# Patient Record
Sex: Female | Born: 1985 | Race: Black or African American | Hispanic: No | Marital: Single | Smoking: Former smoker
Health system: Southern US, Community
[De-identification: ages and names within clinical notes are randomized; demographics above are authoritative.]

## PROBLEM LIST (undated history)

## (undated) ENCOUNTER — Inpatient Hospital Stay (HOSPITAL_COMMUNITY): Payer: Self-pay

## (undated) DIAGNOSIS — R51 Headache: Secondary | ICD-10-CM

## (undated) DIAGNOSIS — I10 Essential (primary) hypertension: Secondary | ICD-10-CM

## (undated) DIAGNOSIS — E119 Type 2 diabetes mellitus without complications: Secondary | ICD-10-CM

## (undated) DIAGNOSIS — G4730 Sleep apnea, unspecified: Secondary | ICD-10-CM

## (undated) DIAGNOSIS — Z789 Other specified health status: Secondary | ICD-10-CM

## (undated) DIAGNOSIS — K8020 Calculus of gallbladder without cholecystitis without obstruction: Secondary | ICD-10-CM

## (undated) DIAGNOSIS — I82409 Acute embolism and thrombosis of unspecified deep veins of unspecified lower extremity: Secondary | ICD-10-CM

## (undated) HISTORY — DX: Type 2 diabetes mellitus without complications: E11.9

## (undated) HISTORY — DX: Essential (primary) hypertension: I10

---
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
## 2006-07-24 ENCOUNTER — Emergency Department (HOSPITAL_COMMUNITY): Admission: EM | Admit: 2006-07-24 | Discharge: 2006-07-24 | Payer: Self-pay | Admitting: Family Medicine

## 2010-10-19 ENCOUNTER — Inpatient Hospital Stay (INDEPENDENT_AMBULATORY_CARE_PROVIDER_SITE_OTHER)
Admission: RE | Admit: 2010-10-19 | Discharge: 2010-10-19 | Disposition: A | Discharge status: Home / Self Care | Payer: Self-pay | Source: Ambulatory Visit | Attending: Family Medicine | Admitting: Family Medicine

## 2010-10-19 DIAGNOSIS — J020 Streptococcal pharyngitis: Secondary | ICD-10-CM

## 2010-10-19 LAB — POCT RAPID STREP A: Streptococcus, Group A Screen (Direct): POSITIVE — AB

## 2011-06-28 ENCOUNTER — Inpatient Hospital Stay (HOSPITAL_COMMUNITY)
Admission: AD | Admit: 2011-06-28 | Discharge: 2011-06-28 | Disposition: A | Discharge status: Home / Self Care | Payer: Medicaid Other | Source: Ambulatory Visit | Attending: Obstetrics & Gynecology | Admitting: Obstetrics & Gynecology

## 2011-06-28 ENCOUNTER — Encounter (HOSPITAL_COMMUNITY): Payer: Self-pay

## 2011-06-28 DIAGNOSIS — Z331 Pregnant state, incidental: Secondary | ICD-10-CM

## 2011-06-28 DIAGNOSIS — Z3201 Encounter for pregnancy test, result positive: Secondary | ICD-10-CM | POA: Insufficient documentation

## 2011-06-28 DIAGNOSIS — Z3490 Encounter for supervision of normal pregnancy, unspecified, unspecified trimester: Secondary | ICD-10-CM

## 2011-06-28 HISTORY — DX: Other specified health status: Z78.9

## 2011-06-28 LAB — POCT PREGNANCY, URINE: Preg Test, Ur: POSITIVE — AB

## 2011-06-28 NOTE — MAU Provider Note (Signed)
  History     CSN: [id number]  Arrival date and time: 06/28/11 7829   None     Chief Complaint  Patient presents with  . Needs pregnancy test    HPI Pt presents for verification of pregnancy.  Pt is not having any spotting, bleeding or cramping or pregnancy complaints  Past Medical History  Diagnosis Date  . No pertinent past medical history     Past Surgical History  Procedure Date  . No past surgeries     Family History  Problem Relation Age of Onset  . Anesthesia problems Neg Hx   . Hypotension Neg Hx   . Malignant hyperthermia Neg Hx   . Pseudochol deficiency Neg Hx     History  Substance Use Topics  . Smoking status: Never Smoker   . Smokeless tobacco: Never Used  . Alcohol Use: No    Allergies: No Known Allergies  No prescriptions prior to admission    ROS Physical Exam   Blood pressure 139/80, pulse 87, temperature 98.7 F (37.1 C), temperature source Oral, resp. rate 16, height 5' 11.5" (1.816 m), weight 325 lb (147.419 kg), last menstrual period 05/12/2011, SpO2 100.00%.  Physical Exam  MAU Course  Procedures     Assessment and Plan  Pregnancy verification [redacted]w[redacted]d F/u OB care  Letasha Kershaw 06/28/2011, 8:15 AM

## 2011-06-28 NOTE — MAU Note (Signed)
Pt states LMP-05/12/2011, had +upt Saturday and Sunday. Denies vaginal d/c changes or bleeding. Needs pregnancy confirmation letter only.

## 2011-06-28 NOTE — Discharge Instructions (Signed)
________________________________________     To schedule your Maternity Eligibility Appointment, please call [phone].  When you arrive for your appointment you must bring the following items or information listed below.  Your appointment will be rescheduled if you do not have these items or are 15 minutes late. If currently receiving Medicaid, you MUST bring: 1. Medicaid Card 2. Social Security Card 3. Picture ID 4. Proof of Pregnancy 5. Verification of current address if the address on Medicaid card is incorrect "postmarked mail" If not receiving Medicaid, you MUST bring: 1. Social Security Card 2. Picture ID 3. Birth Certificate (if available) Passport or *Green Card 4. Proof of Pregnancy 5. Verification of current address "postmarked mail" for each income presented. 6. Verification of insurance coverage, if any 7. Check stubs from each employer for the previous month (if unable to present check stub  for each week, we will accept check stub for the first and last week ill the same month.) If you can't locate check stubs, you must bring a letter from the employer(s) and it must have the following information on letterhead, typed, in English: o name of company o company telephone number o how long been with the company, if less than one month o how much person earns per hour o how many hours per week work o the gross pay the person earned for the previous month If you are 27 years old or less, you do not have to bring proof of income unless you work or live with the father of the baby and at that time we will need proof of income from you and/or the father of the baby. Green Card recipients are eligible for Medicaid for Pregnant Women (MPW)  ABCs of Pregnancy A Antepartum care is very important. Be sure you see your doctor and get prenatal care as soon as you think you are pregnant. At this time, you will be tested for infection, genetic abnormalities and potential problems  with you and the pregnancy. This is the time to discuss diet, exercise, work, medications, labor, pain medication during labor and the possibility of a cesarean delivery. Ask any questions that may concern you. It is important to see your doctor regularly throughout your pregnancy. Avoid exposure to toxic substances and chemicals - such as cleaning solvents, lead and mercury, some insecticides, and paint. Pregnant women should avoid exposure to paint fumes, and fumes that cause you to feel ill, dizzy or faint. When possible, it is a good idea to have a pre-pregnancy consultation with your caregiver to begin some important recommendations your caregiver suggests such as, taking folic acid, exercising, quitting smoking, avoiding alcoholic beverages, etc. B Breastfeeding is the healthiest choice for both you and your baby. It has many nutritional benefits for the baby and health benefits for the mother. It also creates a very tight and loving bond between the baby and mother. Talk to your doctor, your family and friends, and your employer about how you choose to feed your baby and how they can support you in your decision. Not all birth defects can be prevented, but a woman can take actions that may increase her chance of having a healthy baby. Many birth defects happen very early in pregnancy, sometimes before a woman even knows she is pregnant. Birth defects or abnormalities of any child in your or the father's family should be discussed with your caregiver. Get a good support bra as your breast size changes. Wear it especially when you exercise and  when nursing.  C Celebrate the news of your pregnancy with the your spouse/father and family. Childbirth classes are helpful to take for you and the spouse/father because it helps to understand what happens during the pregnancy, labor and delivery. Cesarean delivery should be discussed with your doctor so you are prepared for that possibility. The pros and cons of  circumcision if it is a boy, should be discussed with your pediatrician. Cigarette smoking during pregnancy can result in low birth weight babies. It has been associated with infertility, miscarriages, tubal pregnancies, infant death (mortality) and poor health (morbidity) in childhood. Additionally, cigarette smoking may cause long-term learning disabilities. If you smoke, you should try to quit before getting pregnant and not smoke during the pregnancy. Secondary smoke may also harm a mother and her developing baby. It is a good idea to ask people to stop smoking around you during your pregnancy and after the baby is born. Extra calcium is necessary when you are pregnant and is found in your prenatal vitamin, in dairy products, green leafy vegetables and in calcium supplements. D A healthy diet according to your current weight and height, along with vitamins and mineral supplements should be discussed with your caregiver. Domestic abuse or violence should be made known to your doctor right away to get the situation corrected. Drink more water when you exercise to keep hydrated. Discomfort of your back and legs usually develops and progresses from the middle of the second trimester through to delivery of the baby. This is because of the enlarging baby and uterus, which may also affect your balance. Do not take illegal drugs. Illegal drugs can seriously harm the baby and you. Drink extra fluids (water is best) throughout pregnancy to help your body keep up with the increases in your blood volume. Drink at least 6 to 8 glasses of water, fruit juice, or milk each day. A good way to know you are drinking enough fluid is when your urine looks almost like clear water or is very light yellow.  E Eat healthy to get the nutrients you and your unborn baby need. Your meals should include the five basic food groups. Exercise (30 minutes of light to moderate exercise a day) is important and encouraged during pregnancy, if  there are no medical problems or problems with the pregnancy. Exercise that causes discomfort or dizziness should be stopped and reported to your caregiver. Emotions during pregnancy can change from being ecstatic to depression and should be understood by you, your partner and your family. F Fetal screening with ultrasound, amniocentesis and monitoring during pregnancy and labor is common and sometimes necessary. Take 400 micrograms of folic acid daily both before, when possible, and during the first few months of pregnancy to reduce the risk of birth defects of the brain and spine. All women who could possibly become pregnant should take a vitamin with folic acid, every day. It is also important to eat a healthy diet with fortified foods (enriched grain products, including cereals, rice, breads, and pastas) and foods with natural sources of folate (orange juice, green leafy vegetables, beans, peanuts, broccoli, asparagus, peas, and lentils). The father should be involved with all aspects of the pregnancy including, the prenatal care, childbirth classes, labor, delivery, and postpartum time. Fathers may also have emotional concerns about being a father, financial needs, and raising a family. G Genetic testing should be done appropriately. It is important to know your family and the father's history. If there have been problems with pregnancies  or birth defects in your family, report these to your doctor. Also, genetic counselors can talk with you about the information you might need in making decisions about having a family. You can call a major medical center in your area for help in finding a board-certified genetic counselor. Genetic testing and counseling should be done before pregnancy when possible, especially if there is a history of problems in the mother's or father's family. Certain ethnic backgrounds are more at risk for genetic defects. H Get familiar with the hospital where you will be having your  baby. Get to know how long it takes to get there, the labor and delivery area, and the hospital procedures. Be sure your medical insurance is accepted there. Get your home ready for the baby including, clothes, the baby's room (when possible), furniture and car seat. Hand washing is important throughout the day, especially after handling raw meat and poultry, changing the baby's diaper or using the bathroom. This can help prevent the spread of many bacteria and viruses that cause infection. Your hair may become dry and thinner, but will return to normal a few weeks after the baby is born. Heartburn is a common problem that can be treated by taking antacids recommended by your caregiver, eating smaller meals 5 or 6 times a day, not drinking liquids when eating, drinking between meals and raising the head of your bed 2 to 3 inches. I Insurance to cover you, the baby, doctor and hospital should be reviewed so that you will be prepared to pay any costs not covered by your insurance plan. If you do not have medical insurance, there are usually clinics and services available for you in your community. Take 30 milligrams of iron during your pregnancy as prescribed by your doctor to reduce the risk of low red blood cells (anemia) later in pregnancy. All women of childbearing age should eat a diet rich in iron. J There should be a joint effort for the mother, father and any other children to adapt to the pregnancy financially, emotionally, and psychologically during the pregnancy. Join a support group for moms-to-be. Or, join a class on parenting or childbirth. Have the family participate when possible. K Know your limits. Let your caregiver know if you experience any of the following:   Pain of any kind.   Strong cramps.   You develop a lot of weight in a short period of time (5 pounds in 3 to 5 days).   Vaginal bleeding, leaking of amniotic fluid.   Headache, vision problems.   Dizziness, fainting,  shortness of breath.   Chest pain.   Fever of 102 F (38.9 C) or higher.   Gush of clear fluid from your vagina.   Painful urination.   Domestic violence.   Irregular heartbeat (palpitations).   Rapid beating of the heart (tachycardia).   Constant feeling sick to your stomach (nauseous) and vomiting.   Trouble walking, fluid retention (edema).   Muscle weakness.   If your baby has decreased activity.   Persistent diarrhea.   Abnormal vaginal discharge.   Uterine contractions at 20-minute intervals.   Back pain that travels down your leg.  L Learn and practice that what you eat and drink should be in moderation and healthy for you and your baby. Legal drugs such as alcohol and caffeine are important issues for pregnant women. There is no safe amount of alcohol a woman can drink while pregnant. Fetal alcohol syndrome, a disorder characterized by growth retardation, facial  abnormalities, and central nervous system dysfunction, is caused by a woman's use of alcohol during pregnancy. Caffeine, found in tea, coffee, soft drinks and chocolate, should also be limited. Be sure to read labels when trying to cut down on caffeine during pregnancy. More than 200 foods, beverages, and over-the-counter medications contain caffeine and have a high salt content! There are coffees and teas that do not contain caffeine. M Medical conditions such as diabetes, epilepsy, and high blood pressure should be treated and kept under control before pregnancy when possible, but especially during pregnancy. Ask your caregiver about any medications that may need to be changed or adjusted during pregnancy. If you are currently taking any medications, ask your caregiver if it is safe to take them while you are pregnant or before getting pregnant when possible. Also, be sure to discuss any herbs or vitamins you are taking. They are medicines, too! Discuss with your doctor all medications, prescribed and  over-the-counter, that you are taking. During your prenatal visit, discuss the medications your doctor may give you during labor and delivery. N Never be afraid to ask your doctor or caregiver questions about your health, the progress of the pregnancy, family problems, stressful situations, and recommendation for a pediatrician, if you do not have one. It is better to take all precautions and discuss any questions or concerns you may have during your office visits. It is a good idea to write down your questions before you visit the doctor. O Over-the-counter cough and cold remedies may contain alcohol or other ingredients that should be avoided during pregnancy. Ask your caregiver about prescription, herbs or over-the-counter medications that you are taking or may consider taking while pregnant.  P Physical activity during pregnancy can benefit both you and your baby by lessening discomfort and fatigue, providing a sense of well-being, and increasing the likelihood of early recovery after delivery. Light to moderate exercise during pregnancy strengthens the belly (abdominal) and back muscles. This helps improve posture. Practicing yoga, walking, swimming, and cycling on a stationary bicycle are usually safe exercises for pregnant women. Avoid scuba diving, exercise at high altitudes (over 3000 feet), skiing, horseback riding, contact sports, etc. Always check with your doctor before beginning any kind of exercise, especially during pregnancy and especially if you did not exercise before getting pregnant. Q Queasiness, stomach upset and morning sickness are common during pregnancy. Eating a couple of crackers or dry toast before getting out of bed. Foods that you normally love may make you feel sick to your stomach. You may need to substitute other nutritious foods. Eating 5 or 6 small meals a day instead of 3 large ones may make you feel better. Do not drink with your meals, drink between meals. Questions  that you have should be written down and asked during your prenatal visits. R Read about and make plans to baby-proof your home. There are important tips for making your home a safer environment for your baby. Review the tips and make your home safer for you and your baby. Read food labels regarding calories, salt and fat content in the food. S Saunas, hot tubs, and steam rooms should be avoided while you are pregnant. Excessive high heat may be harmful during your pregnancy. Your caregiver will screen and examine you for sexually transmitted diseases and genetic disorders during your prenatal visits. Learn the signs of labor. Sexual relations while pregnant is safe unless there is a medical or pregnancy problem and your caregiver advises against it. T Traveling  long distances should be avoided especially in the third trimester of your pregnancy. If you do have to travel out of state, be sure to take a copy of your medical records and medical insurance plan with you. You should not travel long distances without seeing your doctor first. Most airlines will not allow you to travel after 36 weeks of pregnancy. Toxoplasmosis is an infection caused by a parasite that can seriously harm an unborn baby. Avoid eating undercooked meat and handling cat litter. Be sure to wear gloves when gardening. Tingling of the hands and fingers is not unusual and is due to fluid retention. This will go away after the baby is born. U Womb (uterus) size increases during the first trimester. Your kidneys will begin to function more efficiently. This may cause you to feel the need to urinate more often. You may also leak urine when sneezing, coughing or laughing. This is due to the growing uterus pressing against your bladder, which lies directly in front of and slightly under the uterus during the first few months of pregnancy. If you experience burning along with frequency of urination or bloody urine, be sure to tell your doctor.  The size of your uterus in the third trimester may cause a problem with your balance. It is advisable to maintain good posture and avoid wearing high heels during this time. An ultrasound of your baby may be necessary during your pregnancy and is safe for you and your baby. V Vaccinations are an important concern for pregnant women. Get needed vaccines before pregnancy. Center for Disease Control ([employer]) has clear guidelines for the use of vaccines during pregnancy. Review the list, be sure to discuss it with your doctor. Prenatal vitamins are helpful and healthy for you and the baby. Do not take extra vitamins except what is recommended. Taking too much of certain vitamins can cause overdose problems. Continuous vomiting should be reported to your caregiver. Varicose veins may appear especially if there is a family history of varicose veins. They should subside after the delivery of the baby. Support hose helps if there is leg discomfort. W Being overweight or underweight during pregnancy may cause problems. Try to get within 15 pounds of your ideal weight before pregnancy. Remember, pregnancy is not a time to be dieting! Do not stop eating or start skipping meals as your weight increases. Both you and your baby need the calories and nutrition you receive from a healthy diet. Be sure to consult with your doctor about your diet. There is a formula and diet plan available depending on whether you are overweight or underweight. Your caregiver or nutritionist can help and advise you if necessary. X Avoid X-rays. If you must have dental work or diagnostic tests, tell your dentist or physician that you are pregnant so that extra care can be taken. X-rays should only be taken when the risks of not taking them outweigh the risk of taking them. If needed, only the minimum amount of radiation should be used. When X-rays are necessary, protective lead shields should be used to cover areas of the body that are not  being X-rayed. Y Your baby loves you. Breastfeeding your baby creates a loving and very close bond between the two of you. Give your baby a healthy environment to live in while you are pregnant. Infants and children require constant care and guidance. Their health and safety should be carefully watched at all times. After the baby is born, rest or take a nap when  the baby is sleeping. Z Get your ZZZs. Be sure to get plenty of rest. Resting on your side as often as possible, especially on your left side is advised. It provides the best circulation to your baby and helps reduce swelling. Try taking a nap for 30 to 45 minutes in the afternoon when possible. After the baby is born rest or take a nap when the baby is sleeping. Try elevating your feet for that amount of time when possible. It helps the circulation in your legs and helps reduce swelling.  Most information courtesy of the CDC. Document Released: 01/09/2005 Document Revised: 12/29/2010 Document Reviewed: 09/23/2008 [facility] Patient Information 2012 [place], [place].ABCs of Pregnancy A Antepartum care is very important. Be sure you see your doctor and get prenatal care as soon as you think you are pregnant. At this time, you will be tested for infection, genetic abnormalities and potential problems with you and the pregnancy. This is the time to discuss diet, exercise, work, medications, labor, pain medication during labor and the possibility of a cesarean delivery. Ask any questions that may concern you. It is important to see your doctor regularly throughout your pregnancy. Avoid exposure to toxic substances and chemicals - such as cleaning solvents, lead and mercury, some insecticides, and paint. Pregnant women should avoid exposure to paint fumes, and fumes that cause you to feel ill, dizzy or faint. When possible, it is a good idea to have a pre-pregnancy consultation with your caregiver to begin some important recommendations your caregiver  suggests such as, taking folic acid, exercising, quitting smoking, avoiding alcoholic beverages, etc. B Breastfeeding is the healthiest choice for both you and your baby. It has many nutritional benefits for the baby and health benefits for the mother. It also creates a very tight and loving bond between the baby and mother. Talk to your doctor, your family and friends, and your employer about how you choose to feed your baby and how they can support you in your decision. Not all birth defects can be prevented, but a woman can take actions that may increase her chance of having a healthy baby. Many birth defects happen very early in pregnancy, sometimes before a woman even knows she is pregnant. Birth defects or abnormalities of any child in your or the father's family should be discussed with your caregiver. Get a good support bra as your breast size changes. Wear it especially when you exercise and when nursing.  C Celebrate the news of your pregnancy with the your spouse/father and family. Childbirth classes are helpful to take for you and the spouse/father because it helps to understand what happens during the pregnancy, labor and delivery. Cesarean delivery should be discussed with your doctor so you are prepared for that possibility. The pros and cons of circumcision if it is a boy, should be discussed with your pediatrician. Cigarette smoking during pregnancy can result in low birth weight babies. It has been associated with infertility, miscarriages, tubal pregnancies, infant death (mortality) and poor health (morbidity) in childhood. Additionally, cigarette smoking may cause long-term learning disabilities. If you smoke, you should try to quit before getting pregnant and not smoke during the pregnancy. Secondary smoke may also harm a mother and her developing baby. It is a good idea to ask people to stop smoking around you during your pregnancy and after the baby is born. Extra calcium is necessary when  you are pregnant and is found in your prenatal vitamin, in dairy products, green leafy vegetables and  in calcium supplements. D A healthy diet according to your current weight and height, along with vitamins and mineral supplements should be discussed with your caregiver. Domestic abuse or violence should be made known to your doctor right away to get the situation corrected. Drink more water when you exercise to keep hydrated. Discomfort of your back and legs usually develops and progresses from the middle of the second trimester through to delivery of the baby. This is because of the enlarging baby and uterus, which may also affect your balance. Do not take illegal drugs. Illegal drugs can seriously harm the baby and you. Drink extra fluids (water is best) throughout pregnancy to help your body keep up with the increases in your blood volume. Drink at least 6 to 8 glasses of water, fruit juice, or milk each day. A good way to know you are drinking enough fluid is when your urine looks almost like clear water or is very light yellow.  E Eat healthy to get the nutrients you and your unborn baby need. Your meals should include the five basic food groups. Exercise (30 minutes of light to moderate exercise a day) is important and encouraged during pregnancy, if there are no medical problems or problems with the pregnancy. Exercise that causes discomfort or dizziness should be stopped and reported to your caregiver. Emotions during pregnancy can change from being ecstatic to depression and should be understood by you, your partner and your family. F Fetal screening with ultrasound, amniocentesis and monitoring during pregnancy and labor is common and sometimes necessary. Take 400 micrograms of folic acid daily both before, when possible, and during the first few months of pregnancy to reduce the risk of birth defects of the brain and spine. All women who could possibly become pregnant should take a vitamin with  folic acid, every day. It is also important to eat a healthy diet with fortified foods (enriched grain products, including cereals, rice, breads, and pastas) and foods with natural sources of folate (orange juice, green leafy vegetables, beans, peanuts, broccoli, asparagus, peas, and lentils). The father should be involved with all aspects of the pregnancy including, the prenatal care, childbirth classes, labor, delivery, and postpartum time. Fathers may also have emotional concerns about being a father, financial needs, and raising a family. G Genetic testing should be done appropriately. It is important to know your family and the father's history. If there have been problems with pregnancies or birth defects in your family, report these to your doctor. Also, genetic counselors can talk with you about the information you might need in making decisions about having a family. You can call a major medical center in your area for help in finding a board-certified genetic counselor. Genetic testing and counseling should be done before pregnancy when possible, especially if there is a history of problems in the mother's or father's family. Certain ethnic backgrounds are more at risk for genetic defects. H Get familiar with the hospital where you will be having your baby. Get to know how long it takes to get there, the labor and delivery area, and the hospital procedures. Be sure your medical insurance is accepted there. Get your home ready for the baby including, clothes, the baby's room (when possible), furniture and car seat. Hand washing is important throughout the day, especially after handling raw meat and poultry, changing the baby's diaper or using the bathroom. This can help prevent the spread of many bacteria and viruses that cause infection. Your hair may become  dry and thinner, but will return to normal a few weeks after the baby is born. Heartburn is a common problem that can be treated by taking  antacids recommended by your caregiver, eating smaller meals 5 or 6 times a day, not drinking liquids when eating, drinking between meals and raising the head of your bed 2 to 3 inches. I Insurance to cover you, the baby, doctor and hospital should be reviewed so that you will be prepared to pay any costs not covered by your insurance plan. If you do not have medical insurance, there are usually clinics and services available for you in your community. Take 30 milligrams of iron during your pregnancy as prescribed by your doctor to reduce the risk of low red blood cells (anemia) later in pregnancy. All women of childbearing age should eat a diet rich in iron. J There should be a joint effort for the mother, father and any other children to adapt to the pregnancy financially, emotionally, and psychologically during the pregnancy. Join a support group for moms-to-be. Or, join a class on parenting or childbirth. Have the family participate when possible. K Know your limits. Let your caregiver know if you experience any of the following:   Pain of any kind.   Strong cramps.   You develop a lot of weight in a short period of time (5 pounds in 3 to 5 days).   Vaginal bleeding, leaking of amniotic fluid.   Headache, vision problems.   Dizziness, fainting, shortness of breath.   Chest pain.   Fever of 102 F (38.9 C) or higher.   Gush of clear fluid from your vagina.   Painful urination.   Domestic violence.   Irregular heartbeat (palpitations).   Rapid beating of the heart (tachycardia).   Constant feeling sick to your stomach (nauseous) and vomiting.   Trouble walking, fluid retention (edema).   Muscle weakness.   If your baby has decreased activity.   Persistent diarrhea.   Abnormal vaginal discharge.   Uterine contractions at 20-minute intervals.   Back pain that travels down your leg.  L Learn and practice that what you eat and drink should be in moderation and healthy  for you and your baby. Legal drugs such as alcohol and caffeine are important issues for pregnant women. There is no safe amount of alcohol a woman can drink while pregnant. Fetal alcohol syndrome, a disorder characterized by growth retardation, facial abnormalities, and central nervous system dysfunction, is caused by a woman's use of alcohol during pregnancy. Caffeine, found in tea, coffee, soft drinks and chocolate, should also be limited. Be sure to read labels when trying to cut down on caffeine during pregnancy. More than 200 foods, beverages, and over-the-counter medications contain caffeine and have a high salt content! There are coffees and teas that do not contain caffeine. M Medical conditions such as diabetes, epilepsy, and high blood pressure should be treated and kept under control before pregnancy when possible, but especially during pregnancy. Ask your caregiver about any medications that may need to be changed or adjusted during pregnancy. If you are currently taking any medications, ask your caregiver if it is safe to take them while you are pregnant or before getting pregnant when possible. Also, be sure to discuss any herbs or vitamins you are taking. They are medicines, too! Discuss with your doctor all medications, prescribed and over-the-counter, that you are taking. During your prenatal visit, discuss the medications your doctor may give you during labor and  delivery. N Never be afraid to ask your doctor or caregiver questions about your health, the progress of the pregnancy, family problems, stressful situations, and recommendation for a pediatrician, if you do not have one. It is better to take all precautions and discuss any questions or concerns you may have during your office visits. It is a good idea to write down your questions before you visit the doctor. O Over-the-counter cough and cold remedies may contain alcohol or other ingredients that should be avoided during pregnancy.  Ask your caregiver about prescription, herbs or over-the-counter medications that you are taking or may consider taking while pregnant.  P Physical activity during pregnancy can benefit both you and your baby by lessening discomfort and fatigue, providing a sense of well-being, and increasing the likelihood of early recovery after delivery. Light to moderate exercise during pregnancy strengthens the belly (abdominal) and back muscles. This helps improve posture. Practicing yoga, walking, swimming, and cycling on a stationary bicycle are usually safe exercises for pregnant women. Avoid scuba diving, exercise at high altitudes (over 3000 feet), skiing, horseback riding, contact sports, etc. Always check with your doctor before beginning any kind of exercise, especially during pregnancy and especially if you did not exercise before getting pregnant. Q Queasiness, stomach upset and morning sickness are common during pregnancy. Eating a couple of crackers or dry toast before getting out of bed. Foods that you normally love may make you feel sick to your stomach. You may need to substitute other nutritious foods. Eating 5 or 6 small meals a day instead of 3 large ones may make you feel better. Do not drink with your meals, drink between meals. Questions that you have should be written down and asked during your prenatal visits. R Read about and make plans to baby-proof your home. There are important tips for making your home a safer environment for your baby. Review the tips and make your home safer for you and your baby. Read food labels regarding calories, salt and fat content in the food. S Saunas, hot tubs, and steam rooms should be avoided while you are pregnant. Excessive high heat may be harmful during your pregnancy. Your caregiver will screen and examine you for sexually transmitted diseases and genetic disorders during your prenatal visits. Learn the signs of labor. Sexual relations while pregnant is  safe unless there is a medical or pregnancy problem and your caregiver advises against it. T Traveling long distances should be avoided especially in the third trimester of your pregnancy. If you do have to travel out of state, be sure to take a copy of your medical records and medical insurance plan with you. You should not travel long distances without seeing your doctor first. Most airlines will not allow you to travel after 36 weeks of pregnancy. Toxoplasmosis is an infection caused by a parasite that can seriously harm an unborn baby. Avoid eating undercooked meat and handling cat litter. Be sure to wear gloves when gardening. Tingling of the hands and fingers is not unusual and is due to fluid retention. This will go away after the baby is born. U Womb (uterus) size increases during the first trimester. Your kidneys will begin to function more efficiently. This may cause you to feel the need to urinate more often. You may also leak urine when sneezing, coughing or laughing. This is due to the growing uterus pressing against your bladder, which lies directly in front of and slightly under the uterus during the first few months  of pregnancy. If you experience burning along with frequency of urination or bloody urine, be sure to tell your doctor. The size of your uterus in the third trimester may cause a problem with your balance. It is advisable to maintain good posture and avoid wearing high heels during this time. An ultrasound of your baby may be necessary during your pregnancy and is safe for you and your baby. V Vaccinations are an important concern for pregnant women. Get needed vaccines before pregnancy. Center for Disease Control ([employer]) has clear guidelines for the use of vaccines during pregnancy. Review the list, be sure to discuss it with your doctor. Prenatal vitamins are helpful and healthy for you and the baby. Do not take extra vitamins except what is recommended. Taking too much of  certain vitamins can cause overdose problems. Continuous vomiting should be reported to your caregiver. Varicose veins may appear especially if there is a family history of varicose veins. They should subside after the delivery of the baby. Support hose helps if there is leg discomfort. W Being overweight or underweight during pregnancy may cause problems. Try to get within 15 pounds of your ideal weight before pregnancy. Remember, pregnancy is not a time to be dieting! Do not stop eating or start skipping meals as your weight increases. Both you and your baby need the calories and nutrition you receive from a healthy diet. Be sure to consult with your doctor about your diet. There is a formula and diet plan available depending on whether you are overweight or underweight. Your caregiver or nutritionist can help and advise you if necessary. X Avoid X-rays. If you must have dental work or diagnostic tests, tell your dentist or physician that you are pregnant so that extra care can be taken. X-rays should only be taken when the risks of not taking them outweigh the risk of taking them. If needed, only the minimum amount of radiation should be used. When X-rays are necessary, protective lead shields should be used to cover areas of the body that are not being X-rayed. Y Your baby loves you. Breastfeeding your baby creates a loving and very close bond between the two of you. Give your baby a healthy environment to live in while you are pregnant. Infants and children require constant care and guidance. Their health and safety should be carefully watched at all times. After the baby is born, rest or take a nap when the baby is sleeping. Z Get your ZZZs. Be sure to get plenty of rest. Resting on your side as often as possible, especially on your left side is advised. It provides the best circulation to your baby and helps reduce swelling. Try taking a nap for 30 to 45 minutes in the afternoon when possible. After  the baby is born rest or take a nap when the baby is sleeping. Try elevating your feet for that amount of time when possible. It helps the circulation in your legs and helps reduce swelling.  Most information courtesy of the CDC. Document Released: 01/09/2005 Document Revised: 12/29/2010 Document Reviewed: 09/23/2008 [facility] Patient Information 2012 [place], [place].ABCs of Pregnancy A Antepartum care is very important. Be sure you see your doctor and get prenatal care as soon as you think you are pregnant. At this time, you will be tested for infection, genetic abnormalities and potential problems with you and the pregnancy. This is the time to discuss diet, exercise, work, medications, labor, pain medication during labor and the possibility of a cesarean delivery.  Ask any questions that may concern you. It is important to see your doctor regularly throughout your pregnancy. Avoid exposure to toxic substances and chemicals - such as cleaning solvents, lead and mercury, some insecticides, and paint. Pregnant women should avoid exposure to paint fumes, and fumes that cause you to feel ill, dizzy or faint. When possible, it is a good idea to have a pre-pregnancy consultation with your caregiver to begin some important recommendations your caregiver suggests such as, taking folic acid, exercising, quitting smoking, avoiding alcoholic beverages, etc. B Breastfeeding is the healthiest choice for both you and your baby. It has many nutritional benefits for the baby and health benefits for the mother. It also creates a very tight and loving bond between the baby and mother. Talk to your doctor, your family and friends, and your employer about how you choose to feed your baby and how they can support you in your decision. Not all birth defects can be prevented, but a woman can take actions that may increase her chance of having a healthy baby. Many birth defects happen very early in pregnancy, sometimes before a  woman even knows she is pregnant. Birth defects or abnormalities of any child in your or the father's family should be discussed with your caregiver. Get a good support bra as your breast size changes. Wear it especially when you exercise and when nursing.  C Celebrate the news of your pregnancy with the your spouse/father and family. Childbirth classes are helpful to take for you and the spouse/father because it helps to understand what happens during the pregnancy, labor and delivery. Cesarean delivery should be discussed with your doctor so you are prepared for that possibility. The pros and cons of circumcision if it is a boy, should be discussed with your pediatrician. Cigarette smoking during pregnancy can result in low birth weight babies. It has been associated with infertility, miscarriages, tubal pregnancies, infant death (mortality) and poor health (morbidity) in childhood. Additionally, cigarette smoking may cause long-term learning disabilities. If you smoke, you should try to quit before getting pregnant and not smoke during the pregnancy. Secondary smoke may also harm a mother and her developing baby. It is a good idea to ask people to stop smoking around you during your pregnancy and after the baby is born. Extra calcium is necessary when you are pregnant and is found in your prenatal vitamin, in dairy products, green leafy vegetables and in calcium supplements. D A healthy diet according to your current weight and height, along with vitamins and mineral supplements should be discussed with your caregiver. Domestic abuse or violence should be made known to your doctor right away to get the situation corrected. Drink more water when you exercise to keep hydrated. Discomfort of your back and legs usually develops and progresses from the middle of the second trimester through to delivery of the baby. This is because of the enlarging baby and uterus, which may also affect your balance. Do not take  illegal drugs. Illegal drugs can seriously harm the baby and you. Drink extra fluids (water is best) throughout pregnancy to help your body keep up with the increases in your blood volume. Drink at least 6 to 8 glasses of water, fruit juice, or milk each day. A good way to know you are drinking enough fluid is when your urine looks almost like clear water or is very light yellow.  E Eat healthy to get the nutrients you and your unborn baby need. Your meals should  include the five basic food groups. Exercise (30 minutes of light to moderate exercise a day) is important and encouraged during pregnancy, if there are no medical problems or problems with the pregnancy. Exercise that causes discomfort or dizziness should be stopped and reported to your caregiver. Emotions during pregnancy can change from being ecstatic to depression and should be understood by you, your partner and your family. F Fetal screening with ultrasound, amniocentesis and monitoring during pregnancy and labor is common and sometimes necessary. Take 400 micrograms of folic acid daily both before, when possible, and during the first few months of pregnancy to reduce the risk of birth defects of the brain and spine. All women who could possibly become pregnant should take a vitamin with folic acid, every day. It is also important to eat a healthy diet with fortified foods (enriched grain products, including cereals, rice, breads, and pastas) and foods with natural sources of folate (orange juice, green leafy vegetables, beans, peanuts, broccoli, asparagus, peas, and lentils). The father should be involved with all aspects of the pregnancy including, the prenatal care, childbirth classes, labor, delivery, and postpartum time. Fathers may also have emotional concerns about being a father, financial needs, and raising a family. G Genetic testing should be done appropriately. It is important to know your family and the father's history. If there  have been problems with pregnancies or birth defects in your family, report these to your doctor. Also, genetic counselors can talk with you about the information you might need in making decisions about having a family. You can call a major medical center in your area for help in finding a board-certified genetic counselor. Genetic testing and counseling should be done before pregnancy when possible, especially if there is a history of problems in the mother's or father's family. Certain ethnic backgrounds are more at risk for genetic defects. H Get familiar with the hospital where you will be having your baby. Get to know how long it takes to get there, the labor and delivery area, and the hospital procedures. Be sure your medical insurance is accepted there. Get your home ready for the baby including, clothes, the baby's room (when possible), furniture and car seat. Hand washing is important throughout the day, especially after handling raw meat and poultry, changing the baby's diaper or using the bathroom. This can help prevent the spread of many bacteria and viruses that cause infection. Your hair may become dry and thinner, but will return to normal a few weeks after the baby is born. Heartburn is a common problem that can be treated by taking antacids recommended by your caregiver, eating smaller meals 5 or 6 times a day, not drinking liquids when eating, drinking between meals and raising the head of your bed 2 to 3 inches. I Insurance to cover you, the baby, doctor and hospital should be reviewed so that you will be prepared to pay any costs not covered by your insurance plan. If you do not have medical insurance, there are usually clinics and services available for you in your community. Take 30 milligrams of iron during your pregnancy as prescribed by your doctor to reduce the risk of low red blood cells (anemia) later in pregnancy. All women of childbearing age should eat a diet rich in  iron. J There should be a joint effort for the mother, father and any other children to adapt to the pregnancy financially, emotionally, and psychologically during the pregnancy. Join a support group for moms-to-be. Or, join  a class on parenting or childbirth. Have the family participate when possible. K Know your limits. Let your caregiver know if you experience any of the following:   Pain of any kind.   Strong cramps.   You develop a lot of weight in a short period of time (5 pounds in 3 to 5 days).   Vaginal bleeding, leaking of amniotic fluid.   Headache, vision problems.   Dizziness, fainting, shortness of breath.   Chest pain.   Fever of 102 F (38.9 C) or higher.   Gush of clear fluid from your vagina.   Painful urination.   Domestic violence.   Irregular heartbeat (palpitations).   Rapid beating of the heart (tachycardia).   Constant feeling sick to your stomach (nauseous) and vomiting.   Trouble walking, fluid retention (edema).   Muscle weakness.   If your baby has decreased activity.   Persistent diarrhea.   Abnormal vaginal discharge.   Uterine contractions at 20-minute intervals.   Back pain that travels down your leg.  L Learn and practice that what you eat and drink should be in moderation and healthy for you and your baby. Legal drugs such as alcohol and caffeine are important issues for pregnant women. There is no safe amount of alcohol a woman can drink while pregnant. Fetal alcohol syndrome, a disorder characterized by growth retardation, facial abnormalities, and central nervous system dysfunction, is caused by a woman's use of alcohol during pregnancy. Caffeine, found in tea, coffee, soft drinks and chocolate, should also be limited. Be sure to read labels when trying to cut down on caffeine during pregnancy. More than 200 foods, beverages, and over-the-counter medications contain caffeine and have a high salt content! There are coffees and teas  that do not contain caffeine. M Medical conditions such as diabetes, epilepsy, and high blood pressure should be treated and kept under control before pregnancy when possible, but especially during pregnancy. Ask your caregiver about any medications that may need to be changed or adjusted during pregnancy. If you are currently taking any medications, ask your caregiver if it is safe to take them while you are pregnant or before getting pregnant when possible. Also, be sure to discuss any herbs or vitamins you are taking. They are medicines, too! Discuss with your doctor all medications, prescribed and over-the-counter, that you are taking. During your prenatal visit, discuss the medications your doctor may give you during labor and delivery. N Never be afraid to ask your doctor or caregiver questions about your health, the progress of the pregnancy, family problems, stressful situations, and recommendation for a pediatrician, if you do not have one. It is better to take all precautions and discuss any questions or concerns you may have during your office visits. It is a good idea to write down your questions before you visit the doctor. O Over-the-counter cough and cold remedies may contain alcohol or other ingredients that should be avoided during pregnancy. Ask your caregiver about prescription, herbs or over-the-counter medications that you are taking or may consider taking while pregnant.  P Physical activity during pregnancy can benefit both you and your baby by lessening discomfort and fatigue, providing a sense of well-being, and increasing the likelihood of early recovery after delivery. Light to moderate exercise during pregnancy strengthens the belly (abdominal) and back muscles. This helps improve posture. Practicing yoga, walking, swimming, and cycling on a stationary bicycle are usually safe exercises for pregnant women. Avoid scuba diving, exercise at high altitudes (over 3000  feet), skiing,  horseback riding, contact sports, etc. Always check with your doctor before beginning any kind of exercise, especially during pregnancy and especially if you did not exercise before getting pregnant. Q Queasiness, stomach upset and morning sickness are common during pregnancy. Eating a couple of crackers or dry toast before getting out of bed. Foods that you normally love may make you feel sick to your stomach. You may need to substitute other nutritious foods. Eating 5 or 6 small meals a day instead of 3 large ones may make you feel better. Do not drink with your meals, drink between meals. Questions that you have should be written down and asked during your prenatal visits. R Read about and make plans to baby-proof your home. There are important tips for making your home a safer environment for your baby. Review the tips and make your home safer for you and your baby. Read food labels regarding calories, salt and fat content in the food. S Saunas, hot tubs, and steam rooms should be avoided while you are pregnant. Excessive high heat may be harmful during your pregnancy. Your caregiver will screen and examine you for sexually transmitted diseases and genetic disorders during your prenatal visits. Learn the signs of labor. Sexual relations while pregnant is safe unless there is a medical or pregnancy problem and your caregiver advises against it. T Traveling long distances should be avoided especially in the third trimester of your pregnancy. If you do have to travel out of state, be sure to take a copy of your medical records and medical insurance plan with you. You should not travel long distances without seeing your doctor first. Most airlines will not allow you to travel after 36 weeks of pregnancy. Toxoplasmosis is an infection caused by a parasite that can seriously harm an unborn baby. Avoid eating undercooked meat and handling cat litter. Be sure to wear gloves when gardening. Tingling of the hands  and fingers is not unusual and is due to fluid retention. This will go away after the baby is born. U Womb (uterus) size increases during the first trimester. Your kidneys will begin to function more efficiently. This may cause you to feel the need to urinate more often. You may also leak urine when sneezing, coughing or laughing. This is due to the growing uterus pressing against your bladder, which lies directly in front of and slightly under the uterus during the first few months of pregnancy. If you experience burning along with frequency of urination or bloody urine, be sure to tell your doctor. The size of your uterus in the third trimester may cause a problem with your balance. It is advisable to maintain good posture and avoid wearing high heels during this time. An ultrasound of your baby may be necessary during your pregnancy and is safe for you and your baby. V Vaccinations are an important concern for pregnant women. Get needed vaccines before pregnancy. Center for Disease Control ([employer]) has clear guidelines for the use of vaccines during pregnancy. Review the list, be sure to discuss it with your doctor. Prenatal vitamins are helpful and healthy for you and the baby. Do not take extra vitamins except what is recommended. Taking too much of certain vitamins can cause overdose problems. Continuous vomiting should be reported to your caregiver. Varicose veins may appear especially if there is a family history of varicose veins. They should subside after the delivery of the baby. Support hose helps if there is leg discomfort. W Being overweight  or underweight during pregnancy may cause problems. Try to get within 15 pounds of your ideal weight before pregnancy. Remember, pregnancy is not a time to be dieting! Do not stop eating or start skipping meals as your weight increases. Both you and your baby need the calories and nutrition you receive from a healthy diet. Be sure to consult with your  doctor about your diet. There is a formula and diet plan available depending on whether you are overweight or underweight. Your caregiver or nutritionist can help and advise you if necessary. X Avoid X-rays. If you must have dental work or diagnostic tests, tell your dentist or physician that you are pregnant so that extra care can be taken. X-rays should only be taken when the risks of not taking them outweigh the risk of taking them. If needed, only the minimum amount of radiation should be used. When X-rays are necessary, protective lead shields should be used to cover areas of the body that are not being X-rayed. Y Your baby loves you. Breastfeeding your baby creates a loving and very close bond between the two of you. Give your baby a healthy environment to live in while you are pregnant. Infants and children require constant care and guidance. Their health and safety should be carefully watched at all times. After the baby is born, rest or take a nap when the baby is sleeping. Z Get your ZZZs. Be sure to get plenty of rest. Resting on your side as often as possible, especially on your left side is advised. It provides the best circulation to your baby and helps reduce swelling. Try taking a nap for 30 to 45 minutes in the afternoon when possible. After the baby is born rest or take a nap when the baby is sleeping. Try elevating your feet for that amount of time when possible. It helps the circulation in your legs and helps reduce swelling.  Most information courtesy of the CDC. Document Released: 01/09/2005 Document Revised: 12/29/2010 Document Reviewed: 09/23/2008 [facility] Patient Information 2012 [place], [place].

## 2011-06-29 NOTE — MAU Provider Note (Signed)
Agree with above note.  Alexandrea Westergard 06/29/2011 1:16 PM   

## 2011-08-04 ENCOUNTER — Encounter: Payer: Self-pay | Admitting: Obstetrics and Gynecology

## 2011-08-07 ENCOUNTER — Encounter: Payer: Self-pay | Admitting: Obstetrics and Gynecology

## 2011-08-07 LAB — OB RESULTS CONSOLE GC/CHLAMYDIA
Chlamydia: NEGATIVE
Gonorrhea: NEGATIVE

## 2011-08-07 LAB — OB RESULTS CONSOLE ABO/RH

## 2011-08-07 LAB — OB RESULTS CONSOLE RUBELLA ANTIBODY, IGM: Rubella: IMMUNE

## 2011-08-07 LAB — OB RESULTS CONSOLE HIV ANTIBODY (ROUTINE TESTING): HIV: NONREACTIVE

## 2011-08-07 LAB — OB RESULTS CONSOLE RPR: RPR: NONREACTIVE

## 2011-11-07 ENCOUNTER — Other Ambulatory Visit: Payer: Self-pay | Admitting: Obstetrics and Gynecology

## 2011-11-07 ENCOUNTER — Ambulatory Visit
Admission: RE | Admit: 2011-11-07 | Discharge: 2011-11-07 | Disposition: A | Discharge status: Home / Self Care | Payer: BC Managed Care – PPO | Source: Ambulatory Visit | Attending: Obstetrics and Gynecology | Admitting: Obstetrics and Gynecology

## 2011-11-07 DIAGNOSIS — R1011 Right upper quadrant pain: Secondary | ICD-10-CM

## 2011-11-07 LAB — OB RESULTS CONSOLE HGB/HCT, BLOOD: HCT: 36 %

## 2011-11-07 LAB — OB RESULTS CONSOLE PLATELET COUNT: Platelets: 233 10*3/uL

## 2011-11-09 LAB — OB RESULTS CONSOLE GBS: GBS: POSITIVE

## 2012-01-24 NOTE — L&D Delivery Note (Signed)
Delivery Note At 3:04 AM a viable and healthy female was delivered via Vaginal, Spontaneous Delivery (Presentation: Left Occiput Anterior).  APGAR: 9, 9; weight .  pending Placenta status: Intact, Spontaneous short cord to path Pathology.  Cord: 3 vessels with the following complications: Short.  Cord pH: none  Anesthesia: Epidural  Episiotomy: None Lacerations:  Left Vaginal sulcus ;Labial minora bilateral; Suture Repair: 3.0 chromic Est. Blood Loss (mL): 300  Mom to postpartum.  Baby to nursery-stable.  Anayla Giannetti A 01/27/2012, 3:43 AM

## 2012-01-26 ENCOUNTER — Inpatient Hospital Stay (HOSPITAL_COMMUNITY): Payer: BC Managed Care – PPO | Admitting: Anesthesiology

## 2012-01-26 ENCOUNTER — Encounter (HOSPITAL_COMMUNITY): Payer: Self-pay | Admitting: *Deleted

## 2012-01-26 ENCOUNTER — Encounter (HOSPITAL_COMMUNITY): Payer: Self-pay | Admitting: Anesthesiology

## 2012-01-26 ENCOUNTER — Other Ambulatory Visit: Payer: Self-pay | Admitting: Obstetrics and Gynecology

## 2012-01-26 ENCOUNTER — Inpatient Hospital Stay (HOSPITAL_COMMUNITY)
Admission: AD | Admit: 2012-01-26 | Discharge: 2012-01-29 | DRG: 372 | Disposition: A | Discharge status: Home / Self Care | Payer: BC Managed Care – PPO | Source: Ambulatory Visit | Attending: Obstetrics and Gynecology | Admitting: Obstetrics and Gynecology

## 2012-01-26 DIAGNOSIS — O9882 Other maternal infectious and parasitic diseases complicating childbirth: Secondary | ICD-10-CM | POA: Diagnosis present

## 2012-01-26 DIAGNOSIS — A5901 Trichomonal vulvovaginitis: Secondary | ICD-10-CM | POA: Diagnosis present

## 2012-01-26 DIAGNOSIS — O99892 Other specified diseases and conditions complicating childbirth: Secondary | ICD-10-CM | POA: Diagnosis present

## 2012-01-26 DIAGNOSIS — IMO0001 Reserved for inherently not codable concepts without codable children: Secondary | ICD-10-CM

## 2012-01-26 DIAGNOSIS — A599 Trichomoniasis, unspecified: Secondary | ICD-10-CM | POA: Diagnosis present

## 2012-01-26 DIAGNOSIS — B951 Streptococcus, group B, as the cause of diseases classified elsewhere: Secondary | ICD-10-CM

## 2012-01-26 DIAGNOSIS — Z22330 Carrier of Group B streptococcus: Secondary | ICD-10-CM

## 2012-01-26 HISTORY — DX: Headache: R51

## 2012-01-26 LAB — COMPREHENSIVE METABOLIC PANEL
AST: 10 U/L (ref 0–37)
Albumin: 2.8 g/dL — ABNORMAL LOW (ref 3.5–5.2)
BUN: 3 mg/dL — ABNORMAL LOW (ref 6–23)
CO2: 19 mEq/L (ref 19–32)
Calcium: 10.7 mg/dL — ABNORMAL HIGH (ref 8.4–10.5)
Chloride: 104 mEq/L (ref 96–112)
Creatinine, Ser: 0.52 mg/dL (ref 0.50–1.10)
GFR calc non Af Amer: >90 mL/min (ref >90)
Total Bilirubin: 0.2 mg/dL — ABNORMAL LOW (ref 0.3–1.2)

## 2012-01-26 LAB — CBC
HCT: 38.6 % (ref 36.0–46.0)
MCHC: 33.4 g/dL (ref 30.0–36.0)
Platelets: 199 10*3/uL (ref 150–400)
RDW: 14.2 % (ref 11.5–15.5)
WBC: 14.8 10*3/uL — ABNORMAL HIGH (ref 4.0–10.5)

## 2012-01-26 LAB — URINALYSIS, DIPSTICK ONLY
Nitrite: NEGATIVE
Specific Gravity, Urine: 1.02 (ref 1.005–1.030)
Urobilinogen, UA: 0.2 mg/dL (ref 0.0–1.0)
pH: 6 (ref 5.0–8.0)

## 2012-01-26 LAB — TYPE AND SCREEN: ABO/RH(D): AB POS

## 2012-01-26 LAB — ABO/RH: ABO/RH(D): AB POS

## 2012-01-26 LAB — URIC ACID: Uric Acid, Serum: 4.9 mg/dL (ref 2.4–7.0)

## 2012-01-26 MED ORDER — CITRIC ACID-SODIUM CITRATE 334-500 MG/5ML PO SOLN: 30.0000 mL | ORAL | Status: DC | PRN

## 2012-01-26 MED ORDER — ACETAMINOPHEN 325 MG PO TABS: 650.0000 mg | ORAL_TABLET | ORAL | Status: DC | PRN

## 2012-01-26 MED ORDER — LIDOCAINE HCL (PF) 1 % IJ SOLN: 30.0000 mL | INTRAMUSCULAR | Status: DC | PRN

## 2012-01-26 MED ORDER — TERBUTALINE SULFATE 1 MG/ML IJ SOLN: 0.2500 mg | Freq: Once | INTRAMUSCULAR | Status: AC | PRN

## 2012-01-26 MED ORDER — LACTATED RINGERS IV SOLN
INTRAVENOUS | Status: DC
  Administered 2012-01-26: 13:00:00 via INTRAVENOUS

## 2012-01-26 MED ORDER — LIDOCAINE HCL (PF) 1 % IJ SOLN
INTRAMUSCULAR | Status: DC | PRN
  Administered 2012-01-26 (×2): 4 mL

## 2012-01-26 MED ORDER — IBUPROFEN 600 MG PO TABS: 600.0000 mg | ORAL_TABLET | Freq: Four times a day (QID) | ORAL | Status: DC | PRN

## 2012-01-26 MED ORDER — PHENYLEPHRINE 40 MCG/ML (10ML) SYRINGE FOR IV PUSH (FOR BLOOD PRESSURE SUPPORT)
80.0000 ug | PREFILLED_SYRINGE | INTRAVENOUS | Status: DC | PRN
  Filled 2012-01-26: qty 5

## 2012-01-26 MED ORDER — OXYTOCIN 40 UNITS IN LACTATED RINGERS INFUSION - SIMPLE MED
62.5000 mL/h | INTRAVENOUS | Status: DC
  Administered 2012-01-27: 62.5 mL/h via INTRAVENOUS

## 2012-01-26 MED ORDER — LACTATED RINGERS IV SOLN
500.0000 mL | Freq: Once | INTRAVENOUS | Status: AC
  Administered 2012-01-26: 500 mL via INTRAVENOUS

## 2012-01-26 MED ORDER — ONDANSETRON HCL 4 MG/2ML IJ SOLN
4.0000 mg | Freq: Four times a day (QID) | INTRAMUSCULAR | Status: DC | PRN
  Administered 2012-01-26: 4 mg via INTRAVENOUS
  Filled 2012-01-26: qty 2

## 2012-01-26 MED ORDER — EPHEDRINE 5 MG/ML INJ
10.0000 mg | INTRAVENOUS | Status: DC | PRN
  Filled 2012-01-26: qty 4

## 2012-01-26 MED ORDER — PENICILLIN G POTASSIUM 5000000 UNITS IJ SOLR
5.0000 10*6.[IU] | Freq: Once | INTRAVENOUS | Status: AC
  Administered 2012-01-26: 5 10*6.[IU] via INTRAVENOUS
  Filled 2012-01-26: qty 5

## 2012-01-26 MED ORDER — PENICILLIN G POTASSIUM 5000000 UNITS IJ SOLR
2.5000 10*6.[IU] | INTRAVENOUS | Status: DC
  Administered 2012-01-26 – 2012-01-27 (×3): 2.5 10*6.[IU] via INTRAVENOUS
  Filled 2012-01-26 (×7): qty 2.5

## 2012-01-26 MED ORDER — OXYTOCIN BOLUS FROM INFUSION: 500.0000 mL | INTRAVENOUS | Status: DC

## 2012-01-26 MED ORDER — OXYTOCIN 40 UNITS IN LACTATED RINGERS INFUSION - SIMPLE MED
1.0000 m[IU]/min | INTRAVENOUS | Status: DC
  Administered 2012-01-26: 2 m[IU]/min via INTRAVENOUS
  Filled 2012-01-26: qty 1000

## 2012-01-26 MED ORDER — METRONIDAZOLE IVPB CUSTOM: 2000.0000 mg | Freq: Once | INTRAVENOUS | Status: DC

## 2012-01-26 MED ORDER — OXYCODONE-ACETAMINOPHEN 5-325 MG PO TABS
1.0000 | ORAL_TABLET | ORAL | Status: DC | PRN
Start: 2012-01-26 — End: 2012-01-27

## 2012-01-26 MED ORDER — METRONIDAZOLE IN NACL 5-0.79 MG/ML-% IV SOLN
500.0000 mg | INTRAVENOUS | Status: AC
  Administered 2012-01-26 (×4): 500 mg via INTRAVENOUS
  Filled 2012-01-26 (×4): qty 100

## 2012-01-26 MED ORDER — LACTATED RINGERS IV SOLN
500.0000 mL | INTRAVENOUS | Status: DC | PRN
  Administered 2012-01-26: 1000 mL via INTRAVENOUS

## 2012-01-26 MED ORDER — FENTANYL 2.5 MCG/ML BUPIVACAINE 1/10 % EPIDURAL INFUSION (WH - ANES)
INTRAMUSCULAR | Status: DC | PRN
  Administered 2012-01-26: 16 mL/h via EPIDURAL

## 2012-01-26 MED ORDER — FENTANYL 2.5 MCG/ML BUPIVACAINE 1/10 % EPIDURAL INFUSION (WH - ANES)
14.0000 mL/h | INTRAMUSCULAR | Status: DC
  Administered 2012-01-27: 14 mL/h via EPIDURAL
  Filled 2012-01-26 (×2): qty 125

## 2012-01-26 MED ORDER — PHENYLEPHRINE 40 MCG/ML (10ML) SYRINGE FOR IV PUSH (FOR BLOOD PRESSURE SUPPORT): 80.0000 ug | PREFILLED_SYRINGE | INTRAVENOUS | Status: DC | PRN

## 2012-01-26 MED ORDER — OXYTOCIN 10 UNIT/ML IJ SOLN: 10.0000 [IU] | Freq: Once | INTRAMUSCULAR | Status: DC

## 2012-01-26 MED ORDER — DIPHENHYDRAMINE HCL 50 MG/ML IJ SOLN
12.5000 mg | INTRAMUSCULAR | Status: DC | PRN
  Administered 2012-01-26 (×2): 12.5 mg via INTRAVENOUS
  Filled 2012-01-26: qty 1

## 2012-01-26 MED ORDER — METRONIDAZOLE IVPB CUSTOM
2.0000 g | Freq: Once | INTRAVENOUS | Status: DC
  Filled 2012-01-26: qty 400

## 2012-01-26 MED ORDER — EPHEDRINE 5 MG/ML INJ: 10.0000 mg | INTRAVENOUS | Status: DC | PRN

## 2012-01-26 NOTE — Anesthesia Preprocedure Evaluation (Signed)
Anesthesia Evaluation  Patient identified by MRN, date of birth, ID band Patient awake    Reviewed: Allergy & Precautions, H&P , Patient's Chart, lab work & pertinent test results  Airway Mallampati: III TM Distance: >3 FB Neck ROM: full    Dental No notable dental hx. (+) Teeth Intact   Pulmonary neg pulmonary ROS,  breath sounds clear to auscultation  Pulmonary exam normal       Cardiovascular negative cardio ROS  Rhythm:regular Rate:Normal     Neuro/Psych  Headaches, negative psych ROS   GI/Hepatic negative GI ROS, Neg liver ROS,   Endo/Other  negative endocrine ROS  Renal/GU negative Renal ROS  negative genitourinary   Musculoskeletal   Abdominal Normal abdominal exam  (+)   Peds  Hematology negative hematology ROS (+)   Anesthesia Other Findings   Reproductive/Obstetrics (+) Pregnancy                           Anesthesia Physical Anesthesia Plan  ASA: III  Anesthesia Plan: Epidural   Post-op Pain Management:    Induction:   Airway Management Planned:   Additional Equipment:   Intra-op Plan:   Post-operative Plan:   Informed Consent: I have reviewed the patients History and Physical, chart, labs and discussed the procedure including the risks, benefits and alternatives for the proposed anesthesia with the patient or authorized representative who has indicated his/her understanding and acceptance.     Plan Discussed with: Anesthesiologist  Anesthesia Plan Comments:         Anesthesia Quick Evaluation

## 2012-01-26 NOTE — Progress Notes (Signed)
Whitney [name] is a 27 y.o. G1P0000 at [redacted]w[redacted]d by ultrasound admitted for active labor  Subjective: No chief complaint on file.   Objective: BP 124/67  Pulse 95  Temp 98.5 F (36.9 C) (Axillary)  Resp 20  Ht 6' (1.829 m)  Wt 155.584 kg (343 lb)  BMI 46.52 kg/m2  SpO2 100%  LMP 05/12/2011      FHT:  FHR: 150  bpm, variability: moderate,  accelerations:  Present,  decelerations:  Present noted after AROM. FHR decreased to 70"s x 2 1/2 mins UC:   irregular, every 2-5 minutes SVE:   6/100/-1. AROM clear fluid. IUPC, ISE placed Labs: Lab Results  Component Value Date   WBC 14.8* 01/26/2012   HGB 12.9 01/26/2012   HCT 38.6 01/26/2012   MCV 79.3 01/26/2012   PLT 199 01/26/2012   Nl PIH labs Assessment / Plan: Active labor GBS cx (+) on PCN Trichomoniasis completing IV Flagyl IUP @37  weeks P) pitocin augmentation prn.   Anticipated MOD:  Whitney [name] A 01/26/2012, 7:15 PM

## 2012-01-26 NOTE — H&P (Signed)
Whitney Whitney [name] is Whitney [name] 27 y.o. female presenting for admission due to early labor. Intact membrane. (+) GBS PNC complicated by trichomoniasis and abnl pap smear  Maternal Medical History:  Reason for admission: Reason for admission: contractions.  Contractions: Onset was 3-5 hours ago.   Frequency: irregular.   Perceived severity is strong.    Fetal activity: Perceived fetal activity is decreased.    Prenatal complications: Infection.     OB History    Grav Para Term Preterm Abortions TAB SAB Ect Mult Living   1              Past Medical History  Diagnosis Date  . No pertinent past medical history    Past Surgical History  Procedure Date  . No past surgeries    Family History: family history is negative for Anesthesia problems, and Hypotension, and Malignant hyperthermia, and Pseudochol deficiency, . Social History:  reports that she has never smoked. She has never used smokeless tobacco. She reports that she does not drink alcohol or use illicit drugs.   Prenatal Transfer Tool  Maternal Diabetes: No Genetic Screening: Normal Maternal Ultrasounds/Referrals: Normal Fetal Ultrasounds or other Referrals:  None Maternal Substance Abuse:  No Significant Maternal Medications:  None Significant Maternal Lab Results:  Lab values include: Group B Strep positive Other Comments:  trichomoniasis  pt unable to swallow treatment despite multiple attempts2) SS trait. FOB did not do testing  Review of Systems  All other systems reviewed and are negative.      Last menstrual period 05/12/2011. Maternal Exam:  Uterine Assessment: Contraction strength is moderate.  Abdomen: Patient reports no abdominal tenderness. Fetal presentation: vertex  Introitus: Normal vulva. Pelvis: adequate for delivery.   Cervix: Cervix evaluated by digital exam.     Physical Exam  Constitutional: She is oriented to person, place, and time. She appears well-developed and well-nourished.  Eyes: EOM are  normal.  Cardiovascular: Regular rhythm.   Respiratory: Breath sounds normal.  Neurological: She is alert and oriented to person, place, and time.  Skin: Skin is warm and dry.  Psychiatric: She has Whitney [name] normal mood and affect.    Prenatal labs: ABO, Rh:  AB positive Antibody:  neg Rubella:  Immune RPR:   NR HBsAg:  neg HIV:   neg GBS:   positive  Assessment/Plan: Early  labor GBS cx (+) IUP @ 37 weeks Trichomoniasis P)  Admit. IV PCN. IV Flagyl. Routine labs. Ambulate. If no cervical change to confirm true labor , advised will be sent home  Whitney Whitney [name] 01/26/2012, 11:49 AM

## 2012-01-26 NOTE — Anesthesia Procedure Notes (Signed)
Epidural Patient location during procedure: OB Start time: 01/26/2012 5:24 PM  Staffing Anesthesiologist: Tenika Keeran A. Performed by: anesthesiologist   Preanesthetic Checklist Completed: patient identified, site marked, surgical consent, pre-op evaluation, timeout performed, IV checked, risks and benefits discussed and monitors and equipment checked  Epidural Patient position: sitting Prep: site prepped and draped and DuraPrep Patient monitoring: continuous pulse ox and blood pressure Approach: midline Injection technique: LOR air  Needle:  Needle type: Tuohy  Needle gauge: 17 G Needle length: 9 cm and 9 Needle insertion depth: 8 cm Catheter type: closed end flexible Catheter size: 19 Gauge Catheter at skin depth: 14 cm Test dose: negative and Other  Assessment Events: blood not aspirated, injection not painful, no injection resistance, negative IV test and no paresthesia  Additional Notes Patient identified. Risks and benefits discussed including failed block, incomplete  Pain control, post dural puncture headache, nerve damage, paralysis, blood pressure Changes, nausea, vomiting, reactions to medications-both toxic and allergic and post Partum back pain. All questions were answered. Patient expressed understanding and wished to proceed. Sterile technique was used throughout procedure. Epidural site was Dressed with sterile barrier dressing. No paresthesias, signs of intravascular injection Or signs of intrathecal spread were encountered.  Patient was more comfortable after the epidural was dosed. Please see RN's note for documentation of vital signs and FHR which are stable.

## 2012-01-27 ENCOUNTER — Encounter (HOSPITAL_COMMUNITY): Payer: Self-pay | Admitting: *Deleted

## 2012-01-27 DIAGNOSIS — B951 Streptococcus, group B, as the cause of diseases classified elsewhere: Secondary | ICD-10-CM | POA: Diagnosis present

## 2012-01-27 DIAGNOSIS — A599 Trichomoniasis, unspecified: Secondary | ICD-10-CM | POA: Diagnosis present

## 2012-01-27 DIAGNOSIS — IMO0001 Reserved for inherently not codable concepts without codable children: Secondary | ICD-10-CM

## 2012-01-27 MED ORDER — BENZOCAINE-MENTHOL 20-0.5 % EX AERO
1.0000 "application " | INHALATION_SPRAY | CUTANEOUS | Status: DC | PRN
  Administered 2012-01-27: 1 via TOPICAL
  Filled 2012-01-27: qty 56

## 2012-01-27 MED ORDER — SENNOSIDES-DOCUSATE SODIUM 8.6-50 MG PO TABS: 2.0000 | ORAL_TABLET | Freq: Every day | ORAL | Status: DC

## 2012-01-27 MED ORDER — ONDANSETRON HCL 4 MG/2ML IJ SOLN
4.0000 mg | INTRAMUSCULAR | Status: DC | PRN
  Administered 2012-01-27: 4 mg via INTRAVENOUS
  Filled 2012-01-27: qty 2

## 2012-01-27 MED ORDER — DIPHENHYDRAMINE HCL 25 MG PO CAPS: 25.0000 mg | ORAL_CAPSULE | Freq: Four times a day (QID) | ORAL | Status: DC | PRN

## 2012-01-27 MED ORDER — OXYCODONE-ACETAMINOPHEN 5-325 MG/5ML PO SOLN
5.0000 mL | ORAL | Status: DC | PRN
  Administered 2012-01-27: 5 mL via ORAL
  Filled 2012-01-27: qty 5

## 2012-01-27 MED ORDER — ONDANSETRON HCL 4 MG PO TABS: 4.0000 mg | ORAL_TABLET | ORAL | Status: DC | PRN

## 2012-01-27 MED ORDER — DIBUCAINE 1 % RE OINT: 1.0000 "application " | TOPICAL_OINTMENT | RECTAL | Status: DC | PRN

## 2012-01-27 MED ORDER — TETANUS-DIPHTH-ACELL PERTUSSIS 5-2.5-18.5 LF-MCG/0.5 IM SUSP
0.5000 mL | Freq: Once | INTRAMUSCULAR | Status: AC
  Administered 2012-01-27: 0.5 mL via INTRAMUSCULAR
  Filled 2012-01-27 (×3): qty 0.5

## 2012-01-27 MED ORDER — PRENATAL MULTIVITAMIN CH
1.0000 | ORAL_TABLET | Freq: Every day | ORAL | Status: DC
  Administered 2012-01-28: 1 via ORAL
  Filled 2012-01-27 (×2): qty 1

## 2012-01-27 MED ORDER — SIMETHICONE 80 MG PO CHEW: 80.0000 mg | CHEWABLE_TABLET | ORAL | Status: DC | PRN

## 2012-01-27 MED ORDER — LANOLIN HYDROUS EX OINT: TOPICAL_OINTMENT | CUTANEOUS | Status: DC | PRN

## 2012-01-27 MED ORDER — ZOLPIDEM TARTRATE 5 MG PO TABS: 5.0000 mg | ORAL_TABLET | Freq: Every evening | ORAL | Status: DC | PRN

## 2012-01-27 MED ORDER — IBUPROFEN 100 MG/5ML PO SUSP
800.0000 mg | Freq: Three times a day (TID) | ORAL | Status: DC | PRN
  Administered 2012-01-27 – 2012-01-29 (×6): 800 mg via ORAL
  Filled 2012-01-27 (×7): qty 40

## 2012-01-27 MED ORDER — WITCH HAZEL-GLYCERIN EX PADS: 1.0000 "application " | MEDICATED_PAD | CUTANEOUS | Status: DC | PRN

## 2012-01-27 NOTE — Anesthesia Postprocedure Evaluation (Signed)
Anesthesia Post Note  Patient: Whitney [name]  Procedure(s) Performed: * No procedures listed *  Anesthesia type: Epidural  Patient location: Mother/Baby  Post pain: Pain level controlled  Post assessment: Post-op Vital signs reviewed  Last Vitals:  Filed Vitals:   01/27/12 0723  BP: 123/79  Pulse: 105  Temp: 37.1 C  Resp: 20    Post vital signs: Reviewed  Level of consciousness:alert  Complications: No apparent anesthesia complications

## 2012-01-27 NOTE — Progress Notes (Signed)
S: comfortable Epidural  O:  VE 6-7/100/-1 Clear fluid( amniotomy dione Tracing; baseline 135-140 Ctx q 2-3 mins. Had decel x 2 1/2 mins w/ good variability and return to baseline  IMP:1) active phase 2) Trichomoniasis treated with Flagyl IV #( GBS cx (+) on IV PCN  P) Pitocin. Cont IV PCN Close fetal monitoring

## 2012-01-27 NOTE — Progress Notes (Signed)
PPD 0 SVD - interval visit  S:  Reports feeling well - just tired             Tolerating po/ No nausea or vomiting             Bleeding is moderate             Pain controlled with motrin and percocet so far             Up ad lib / ambulatory / voided QS upon arrival to postpartum  Newborn breast-feeding  / Circumcision planned   O:               VS: BP 123/79  Pulse 105  Temp 98.8 F (37.1 C) (Oral)  Resp 20  Ht 6' (1.829 m)  Wt 155.584 kg (343 lb)  BMI 46.52 kg/m2  SpO2 100%  LMP 05/12/2011  Breastfeeding? Unknown              Physical Exam:             Alert and oriented X3  Abdomen: soft, non-tender, non-distended              Fundus: firm, non-tender, U+1  Perineum: moderate edema - ice pack in place  Lochia: moderate  Extremities: 1+ dependent edema, no calf pain or tenderness    A: PPD # 0   Doing well - stable status  P:  Routine post partum orders  Consider early D/C tomorrow             Offer TdaP  Marlinda Mike CNM, MSN 01/27/2012, 10:09 AM

## 2012-01-27 NOTE — Progress Notes (Signed)
S; no rectal pressure  O; Epidural Pitocin VE fully dilated/100/+1 station Tracing: reassuring. Had another episode of decel but again returned to baseline 140 Ctx q 2-3 mins  IMP: Complete GBS cx (+) IUP @ 37 + weeks  P) labor vtx down. Cont fetal monitoring. Continue pitocin

## 2012-01-28 ENCOUNTER — Encounter (HOSPITAL_COMMUNITY): Payer: Self-pay | Admitting: *Deleted

## 2012-01-28 LAB — CBC
HCT: 31.1 % — ABNORMAL LOW (ref 36.0–46.0)
Hemoglobin: 10.4 g/dL — ABNORMAL LOW (ref 12.0–15.0)
MCH: 26.9 pg (ref 26.0–34.0)
MCHC: 33.4 g/dL (ref 30.0–36.0)
MCV: 80.6 fL (ref 78.0–100.0)
Platelets: 180 10*3/uL (ref 150–400)
RBC: 3.86 MIL/uL — ABNORMAL LOW (ref 3.87–5.11)
RDW: 14.3 % (ref 11.5–15.5)
WBC: 11.9 10*3/uL — ABNORMAL HIGH (ref 4.0–10.5)

## 2012-01-28 NOTE — Progress Notes (Signed)
PPD 1 SVD  S:  Reports feeling well - tired             Tolerating po/ No nausea or vomiting             Bleeding is light             Pain controlled with motrin and percocet             Up ad lib / ambulatory  Newborn breast feeding  / Circumcision planned   O:               VS: BP 124/82  Pulse 91  Temp 98.4 F (36.9 C) (Oral)  Resp 18  Ht 6' (1.829 m)  Wt 155.584 kg (343 lb)  BMI 46.52 kg/m2  SpO2 100%  LMP 05/12/2011  Breastfeeding? Unknown  LABS:  Basename 01/26/12 1230  WBC 14.8*  HGB 12.9  PLT 199        PPD 1  01/27/2012: wbc 11.9 /  hgb 10.4 /   hct 31.1 /   plt  180                        Physical Exam:             Alert and oriented X3  Lungs: Clear and unlabored  Heart: regular rate and rhythm / no mumurs  Abdomen: soft, non-tender, non-distended              Fundus: firm, non-tender, Ueven  Perineum: mild edema  Lochia: light  Extremities: trace edema, no calf pain or tenderness   A: PPD # 1   Doing well - stable status  P:  Routine post partum orders             Anticipate discharge tomorrow   Marlinda Mike CNM, MSN 01/28/2012, 5:55 AM

## 2012-01-29 MED ORDER — OXYCODONE-ACETAMINOPHEN 5-325 MG/5ML PO SOLN: 5.0000 mL | ORAL | Status: DC | PRN

## 2012-01-29 MED ORDER — IBUPROFEN 100 MG/5ML PO SUSP: 800.0000 mg | Freq: Three times a day (TID) | ORAL | Status: DC | PRN

## 2012-01-29 NOTE — Progress Notes (Signed)
Patient ID: Whitney [name], female   DOB: [birth year], 27 y.o.   MRN: [id number] PPD # 2  Subjective: Pt reports feeling well and eager for d/c home/ Pain controlled with ibuprofen and occ percocet Tolerating po/ Voiding without problems/ No n/v Bleeding is light/ Newborn info:  Information for the patient's newborn:  Marlayna, Bannister [[id number]]  female  Feeding: breast    Objective:  VS: Blood pressure 142/77, pulse 80, temperature 98.8 F (37.1 C), temperature source Oral, resp. rate 18.    Basename 01/28/12 0530 01/26/12 1230  WBC 11.9* 14.8*  HGB 10.4* 12.9  HCT 31.1* 38.6  PLT 180 199    Blood type: --/--/AB POS, AB POS (01/03 1230) Rubella: Immune (07/15 0000)    Physical Exam:  General: A & O x 3  alert, cooperative and no distress CV: Regular rate and rhythm Resp: clear Abdomen: soft, nontender, normal bowel sounds Uterine Fundus: firm, below umbilicus, nontender Perineum: not inspected; pt dressed Lochia: minimal Ext: edema +1 and Homans sign is negative, no sign of DVT    A/P: PPD # 2/ G1P1001/ S/P:spontaneous vaginal delivery  Doing well and stable for discharge home RX: Ibuprofen 800 mg po Q 8 hrs prn pain liquid soln Refill x 1 Percocet 5/325 5ml liquid soln po Q 6 hrs prn pain 60ml No refill WOB/GYN booklet given Routine pp visit in 6wks   Demetrius Revel, MSN, [facility] 01/29/2012, 9:53 AM

## 2012-01-29 NOTE — Discharge Summary (Signed)
Obstetric Discharge Summary Reason for Admission: Onset early labor @ 37 wks.  GBS+.  Pos trichomonias, complicating pregnancy Prenatal Procedures: NST and ultrasound Intrapartum Procedures: spontaneous vaginal delivery Postpartum Procedures: none Complications-Operative and Postpartum: trichomonas Hemoglobin  Date Value Range Status  01/28/2012 10.4* 12.0 - 15.0 g/dL Final     REPEATED TO VERIFY     DELTA CHECK NOTED  11/07/2011 12.3   Final     HCT  Date Value Range Status  01/28/2012 31.1* 36.0 - 46.0 % Final  11/07/2011 36   Final    Physical Exam:  General: alert, cooperative and no distress Lochia: appropriate Uterine Fundus: firm Incision: n./a DVT Evaluation: No evidence of DVT seen on physical exam.  Discharge Diagnoses: Term Pregnancy-delivered and trichomonas treated  Discharge Information: Date: 01/29/2012 Activity: pelvic rest Diet: routine Medications: Ibuprofen, percocet and PNV Condition: stable Instructions: refer to practice specific booklet Discharge to: home   Newborn Data: Live born female 01/27/12 Birth Weight: 7 lb 4.8 oz (3311 g) APGAR: 9, 9  Home with mother.  Patrizia Paule K 01/29/2012, 9:54 AM

## 2012-03-11 ENCOUNTER — Inpatient Hospital Stay (HOSPITAL_COMMUNITY)
Admission: EM | Admit: 2012-03-11 | Discharge: 2012-03-13 | DRG: 493 | Disposition: A | Discharge status: Home / Self Care | Payer: BC Managed Care – PPO | Attending: Internal Medicine | Admitting: Internal Medicine

## 2012-03-11 ENCOUNTER — Encounter (HOSPITAL_COMMUNITY): Payer: Self-pay

## 2012-03-11 ENCOUNTER — Inpatient Hospital Stay (HOSPITAL_COMMUNITY): Payer: BC Managed Care – PPO

## 2012-03-11 DIAGNOSIS — R109 Unspecified abdominal pain: Secondary | ICD-10-CM

## 2012-03-11 DIAGNOSIS — R112 Nausea with vomiting, unspecified: Secondary | ICD-10-CM | POA: Diagnosis present

## 2012-03-11 DIAGNOSIS — K851 Biliary acute pancreatitis without necrosis or infection: Secondary | ICD-10-CM | POA: Diagnosis present

## 2012-03-11 DIAGNOSIS — O98819 Other maternal infectious and parasitic diseases complicating pregnancy, unspecified trimester: Secondary | ICD-10-CM

## 2012-03-11 DIAGNOSIS — B951 Streptococcus, group B, as the cause of diseases classified elsewhere: Secondary | ICD-10-CM

## 2012-03-11 DIAGNOSIS — K8010 Calculus of gallbladder with chronic cholecystitis without obstruction: Secondary | ICD-10-CM

## 2012-03-11 DIAGNOSIS — K859 Acute pancreatitis without necrosis or infection, unspecified: Secondary | ICD-10-CM

## 2012-03-11 DIAGNOSIS — R17 Unspecified jaundice: Secondary | ICD-10-CM | POA: Diagnosis present

## 2012-03-11 DIAGNOSIS — A599 Trichomoniasis, unspecified: Secondary | ICD-10-CM

## 2012-03-11 DIAGNOSIS — K824 Cholesterolosis of gallbladder: Secondary | ICD-10-CM

## 2012-03-11 DIAGNOSIS — Z6841 Body Mass Index (BMI) 40.0 and over, adult: Secondary | ICD-10-CM

## 2012-03-11 DIAGNOSIS — R52 Pain, unspecified: Secondary | ICD-10-CM

## 2012-03-11 HISTORY — DX: Calculus of gallbladder without cholecystitis without obstruction: K80.20

## 2012-03-11 LAB — POCT PREGNANCY, URINE: Preg Test, Ur: NEGATIVE

## 2012-03-11 LAB — COMPREHENSIVE METABOLIC PANEL
ALT: 583 U/L — ABNORMAL HIGH (ref 0–35)
AST: 731 U/L — ABNORMAL HIGH (ref 0–37)
Albumin: 3.9 g/dL (ref 3.5–5.2)
Alkaline Phosphatase: 212 U/L — ABNORMAL HIGH (ref 39–117)
BUN: 8 mg/dL (ref 6–23)
CO2: 26 mEq/L (ref 19–32)
Calcium: 9.7 mg/dL (ref 8.4–10.5)
Chloride: 105 mEq/L (ref 96–112)
Creatinine, Ser: 0.78 mg/dL (ref 0.50–1.10)
Creatinine, Ser: 0.9 mg/dL (ref 0.50–1.10)
GFR calc Af Amer: >90 mL/min (ref >90)
GFR calc non Af Amer: 87 mL/min — ABNORMAL LOW (ref >90)
Glucose, Bld: 112 mg/dL — ABNORMAL HIGH (ref 70–99)
Potassium: 3.8 mEq/L (ref 3.5–5.1)
Sodium: 142 mEq/L (ref 135–145)
Total Bilirubin: 2.7 mg/dL — ABNORMAL HIGH (ref 0.3–1.2)
Total Protein: 7.9 g/dL (ref 6.0–8.3)
Total Protein: 6.7 g/dL (ref 6.0–8.3)

## 2012-03-11 LAB — URINALYSIS, MICROSCOPIC ONLY
Glucose, UA: NEGATIVE mg/dL
Protein, ur: NEGATIVE mg/dL
pH: 6 (ref 5.0–8.0)

## 2012-03-11 LAB — CBC WITH DIFFERENTIAL/PLATELET
Basophils Absolute: 0 10*3/uL (ref 0.0–0.1)
Basophils Relative: 1 % (ref 0–1)
Eosinophils Absolute: 0.1 10*3/uL (ref 0.0–0.7)
HCT: 39.3 % (ref 36.0–46.0)
Hemoglobin: 13.3 g/dL (ref 12.0–15.0)
Lymphocytes Relative: 29 % (ref 12–46)
Lymphs Abs: 1.6 10*3/uL (ref 0.7–4.0)
MCHC: 35.1 g/dL (ref 30.0–36.0)
Monocytes Absolute: 0.5 10*3/uL (ref 0.1–1.0)
Monocytes Relative: 8 % (ref 3–12)
Neutro Abs: 5.7 10*3/uL (ref 1.7–7.7)
Neutro Abs: 3.5 10*3/uL (ref 1.7–7.7)
Neutrophils Relative %: 70 % (ref 43–77)
Neutrophils Relative %: 61 % (ref 43–77)
RBC: 5.09 MIL/uL (ref 3.87–5.11)
RDW: 13.4 % (ref 11.5–15.5)
WBC: 5.6 10*3/uL (ref 4.0–10.5)

## 2012-03-11 LAB — LIPASE, BLOOD: Lipase: 1151 U/L — ABNORMAL HIGH (ref 11–59)

## 2012-03-11 MED ORDER — KETOROLAC TROMETHAMINE 30 MG/ML IJ SOLN
30.0000 mg | Freq: Once | INTRAMUSCULAR | Status: AC
  Administered 2012-03-11: 30 mg via INTRAVENOUS
  Filled 2012-03-11: qty 1

## 2012-03-11 MED ORDER — SODIUM CHLORIDE 0.9 % IV BOLUS (SEPSIS)
1000.0000 mL | Freq: Once | INTRAVENOUS | Status: AC
  Administered 2012-03-11: 1000 mL via INTRAVENOUS

## 2012-03-11 MED ORDER — ONDANSETRON HCL 4 MG/2ML IJ SOLN
4.0000 mg | Freq: Once | INTRAMUSCULAR | Status: AC
  Administered 2012-03-11: 4 mg via INTRAVENOUS
  Filled 2012-03-11: qty 2

## 2012-03-11 MED ORDER — HYDROMORPHONE HCL PF 1 MG/ML IJ SOLN
1.0000 mg | INTRAMUSCULAR | Status: DC | PRN
  Administered 2012-03-11 (×3): 1 mg via INTRAVENOUS
  Filled 2012-03-11 (×3): qty 1

## 2012-03-11 MED ORDER — GADOBENATE DIMEGLUMINE 529 MG/ML IV SOLN
20.0000 mL | Freq: Once | INTRAVENOUS | Status: AC | PRN
  Administered 2012-03-11: 20 mL via INTRAVENOUS

## 2012-03-11 MED ORDER — ONDANSETRON HCL 4 MG/2ML IJ SOLN
4.0000 mg | Freq: Four times a day (QID) | INTRAMUSCULAR | Status: DC | PRN
  Administered 2012-03-11: 4 mg via INTRAVENOUS
  Filled 2012-03-11: qty 2

## 2012-03-11 MED ORDER — SODIUM CHLORIDE 0.9 % IV SOLN: INTRAVENOUS | Status: DC

## 2012-03-11 MED ORDER — SODIUM CHLORIDE 0.9 % IV SOLN
INTRAVENOUS | Status: AC
  Administered 2012-03-11: 13:00:00 via INTRAVENOUS

## 2012-03-11 MED ORDER — ONDANSETRON HCL 4 MG PO TABS: 4.0000 mg | ORAL_TABLET | Freq: Four times a day (QID) | ORAL | Status: DC | PRN

## 2012-03-11 MED ORDER — ENOXAPARIN SODIUM 60 MG/0.6ML ~~LOC~~ SOLN
60.0000 mg | SUBCUTANEOUS | Status: DC
  Administered 2012-03-11 – 2012-03-12 (×2): 60 mg via SUBCUTANEOUS
  Filled 2012-03-11 (×3): qty 0.6

## 2012-03-11 NOTE — Consult Note (Signed)
[facility] Gastroenterology Consultation Note  Referring Provider:  Dr. Tarry Kos ([facility])  Reason for Consultation:  Gallstone pancreatitis  HPI: Whitney [name] is a 27 y.o. female admitted for acute onset epigastric pain with nausea and vomiting.  No fevers, blood in stool, change in bowel habits.  Is 6 weeks post-partum, and has had multiple similar (albeit most were less severe) episodes since October 2013.  Labs showed elevated LFTs and hyperlipasemia.  Had ultrasound in October 2013 showing gallstones and non-dilated CBD.  At present, her pain has essentially resolved.   Past Medical History  Diagnosis Date  . No pertinent past medical history   . Headache   . Gallstones     Past Surgical History  Procedure Laterality Date  . No past surgeries      Prior to Admission medications   Medication Sig Start Date End Date Taking? Authorizing Provider  Prenatal Vit-Fe Fumarate-FA (PRENATAL MULTIVITAMIN) TABS Take 1 tablet by mouth daily.   Yes Historical Provider, MD    Current Facility-Administered Medications  Medication Dose Route Frequency Provider Last Rate Last Dose  . 0.9 %  sodium chloride infusion   Intravenous Continuous Tarry Kos, MD 125 mL/hr at 03/11/12 0518    . enoxaparin (LOVENOX) injection 60 mg  60 mg Subcutaneous Q24H Tarry Kos, MD      . HYDROmorphone (DILAUDID) injection 1 mg  1 mg Intravenous Q2H PRN Tarry Kos, MD      . ondansetron [facility]) tablet 4 mg  4 mg Oral Q6H PRN Tarry Kos, MD       Or  . ondansetron [facility]) injection 4 mg  4 mg Intravenous Q6H PRN Tarry Kos, MD        Allergies as of 03/11/2012  . (No Known Allergies)    Family History  Problem Relation Age of Onset  . Anesthesia problems Neg Hx   . Hypotension Neg Hx   . Malignant hyperthermia Neg Hx   . Pseudochol deficiency Neg Hx   . Diabetes Maternal Grandmother     History   Social History  . Marital Status: Single    Spouse Name: N/A    Number of  Children: N/A  . Years of Education: N/A   Occupational History  . Not on file.   Social History Main Topics  . Smoking status: Former Smoker    Types: Cigarettes  . Smokeless tobacco: Never Used  . Alcohol Use: No  . Drug Use: No  . Sexually Active: Yes    Birth Control/ Protection: None   Other Topics Concern  . Not on file   Social History Narrative  . No narrative on file    Review of Systems: Positive =  headaches Gen: Denies any fever, chills, rigors, night sweats, anorexia, fatigue, weakness, malaise, involuntary weight loss, and sleep disorder CV: Denies chest pain, angina, palpitations, syncope, orthopnea, PND, peripheral edema, and claudication. Resp: Denies dyspnea, cough, sputum, wheezing, coughing up blood. GI: Described in detail in HPI.    GU : Denies urinary burning, blood in urine, urinary frequency, urinary hesitancy, nocturnal urination, and urinary incontinence. MS: Denies joint pain or swelling.  Denies muscle weakness, cramps, atrophy.  Derm: Denies rash, itching, oral ulcerations, hives, unhealing ulcers.  Psych: Denies depression, anxiety, memory loss, suicidal ideation, hallucinations,  and confusion. Heme: Denies bruising, bleeding, and enlarged lymph nodes. Neuro:  Denies any headaches, dizziness, paresthesias. Endo:  Denies any problems with DM, thyroid, adrenal function.  Physical Exam: Vital signs in last  24 hours: Temp:  [97.9 F (36.6 C)-98 F (36.7 C)] 97.9 F (36.6 C) (02/17 0452) Pulse Rate:  [90-91] 91 (02/17 0452) Resp:  [18] 18 (02/17 0452) BP: (134-149)/(77-87) 134/77 mmHg (02/17 0452) SpO2:  [97 %-98 %] 97 % (02/17 0452) Weight:  [155.5 kg (342 lb 13 oz)] 155.5 kg (342 lb 13 oz) (02/17 0452) Last BM Date: 03/10/12 General:   Alert, overweight,  Well-developed, well-nourished, pleasant and cooperative in NAD Head:  Normocephalic and atraumatic. Eyes:  Sclera clear, trace scleral icterus bilaterally;   Conjunctiva pink. Ears:   Normal auditory acuity. Nose:  No deformity, discharge,  or lesions. Mouth:  No deformity or lesions.  Oropharynx pink; mucous membranes a bit dry. Neck:  Thick but supple; no masses or thyromegaly. Lungs:  Clear throughout to auscultation.   No wheezes, crackles, or rhonchi. No acute distress. Heart:  Regular rate and rhythm; no murmurs, clicks, rubs,  or gallops. Abdomen:  Soft, hypoactive bowel sounds; very mild tenderness to deep palpation of epigastrium. No masses, hepatosplenomegaly or hernias noted. Normal bowel sounds, without guarding, and without rebound.     Msk:  Symmetrical without gross deformities. Normal posture. Pulses:  Normal pulses noted. Extremities:  Without clubbing or edema. Neurologic:  Alert and  oriented x4;  grossly normal neurologically. Skin:  Intact without significant lesions or rashes. Psych:  Alert and cooperative. Normal mood and affect.   Lab Results:  Recent Labs  03/11/12 0056  WBC 8.1  HGB 14.7  HCT 41.9  PLT 288   BMET  Recent Labs  03/11/12 0056  NA 141  K 3.8  CL 105  CO2 25  GLUCOSE 123*  BUN 9  CREATININE 0.78  CALCIUM 10.4   LFT  Recent Labs  03/11/12 0056  PROT 7.9  ALBUMIN 3.9  AST 731*  ALT 573*  ALKPHOS 212*  BILITOT 2.7*   PT/INR No results found for this basename: LABPROT, INR,  in the last 72 hours  Studies/Results: No results found.  Impression:  1.  Gallstone pancreatitis.  Symptoms quiescent at present.   2.  Elevated LFTs. 3.  Nausea and vomiting.  Plan:  1.  Recheck LFTs and lipase. 2.  MRCP. 3.  Surgical consult; if MRCP is unrevealing and LFTs downtrend, would consider cholecystectomy with intraoperative cholangiogram as inpatient; if MRCP is positive, would do ERCP; if MRCP is negative but LFTs don't downtrend, would consider EUS with same-day ERCP if choledocholithiasis is seen. 4.  Will follow.   LOS: 0 days   [name] M  03/11/2012, 10:10 AM

## 2012-03-11 NOTE — Progress Notes (Signed)
[facility] PROGRESS NOTE  Whitney [name] ZOX:[id number] DOB: October 08, 1985 DOA: 03/11/2012 PCP: Default, Provider, MD  Assessment/Plan: 1. Acute pancreatitis - likely secondary to gallstones. Gastroenterology has been consulted, we appreciate their input. MRCP pending, will decide further management pending the results. We'll continue IV fluids for now.  Code Status: Full Family Communication: none  Disposition Plan: pending MRCP  Consultants:  Gastroenterology  Procedures:  none  Antibiotics:  None   HPI/Subjective: - Feels well this morning, denies any nausea or abdominal pain.  Objective: Filed Vitals:   03/11/12 0038 03/11/12 0415 03/11/12 0452  BP: 149/87 140/82 134/77  Pulse: 91 90 91  Temp: 98 F (36.7 C)  97.9 F (36.6 C)  TempSrc: Oral  Oral  Resp: 18 18 18   Height:   5\' 11"  (1.803 m)  Weight:   155.5 kg (342 lb 13 oz)  SpO2: 97% 98% 97%   No intake or output data in the 24 hours ending 03/11/12 1234 Filed Weights   03/11/12 0452  Weight: 155.5 kg (342 lb 13 oz)    Exam:   General:  She is in no acute distress  Cardiovascular: Regular rate and rhythm, without murmurs rubs or gallops  Respiratory: Clear to auscultation bilaterally  Abdomen: Soft nontender to palpation  Data Reviewed: Basic Metabolic Panel:  Recent Labs Lab 03/11/12 0056 03/11/12 1047  NA 141 142  K 3.8 3.5  CL 105 108  CO2 25 26  GLUCOSE 123* 112*  BUN 9 8  CREATININE 0.78 0.90  CALCIUM 10.4 9.7   Liver Function Tests:  Recent Labs Lab 03/11/12 0056 03/11/12 1047  AST 731* 551*  ALT 573* 583*  ALKPHOS 212* 213*  BILITOT 2.7* 3.2*  PROT 7.9 6.7  ALBUMIN 3.9 3.3*    Recent Labs Lab 03/11/12 0056 03/11/12 1047  LIPASE 1151* >3000*   CBC:  Recent Labs Lab 03/11/12 0056 03/11/12 1047  WBC 8.1 5.6  NEUTROABS 5.7 3.5  HGB 14.7 13.3  HCT 41.9 39.3  MCV 76.2* 77.2*  PLT 288 248   Studies: No results found.  Scheduled Meds: . enoxaparin  (LOVENOX) injection  60 mg Subcutaneous Q24H   Continuous Infusions: . sodium chloride 125 mL/hr at 03/11/12 0518    Principal Problem:   Gallstone pancreatitis Active Problems:   Abdominal pain, acute   Nausea & vomiting   Postpartum state  [name]  [facility] Pager [phone]. If 7 PM - 7 AM, please contact night-coverage at [email or web address], password [facility] 03/11/2012, 12:34 PM  LOS: 0 days

## 2012-03-11 NOTE — Consult Note (Signed)
I have seen and examined the patient and agree with the assessment and plans. Possible Lap chole tomorrow if MRCP neg  Mathew Postiglione A. Magnus Ivan  MD, FACS

## 2012-03-11 NOTE — Consult Note (Signed)
Whitney [name] [birth year]  [id number].   Requesting MD: Dr. Willis Modena Chief Complaint/Reason for Consult: gallstone pancreatitis HPI: this is a very pleasant 27yo black female who is 6 weeks postpartum who developed epigastric abdominal pain yesterday at home.  She then had nausea and vomiting with this.  She knew it was likely her gallbladder as she has had prior attacks throughout her pregnancy and had an ultrasound that confirmed gallstones.  This episode was more severe than any other episode.  She came to the [facility] for further evaluation.  She was found to have elevated LFTs and a lipase.  She was admitted for gallstone pancreatitis.  We have been asked to see her for cholecystectomy.  Review of Systems: Please see HPI, otherwise her pain has resolved.  All other systems are negative.  Family History  Problem Relation Age of Onset  . Anesthesia problems Neg Hx   . Hypotension Neg Hx   . Malignant hyperthermia Neg Hx   . Pseudochol deficiency Neg Hx   . Diabetes Maternal Grandmother     Past Medical History  Diagnosis Date  . No pertinent past medical history   . Headache   . Gallstones     Past Surgical History  Procedure Laterality Date  . No past surgeries      Social History:  reports that she has quit smoking. Her smoking use included Cigarettes. She smoked 0.00 packs per day. She has never used smokeless tobacco. She reports that she does not drink alcohol or use illicit drugs.  Allergies: No Known Allergies  Medications Prior to Admission  Medication Sig Dispense Refill  . Prenatal Vit-Fe Fumarate-FA (PRENATAL MULTIVITAMIN) TABS Take 1 tablet by mouth daily.        Blood pressure 134/77, pulse 91, temperature 97.9 F (36.6 C), temperature source Oral, resp. rate 18, height 5\' 11"  (1.803 m), weight 342 lb 13 oz (155.5 kg), last menstrual period 03/04/2012, SpO2 97.00%, not currently breastfeeding. Physical Exam: General: pleasant, obese black female who is laying  in bed in NAD HEENT: head is normocephalic, atraumatic.  Sclera are noninjected.  PERRL.  Ears and nose without any masses or lesions.  Mouth is pink and moist Heart: regular, rate, and rhythm.  Normal s1,s2. No obvious murmurs, gallops, or rubs noted.  Palpable radial and pedal pulses bilaterally Lungs: CTAB, no wheezes, rhonchi, or rales noted.  Respiratory effort nonlabored Abd: soft, minimal upper abdominal tenderness, ND, +BS, no masses, hernias, or organomegaly MS: all 4 extremities are symmetrical with no cyanosis, clubbing, or edema. Skin: warm and dry with no masses, lesions, or rashes Psych: A&Ox3 with an appropriate affect.    Results for orders placed during the hospital encounter of 03/11/12 (from the past 48 hour(s))  LIPASE, BLOOD     Status: Abnormal   Collection Time    03/11/12 12:56 AM      Result Value Range   Lipase 1151 (*) 11 - 59 U/L  CBC WITH DIFFERENTIAL     Status: Abnormal   Collection Time    03/11/12 12:56 AM      Result Value Range   WBC 8.1  4.0 - 10.5 K/uL   RBC 5.50 (*) 3.87 - 5.11 MIL/uL   Hemoglobin 14.7  12.0 - 15.0 g/dL   HCT 40.9  81.1 - 91.4 %   MCV 76.2 (*) 78.0 - 100.0 fL   MCH 26.7  26.0 - 34.0 pg   MCHC 35.1  30.0 - 36.0 g/dL  RDW 13.4  11.5 - 15.5 %   Platelets 288  150 - 400 K/uL   Neutrophils Relative 70  43 - 77 %   Neutro Abs 5.7  1.7 - 7.7 K/uL   Lymphocytes Relative 20  12 - 46 %   Lymphs Abs 1.6  0.7 - 4.0 K/uL   Monocytes Relative 9  3 - 12 %   Monocytes Absolute 0.8  0.1 - 1.0 K/uL   Eosinophils Relative 1  0 - 5 %   Eosinophils Absolute 0.1  0.0 - 0.7 K/uL   Basophils Relative 1  0 - 1 %   Basophils Absolute 0.0  0.0 - 0.1 K/uL  COMPREHENSIVE METABOLIC PANEL     Status: Abnormal   Collection Time    03/11/12 12:56 AM      Result Value Range   Sodium 141  135 - 145 mEq/L   Potassium 3.8  3.5 - 5.1 mEq/L   Chloride 105  96 - 112 mEq/L   CO2 25  19 - 32 mEq/L   Glucose, Bld 123 (*) 70 - 99 mg/dL   BUN 9  6 - 23  mg/dL   Creatinine, Ser 1.61  0.50 - 1.10 mg/dL   Calcium 09.6  8.4 - 04.5 mg/dL   Total Protein 7.9  6.0 - 8.3 g/dL   Albumin 3.9  3.5 - 5.2 g/dL   AST 409 (*) 0 - 37 U/L   ALT 573 (*) 0 - 35 U/L   Alkaline Phosphatase 212 (*) 39 - 117 U/L   Total Bilirubin 2.7 (*) 0.3 - 1.2 mg/dL   GFR calc non Af Amer >90  >90 mL/min   GFR calc Af Amer >90  >90 mL/min   Comment:            The eGFR has been calculated     using the CKD EPI equation.     This calculation has not been     validated in all clinical     situations.     eGFR's persistently     <90 mL/min signify     possible Chronic Kidney Disease.  URINALYSIS, MICROSCOPIC ONLY     Status: Abnormal   Collection Time    03/11/12  1:26 AM      Result Value Range   Color, Urine AMBER (*) YELLOW   Comment: BIOCHEMICALS MAY BE AFFECTED BY COLOR   APPearance CLOUDY (*) CLEAR   Specific Gravity, Urine 1.015  1.005 - 1.030   pH 6.0  5.0 - 8.0   Glucose, UA NEGATIVE  NEGATIVE mg/dL   Hgb urine dipstick SMALL (*) NEGATIVE   Bilirubin Urine MODERATE (*) NEGATIVE   Ketones, ur NEGATIVE  NEGATIVE mg/dL   Protein, ur NEGATIVE  NEGATIVE mg/dL   Urobilinogen, UA 1.0  0.0 - 1.0 mg/dL   Nitrite NEGATIVE  NEGATIVE   Leukocytes, UA MODERATE (*) NEGATIVE   WBC, UA 21-50  <3 WBC/hpf   RBC / HPF 0-2  <3 RBC/hpf   Bacteria, UA FEW (*) RARE   Squamous Epithelial / LPF FEW (*) RARE  POCT PREGNANCY, URINE     Status: None   Collection Time    03/11/12  1:42 AM      Result Value Range   Preg Test, Ur NEGATIVE  NEGATIVE   Comment:            THE SENSITIVITY OF THIS     METHODOLOGY IS >24 mIU/mL  LIPASE, BLOOD  Status: Abnormal   Collection Time    03/11/12 10:47 AM      Result Value Range   Lipase >3000 (*) 11 - 59 U/L  COMPREHENSIVE METABOLIC PANEL     Status: Abnormal   Collection Time    03/11/12 10:47 AM      Result Value Range   Sodium 142  135 - 145 mEq/L   Potassium 3.5  3.5 - 5.1 mEq/L   Chloride 108  96 - 112 mEq/L   CO2 26   19 - 32 mEq/L   Glucose, Bld 112 (*) 70 - 99 mg/dL   BUN 8  6 - 23 mg/dL   Creatinine, Ser 0.45  0.50 - 1.10 mg/dL   Calcium 9.7  8.4 - 40.9 mg/dL   Total Protein 6.7  6.0 - 8.3 g/dL   Albumin 3.3 (*) 3.5 - 5.2 g/dL   AST 811 (*) 0 - 37 U/L   ALT 583 (*) 0 - 35 U/L   Alkaline Phosphatase 213 (*) 39 - 117 U/L   Total Bilirubin 3.2 (*) 0.3 - 1.2 mg/dL   GFR calc non Af Amer 87 (*) >90 mL/min   GFR calc Af Amer >90  >90 mL/min   Comment:            The eGFR has been calculated     using the CKD EPI equation.     This calculation has not been     validated in all clinical     situations.     eGFR's persistently     <90 mL/min signify     possible Chronic Kidney Disease.  CBC WITH DIFFERENTIAL     Status: Abnormal   Collection Time    03/11/12 10:47 AM      Result Value Range   WBC 5.6  4.0 - 10.5 K/uL   RBC 5.09  3.87 - 5.11 MIL/uL   Hemoglobin 13.3  12.0 - 15.0 g/dL   HCT 91.4  78.2 - 95.6 %   MCV 77.2 (*) 78.0 - 100.0 fL   MCH 26.1  26.0 - 34.0 pg   MCHC 33.8  30.0 - 36.0 g/dL   RDW 21.3  08.6 - 57.8 %   Platelets 248  150 - 400 K/uL   Neutrophils Relative 61  43 - 77 %   Neutro Abs 3.5  1.7 - 7.7 K/uL   Lymphocytes Relative 29  12 - 46 %   Lymphs Abs 1.6  0.7 - 4.0 K/uL   Monocytes Relative 8  3 - 12 %   Monocytes Absolute 0.5  0.1 - 1.0 K/uL   Eosinophils Relative 1  0 - 5 %   Eosinophils Absolute 0.1  0.0 - 0.7 K/uL   Basophils Relative 0  0 - 1 %   Basophils Absolute 0.0  0.0 - 0.1 K/uL   No results found.     Assessment/Plan 1. Gallstone pancreatitis 2. 6 weeks postpartum  3. transaminitis 4. Hyperbilirubinemia  Plan: 1. The patient currently feels much better and has minimal pain.  We suspect that her gallstone has likely passed that caused her pancreatitis; however, due to her elevated LFTs an MRCP has been ordered.  We will await these results to see if they patient needs a pre-op ERCP or if we can proceed with surgical intervention.  If there is no  evidence of choledocholithiasis and her pancreatitis is resolving, we can look to move forward with a cholecystectomy as soon as possibly tomorrow.  Recheck labs in the morning.  May have clears today after MRCP and then NPO p MN.  We will follow along.  Emigdio Wildeman E 03/11/2012, 1:19 PM Pager: [phone]

## 2012-03-11 NOTE — H&P (Signed)
  Chief Complaint:  Abd pain  HPI: 27 yo healthy aaf who is 6 weeks postpartum svd of healthy female who suffered from biliary colic during her pregnancy that normally lasted only an hour or so then would resolve.  However today, she started having epigastric abd pain/ruq abd pain with n/v that has persisted for more than 12 hours.  No fevers.  No blood in vomit.  No diarrhea.  She feels better with ivf and nausea/pain meds in ED.  She is not breastfeeding.  Review of Systems:  Positive and negative as per HPI otherwise all other systems are negative  Past Medical History: Past Medical History  Diagnosis Date  . No pertinent past medical history   . Headache   . Gallstones    Past Surgical History  Procedure Laterality Date  . No past surgeries      Medications: Prior to Admission medications   Medication Sig Start Date End Date Taking? Authorizing Provider  Prenatal Vit-Fe Fumarate-FA (PRENATAL MULTIVITAMIN) TABS Take 1 tablet by mouth daily.   Yes Historical Provider, MD    Allergies:  No Known Allergies  Social History:  reports that she has quit smoking. Her smoking use included Cigarettes. She smoked 0.00 packs per day. She has never used smokeless tobacco. She reports that she does not drink alcohol or use illicit drugs.  Family History: Family History  Problem Relation Age of Onset  . Anesthesia problems Neg Hx   . Hypotension Neg Hx   . Malignant hyperthermia Neg Hx   . Pseudochol deficiency Neg Hx   . Diabetes Maternal Grandmother     Physical Exam: Filed Vitals:   03/11/12 0038 03/11/12 0415  BP: 149/87 140/82  Pulse: 91 90  Temp: 98 F (36.7 C)   TempSrc: Oral   Resp: 18 18  SpO2: 97% 98%   General appearance: alert, cooperative and no distress Neck: no JVD and supple, symmetrical, trachea midline Lungs: clear to auscultation bilaterally Heart: regular rate and rhythm, S1, S2 normal, no murmur, click, rub or gallop Abdomen: soft, non-tender; bowel  sounds normal; no masses,  no organomegaly Extremities: extremities normal, atraumatic, no cyanosis or edema Pulses: 2+ and symmetric Skin: Skin color, texture, turgor normal. No rashes or lesions Neurologic: Grossly normal    Labs on Admission:   Recent Labs  03/11/12 0056  NA 141  K 3.8  CL 105  CO2 25  GLUCOSE 123*  BUN 9  CREATININE 0.78  CALCIUM 10.4    Recent Labs  03/11/12 0056  AST 731*  ALT 573*  ALKPHOS 212*  BILITOT 2.7*  PROT 7.9  ALBUMIN 3.9    Recent Labs  03/11/12 0056  LIPASE 1151*    Recent Labs  03/11/12 0056  WBC 8.1  NEUTROABS 5.7  HGB 14.7  HCT 41.9  MCV 76.2*  PLT 288    Radiological Exams on Admission: No results found.  Assessment/Plan  27 yo female gallstone pancreatitis Principal Problem:   Gallstone pancreatitis Active Problems:   Abdominal pain, acute   Nausea & vomiting   Postpartum state  Ivf, npo x ice chips.  Iv pain meds and antiemetics.  Gi will see in am for possible ercp.  Eventually will need chole.  Full code.  Med bed.  Sherre Wooton A 03/11/2012, 4:22 AM

## 2012-03-11 NOTE — ED Notes (Signed)
Patient presents with abdominal pain, nausea, vomiting & unable to keep food/drink down x 1 day. Patient is 6 weeks post-partum. Found out during pregnancy that she had gallstones

## 2012-03-11 NOTE — ED Provider Notes (Signed)
History     CSN: [id number]  Arrival date & time 03/11/12  0034   First MD Initiated Contact with Patient 03/11/12 0116      Chief Complaint  Patient presents with  . Abdominal Pain    (Consider location/radiation/quality/duration/timing/severity/associated sxs/prior treatment) HPI Comments: Patient with history of cholelithiasis diagnosed several months ago while pregnant.  Presents with 24 hour history of epigastric cramping that feels similar to the previous episode.  She denies fever or chills.  She reports vomiting but no diarrhea.  Pain worse with eating and reports has been unable to keep anything down since the onset of symptoms.    Patient is a 27 y.o. female presenting with abdominal pain. The history is provided by the patient.  Abdominal Pain Pain location:  Epigastric Pain quality: cramping   Pain radiates to:  Does not radiate Pain severity:  Moderate Onset quality: intermittent. Timing:  Intermittent Progression:  Worsening Chronicity:  Recurrent Relieved by:  Nothing Ineffective treatments:  None tried   Past Medical History  Diagnosis Date  . No pertinent past medical history   . Headache   . Gallstones     Past Surgical History  Procedure Laterality Date  . No past surgeries      Family History  Problem Relation Age of Onset  . Anesthesia problems Neg Hx   . Hypotension Neg Hx   . Malignant hyperthermia Neg Hx   . Pseudochol deficiency Neg Hx   . Diabetes Maternal Grandmother     History  Substance Use Topics  . Smoking status: Former Smoker    Types: Cigarettes  . Smokeless tobacco: Never Used  . Alcohol Use: No    OB History   Grav Para Term Preterm Abortions TAB SAB Ect Mult Living   1 1 1  0 0 0 0 0 0 1      Review of Systems  Gastrointestinal: Positive for abdominal pain.  All other systems reviewed and are negative.    Allergies  Review of patient's allergies indicates no known allergies.  Home Medications   Current  Outpatient Rx  Name  Route  Sig  Dispense  Refill  . ibuprofen (ADVIL,MOTRIN) 100 MG/5ML suspension   Oral   Take 40 mLs (800 mg total) by mouth every 8 (eight) hours as needed for pain or fever.   840 mL   1   . oxyCODONE-acetaminophen (ROXICET) 5-325 MG/5ML solution   Oral   Take 5 mLs by mouth every 4 (four) hours as needed.   60 mL   0   . Prenatal Vit-Fe Fumarate-FA (PRENATAL MULTIVITAMIN) TABS   Oral   Take 1 tablet by mouth daily.           BP 149/87  Pulse 91  Temp(Src) 98 F (36.7 C) (Oral)  Resp 18  SpO2 97%  LMP 03/04/2012  Breastfeeding? No  Physical Exam  Nursing note and vitals reviewed. Constitutional: She is oriented to person, place, and time. She appears well-developed and well-nourished. No distress.  HENT:  Head: Normocephalic and atraumatic.  Neck: Normal range of motion. Neck supple.  Cardiovascular: Normal rate and regular rhythm.  Exam reveals no gallop and no friction rub.   No murmur heard. Pulmonary/Chest: Effort normal and breath sounds normal. No respiratory distress. She has no wheezes.  Abdominal: Soft. Bowel sounds are normal. She exhibits no distension.  There is mild ttp in the right upper quadrant and epigastric region.    Musculoskeletal: Normal range of motion.  Neurological: She is alert and oriented to person, place, and time.  Skin: Skin is warm and dry. She is not diaphoretic.    ED Course  Procedures (including critical care time)  Labs Reviewed  LIPASE, BLOOD  CBC WITH DIFFERENTIAL  COMPREHENSIVE METABOLIC PANEL  URINALYSIS, MICROSCOPIC ONLY   No results found.   No diagnosis found.    MDM  The cmp shows elevations of transaminases, alk phos, and bilirubin.  The lipase is elevated at 1100.  This appears to be a case of gallstone pancreatitis.  I spoke with Dr. Dulce Sellar who would like to have the patient admitted to medicine.  Dr. Onalee Hua has been consulted and will admit the patient.        Geoffery Lyons,  MD 03/11/12 [phone]

## 2012-03-12 ENCOUNTER — Encounter (HOSPITAL_COMMUNITY)
Admission: EM | Disposition: A | Discharge status: Home / Self Care | Payer: Self-pay | Source: Home / Self Care | Attending: Internal Medicine

## 2012-03-12 ENCOUNTER — Inpatient Hospital Stay (HOSPITAL_COMMUNITY): Payer: BC Managed Care – PPO | Admitting: Anesthesiology

## 2012-03-12 ENCOUNTER — Encounter (HOSPITAL_COMMUNITY): Payer: Self-pay | Admitting: Anesthesiology

## 2012-03-12 HISTORY — PX: CHOLECYSTECTOMY: SHX55

## 2012-03-12 LAB — CBC
HCT: 39.9 % (ref 36.0–46.0)
MCHC: 32.6 g/dL (ref 30.0–36.0)
MCV: 77.6 fL — ABNORMAL LOW (ref 78.0–100.0)
Platelets: 238 10*3/uL (ref 150–400)
RDW: 13.7 % (ref 11.5–15.5)
WBC: 6.5 10*3/uL (ref 4.0–10.5)

## 2012-03-12 LAB — URINE CULTURE

## 2012-03-12 LAB — COMPREHENSIVE METABOLIC PANEL
ALT: 421 U/L — ABNORMAL HIGH (ref 0–35)
Alkaline Phosphatase: 208 U/L — ABNORMAL HIGH (ref 39–117)
BUN: 8 mg/dL (ref 6–23)
Chloride: 103 mEq/L (ref 96–112)
GFR calc Af Amer: >90 mL/min (ref >90)
Glucose, Bld: 73 mg/dL (ref 70–99)
Potassium: 3.2 mEq/L — ABNORMAL LOW (ref 3.5–5.1)
Sodium: 138 mEq/L (ref 135–145)
Total Bilirubin: 0.8 mg/dL (ref 0.3–1.2)
Total Protein: 6.7 g/dL (ref 6.0–8.3)

## 2012-03-12 SURGERY — LAPAROSCOPIC CHOLECYSTECTOMY
Anesthesia: General | Site: Abdomen | Wound class: Clean Contaminated

## 2012-03-12 MED ORDER — DEXTROSE 5 % IV SOLN
3.0000 g | Freq: Once | INTRAVENOUS | Status: AC
  Administered 2012-03-12: 3 g via INTRAVENOUS
  Filled 2012-03-12: qty 3000

## 2012-03-12 MED ORDER — BUPIVACAINE-EPINEPHRINE 0.25% -1:200000 IJ SOLN
INTRAMUSCULAR | Status: DC | PRN
  Administered 2012-03-12: 50 mL

## 2012-03-12 MED ORDER — FENTANYL CITRATE 0.05 MG/ML IJ SOLN: 25.0000 ug | INTRAMUSCULAR | Status: DC | PRN

## 2012-03-12 MED ORDER — PROMETHAZINE HCL 25 MG/ML IJ SOLN: 6.2500 mg | INTRAMUSCULAR | Status: DC | PRN

## 2012-03-12 MED ORDER — LACTATED RINGERS IV SOLN: INTRAVENOUS | Status: DC

## 2012-03-12 MED ORDER — LIDOCAINE HCL (CARDIAC) 20 MG/ML IV SOLN
INTRAVENOUS | Status: DC | PRN
  Administered 2012-03-12: 100 mg via INTRAVENOUS

## 2012-03-12 MED ORDER — MIDAZOLAM HCL 5 MG/5ML IJ SOLN
INTRAMUSCULAR | Status: DC | PRN
  Administered 2012-03-12 (×2): 1 mg via INTRAVENOUS

## 2012-03-12 MED ORDER — FENTANYL CITRATE 0.05 MG/ML IJ SOLN
INTRAMUSCULAR | Status: DC | PRN
  Administered 2012-03-12: 100 ug via INTRAVENOUS
  Administered 2012-03-12: 50 ug via INTRAVENOUS
  Administered 2012-03-12: 100 ug via INTRAVENOUS

## 2012-03-12 MED ORDER — ONDANSETRON HCL 4 MG/2ML IJ SOLN
INTRAMUSCULAR | Status: DC | PRN
  Administered 2012-03-12: 4 mg via INTRAVENOUS

## 2012-03-12 MED ORDER — PROPOFOL 10 MG/ML IV BOLUS
INTRAVENOUS | Status: DC | PRN
  Administered 2012-03-12: 200 mg via INTRAVENOUS

## 2012-03-12 MED ORDER — HYDROMORPHONE HCL PF 1 MG/ML IJ SOLN
INTRAMUSCULAR | Status: AC
  Administered 2012-03-12: 13:00:00
  Filled 2012-03-12: qty 1

## 2012-03-12 MED ORDER — ARTIFICIAL TEARS OP OINT
TOPICAL_OINTMENT | OPHTHALMIC | Status: DC | PRN
  Administered 2012-03-12: 1 via OPHTHALMIC

## 2012-03-12 MED ORDER — DEXAMETHASONE SODIUM PHOSPHATE 4 MG/ML IJ SOLN
INTRAMUSCULAR | Status: DC | PRN
  Administered 2012-03-12: 4 mg via INTRAVENOUS

## 2012-03-12 MED ORDER — KETOROLAC TROMETHAMINE 30 MG/ML IJ SOLN
30.0000 mg | Freq: Once | INTRAMUSCULAR | Status: AC
  Administered 2012-03-12: 30 mg via INTRAVENOUS

## 2012-03-12 MED ORDER — MEPERIDINE HCL 25 MG/ML IJ SOLN: 6.2500 mg | INTRAMUSCULAR | Status: DC | PRN

## 2012-03-12 MED ORDER — NEOSTIGMINE METHYLSULFATE 1 MG/ML IJ SOLN
INTRAMUSCULAR | Status: DC | PRN
  Administered 2012-03-12: 1 mg via INTRAVENOUS
  Administered 2012-03-12: 4 mg via INTRAVENOUS

## 2012-03-12 MED ORDER — SODIUM CHLORIDE 0.9 % IR SOLN
Status: DC | PRN
  Administered 2012-03-12: 1000 mL

## 2012-03-12 MED ORDER — SODIUM CHLORIDE 0.9 % IV SOLN
INTRAVENOUS | Status: DC | PRN
  Administered 2012-03-12: 11:00:00

## 2012-03-12 MED ORDER — SODIUM CHLORIDE 0.9 % IV SOLN
INTRAVENOUS | Status: DC
  Administered 2012-03-12 – 2012-03-13 (×3): via INTRAVENOUS

## 2012-03-12 MED ORDER — HYDROMORPHONE HCL PF 1 MG/ML IJ SOLN
1.0000 mg | INTRAMUSCULAR | Status: DC | PRN
  Administered 2012-03-12 – 2012-03-13 (×3): 1 mg via INTRAVENOUS
  Filled 2012-03-12 (×4): qty 1

## 2012-03-12 MED ORDER — GLYCOPYRROLATE 0.2 MG/ML IJ SOLN
INTRAMUSCULAR | Status: DC | PRN
  Administered 2012-03-12: 0.2 mg via INTRAVENOUS
  Administered 2012-03-12: .7 mg via INTRAVENOUS

## 2012-03-12 MED ORDER — ROCURONIUM BROMIDE 100 MG/10ML IV SOLN
INTRAVENOUS | Status: DC | PRN
  Administered 2012-03-12: 10 mg via INTRAVENOUS
  Administered 2012-03-12: 25 mg via INTRAVENOUS

## 2012-03-12 MED ORDER — HYDROCODONE-ACETAMINOPHEN 5-325 MG PO TABS
1.0000 | ORAL_TABLET | ORAL | Status: DC | PRN
  Filled 2012-03-12: qty 2

## 2012-03-12 MED ORDER — LACTATED RINGERS IV SOLN
INTRAVENOUS | Status: DC | PRN
  Administered 2012-03-12: 10:00:00 via INTRAVENOUS

## 2012-03-12 SURGICAL SUPPLY — 41 items
APL SKNCLS STERI-STRIP NONHPOA (GAUZE/BANDAGES/DRESSINGS) ×2
APPLIER CLIP 5 13 M/L LIGAMAX5 (MISCELLANEOUS) ×3
APR CLP MED LRG 5 ANG JAW (MISCELLANEOUS) ×1
BAG SPEC RTRVL LRG 6X4 10 (ENDOMECHANICALS)
BANDAGE ADHESIVE 1X3 (GAUZE/BANDAGES/DRESSINGS) ×12 IMPLANT
BENZOIN TINCTURE PRP APPL 2/3 (GAUZE/BANDAGES/DRESSINGS) ×6 IMPLANT
CANISTER SUCTION 2500CC (MISCELLANEOUS) ×3 IMPLANT
CHLORAPREP W/TINT 26ML (MISCELLANEOUS) ×3 IMPLANT
CLIP APPLIE 5 13 M/L LIGAMAX5 (MISCELLANEOUS) ×2 IMPLANT
CLOTH BEACON ORANGE TIMEOUT ST (SAFETY) ×3 IMPLANT
CLSR STERI-STRIP ANTIMIC 1/2X4 (GAUZE/BANDAGES/DRESSINGS) ×3 IMPLANT
COVER MAYO STAND STRL (DRAPES) ×3 IMPLANT
COVER SURGICAL LIGHT HANDLE (MISCELLANEOUS) ×3 IMPLANT
DECANTER SPIKE VIAL GLASS SM (MISCELLANEOUS) ×3 IMPLANT
DRAPE C-ARM 42X72 X-RAY (DRAPES) ×3 IMPLANT
ELECT REM PT RETURN 9FT ADLT (ELECTROSURGICAL) ×3
ELECTRODE REM PT RTRN 9FT ADLT (ELECTROSURGICAL) ×2 IMPLANT
GLOVE BIOGEL PI IND STRL 7.0 (GLOVE) ×2 IMPLANT
GLOVE BIOGEL PI INDICATOR 7.0 (GLOVE) ×1
GLOVE SS BIOGEL STRL SZ 7 (GLOVE) ×2 IMPLANT
GLOVE SUPERSENSE BIOGEL SZ 7 (GLOVE) ×1
GLOVE SURG SIGNA 7.5 PF LTX (GLOVE) ×3 IMPLANT
GOWN PREVENTION PLUS XLARGE (GOWN DISPOSABLE) ×3 IMPLANT
GOWN STRL NON-REIN LRG LVL3 (GOWN DISPOSABLE) ×9 IMPLANT
KIT BASIN OR (CUSTOM PROCEDURE TRAY) ×3 IMPLANT
KIT ROOM TURNOVER OR (KITS) ×3 IMPLANT
NS IRRIG 1000ML POUR BTL (IV SOLUTION) ×3 IMPLANT
PAD ARMBOARD 7.5X6 YLW CONV (MISCELLANEOUS) ×3 IMPLANT
POUCH SPECIMEN RETRIEVAL 10MM (ENDOMECHANICALS) IMPLANT
SCISSORS LAP 5X35 DISP (ENDOMECHANICALS) IMPLANT
SET CHOLANGIOGRAPH 5 50 .035 (SET/KITS/TRAYS/PACK) IMPLANT
SET IRRIG TUBING LAPAROSCOPIC (IRRIGATION / IRRIGATOR) ×3 IMPLANT
SLEEVE ENDOPATH XCEL 5M (ENDOMECHANICALS) ×6 IMPLANT
SPECIMEN JAR SMALL (MISCELLANEOUS) ×3 IMPLANT
SUT MNCRL AB 4-0 PS2 18 (SUTURE) ×3 IMPLANT
SUT MON AB 4-0 PC3 18 (SUTURE) ×3 IMPLANT
TOWEL OR 17X24 6PK STRL BLUE (TOWEL DISPOSABLE) ×3 IMPLANT
TOWEL OR 17X26 10 PK STRL BLUE (TOWEL DISPOSABLE) ×3 IMPLANT
TRAY LAPAROSCOPIC (CUSTOM PROCEDURE TRAY) ×3 IMPLANT
TROCAR XCEL BLUNT TIP 100MML (ENDOMECHANICALS) ×3 IMPLANT
TROCAR XCEL NON-BLD 5MMX100MML (ENDOMECHANICALS) ×3 IMPLANT

## 2012-03-12 NOTE — Anesthesia Procedure Notes (Signed)
Procedure Name: Intubation Date/Time: 03/12/2012 10:39 AM Performed by: Gayla Medicus Pre-anesthesia Checklist: Patient identified, Timeout performed, Emergency Drugs available, Suction available and Patient being monitored Patient Re-evaluated:Patient Re-evaluated prior to inductionOxygen Delivery Method: Circle system utilized Preoxygenation: Pre-oxygenation with 100% oxygen Intubation Type: IV induction Ventilation: Mask ventilation without difficulty and Oral airway inserted - appropriate to patient size Laryngoscope Size: Mac and 4 Grade View: Grade I Tube type: Oral Tube size: 7.5 mm Number of attempts: 1 Airway Equipment and Method: Stylet Placement Confirmation: ETT inserted through vocal cords under direct vision,  positive ETCO2 and breath sounds checked- equal and bilateral Secured at: 23 cm Tube secured with: Tape Dental Injury: Teeth and Oropharynx as per pre-operative assessment

## 2012-03-12 NOTE — Op Note (Addendum)
Laparoscopic Cholecystectomy Procedure Note  Indications: This patient Presents with gallstone pancreatitis. Her liver function tests are returning to normal. A preoperative MRCP showed no evidence of obstructing stone.  Pre-operative Diagnosis: gallstone pancreatitis  Post-operative Diagnosis: Same  Surgeon: Abigail Miyamoto A   Assistants: 0  Anesthesia: General endotracheal anesthesia  ASA Class: 2  Procedure Details  The patient was seen again in the Holding Room. The risks, benefits, complications, treatment options, and expected outcomes were discussed with the patient. The possibilities of reaction to medication, pulmonary aspiration, perforation of viscus, bleeding, recurrent infection, finding a normal gallbladder, the need for additional procedures, failure to diagnose a condition, the possible need to convert to an open procedure, and creating a complication requiring transfusion or operation were discussed with the patient. The likelihood of improving the patient's symptoms with return to their baseline status is good.  The patient and/or family concurred with the proposed plan, giving informed consent. The site of surgery properly noted. The patient was taken to Operating Room, identified as Rowe Pavy and the procedure verified as Laparoscopic Cholecystectomy with Intraoperative Cholangiogram. A Time Out was held and the above information confirmed.  Prior to the induction of general anesthesia, antibiotic prophylaxis was administered. General endotracheal anesthesia was then administered and tolerated well. After the induction, the abdomen was prepped with Chloraprep and draped in sterile fashion. The patient was positioned in the supine position.  Local anesthetic agent was injected into the skin near the umbilicus and an incision made. We dissected down to the abdominal fascia with blunt dissection.  The fascia was incised vertically and we entered the peritoneal cavity  bluntly.  A pursestring suture of 0-Vicryl was placed around the fascial opening.  The Hasson cannula was inserted and secured with the stay suture.  Pneumoperitoneum was then created with CO2 and tolerated well without any adverse changes in the patient's vital signs. An 11-mm port was placed in the subxiphoid position.  Two 5-mm ports were placed in the right upper quadrant. All skin incisions were infiltrated with a local anesthetic agent before making the incision and placing the trocars.   We positioned the patient in reverse Trendelenburg, tilted slightly to the patient's left.  The gallbladder was identified, the fundus grasped and retracted cephalad. Adhesions were lysed bluntly and with the electrocautery where indicated, taking care not to injure any adjacent organs or viscus. The infundibulum was grasped and retracted laterally, exposing the peritoneum overlying the triangle of Calot. This was then divided and exposed in a blunt fashion. The cystic duct was clearly identified and bluntly dissected circumferentially. A critical view of the cystic duct and cystic artery was obtained.  I made a small opening in the cystic duct with the laparoscopic scissors. I inserted a cholangiocatheter through a small incision in the right upper quadrant under direct vision. I made multiple attempts to thread the catheter into the tiny cystic duct without success. Given this, the decision was made to forego cholangiogram.The cystic duct was then ligated with clips and divided. The cystic artery was, dissected free, ligated with clips and divided as well.   The gallbladder was dissected from the liver bed in retrograde fashion with the electrocautery. The gallbladder was removed and placed in an Endocatch sac. The liver bed was irrigated and inspected. Hemostasis was achieved with the electrocautery. Copious irrigation was utilized and was repeatedly aspirated until clear.  The gallbladder and Endocatch sac were then  removed through the umbilical port site.  The pursestring suture was  used to close the umbilical fascia.    We again inspected the right upper quadrant for hemostasis.  Pneumoperitoneum was released as we removed the trocars.  4-0 Monocryl was used to close the skin.   Benzoin, steri-strips, and clean dressings were applied. The patient was then extubated and brought to the recovery room in stable condition. Instrument, sponge, and needle counts were correct at closure and at the conclusion of the case.   Findings: Cholecystitis with Cholelithiasis.  The cystic duct was very tiny. Despite multiple attempts, I was unable to thread a cholangiocatheter down and perform a cholangiogram.  Estimated Blood Loss: Minimal         Drains: 0         Specimens: Gallbladder           Complications: None; patient tolerated the procedure well.         Disposition: PACU - hemodynamically stable.         Condition: stable   Addendum:  Assist:  Aris Georgia, PA

## 2012-03-12 NOTE — Anesthesia Preprocedure Evaluation (Addendum)
Anesthesia Evaluation  Patient identified by MRN, date of birth, ID band Patient awake    Reviewed: Allergy & Precautions, H&P , NPO status , Patient's Chart, lab work & pertinent test results  Airway Mallampati: II TM Distance: >3 FB     Dental  (+) Teeth Intact and Dental Advisory Given   Pulmonary neg pulmonary ROS,          Cardiovascular negative cardio ROS      Neuro/Psych  Headaches,    GI/Hepatic negative GI ROS,   Endo/Other  Morbid obesity  Renal/GU      Musculoskeletal   Abdominal   Peds  Hematology negative hematology ROS (+)   Anesthesia Other Findings   Reproductive/Obstetrics                           Anesthesia Physical Anesthesia Plan  ASA: III  Anesthesia Plan: General   Post-op Pain Management:    Induction: Intravenous  Airway Management Planned: Oral ETT  Additional Equipment:   Intra-op Plan:   Post-operative Plan: Extubation in OR  Informed Consent: I have reviewed the patients History and Physical, chart, labs and discussed the procedure including the risks, benefits and alternatives for the proposed anesthesia with the patient or authorized representative who has indicated his/her understanding and acceptance.   Dental advisory given  Plan Discussed with: Anesthesiologist and Surgeon  Anesthesia Plan Comments:        Anesthesia Quick Evaluation

## 2012-03-12 NOTE — Transfer of Care (Signed)
Immediate Anesthesia Transfer of Care Note  Patient: Whitney [name]  Procedure(s) Performed: Procedure(s): LAPAROSCOPIC CHOLECYSTECTOMY (N/A)  Patient Location: PACU  Anesthesia Type:General  Level of Consciousness: awake, alert  and oriented  Airway & Oxygen Therapy: Patient Spontanous Breathing and Patient connected to nasal cannula oxygen  Post-op Assessment: Report given to PACU RN, Post -op Vital signs reviewed and stable and Patient moving all extremities X 4  Post vital signs: Reviewed and stable  Complications: No apparent anesthesia complications

## 2012-03-12 NOTE — Anesthesia Postprocedure Evaluation (Signed)
  Anesthesia Post-op Note  Patient: Whitney [name]  Procedure(s) Performed: Procedure(s) (LRB): LAPAROSCOPIC CHOLECYSTECTOMY (N/A)  Patient Location: PACU  Anesthesia Type: General  Level of Consciousness: awake and alert   Airway and Oxygen Therapy: Patient Spontanous Breathing  Post-op Pain: mild  Post-op Assessment: Post-op Vital signs reviewed, Patient's Cardiovascular Status Stable, Respiratory Function Stable, Patent Airway and No signs of Nausea or vomiting  Last Vitals:  Filed Vitals:   03/12/12 1145  BP: 141/88  Pulse: 62  Temp:   Resp: 15    Post-op Vital Signs: stable   Complications: No apparent anesthesia complications

## 2012-03-12 NOTE — Progress Notes (Signed)
[facility] PROGRESS NOTE  Whitney [name] JYN:[id number] DOB: Nov 26, 1985 DOA: 03/11/2012 PCP: Default, Provider, MD  Assessment/Plan: 1. Acute pancreatitis - likely secondary to gallstones.  - MRCP is negative for CBD dilation, positive for gallstones. - LFTs improving today.  - Plan for laparoscopic cholecystectomy today with intraoperative cholangiogram. We'll followup the results.   Code Status: Full Family Communication: none  Disposition Plan: pending MRCP  Consultants:  Gastroenterology  Surgery  Procedures:  none  Antibiotics:  None   HPI/Subjective: - Feels well this morning, denies any nausea or abdominal pain, awaiting surgery  Objective: Filed Vitals:   03/11/12 0415 03/11/12 0452 03/11/12 2222 04/06/2012 0610  BP: 140/82 134/77 141/93 138/87  Pulse: 90 91 89 73  Temp:  97.9 F (36.6 C) 97.1 F (36.2 C) 98.3 F (36.8 C)  TempSrc:  Oral Oral Oral  Resp: 18 18 18 18   Height:  5\' 11"  (1.803 m)    Weight:  155.5 kg (342 lb 13 oz)    SpO2: 98% 97% 99% 98%    Intake/Output Summary (Last 24 hours) at 06-Apr-2012 1015 Last data filed at April 06, 2012 0446  Gross per 24 hour  Intake 2454.58 ml  Output      0 ml  Net 2454.58 ml   Filed Weights   03/11/12 0452  Weight: 155.5 kg (342 lb 13 oz)    Exam:   General:  She is in no acute distress  Cardiovascular: Regular rate and rhythm, without murmurs rubs or gallops  Respiratory: Clear to auscultation bilaterally  Abdomen: Soft nontender to palpation  Data Reviewed: Basic Metabolic Panel:  Recent Labs Lab 03/11/12 0056 03/11/12 1047 04-06-2012 0515  NA 141 142 138  K 3.8 3.5 3.2*  CL 105 108 103  CO2 25 26 25   GLUCOSE 123* 112* 73  BUN 9 8 8   CREATININE 0.78 0.90 0.83  CALCIUM 10.4 9.7 9.5   Liver Function Tests:  Recent Labs Lab 03/11/12 0056 03/11/12 1047 06-Apr-2012 0515  AST 731* 551* 178*  ALT 573* 583* 421*  ALKPHOS 212* 213* 208*  BILITOT 2.7* 3.2* 0.8  PROT 7.9 6.7 6.7   ALBUMIN 3.9 3.3* 3.2*    Recent Labs Lab 03/11/12 0056 03/11/12 1047 04-06-2012 0515  LIPASE 1151* >3000* 928*   CBC:  Recent Labs Lab 03/11/12 0056 03/11/12 1047 04-06-2012 0515  WBC 8.1 5.6 6.5  NEUTROABS 5.7 3.5  --   HGB 14.7 13.3 13.0  HCT 41.9 39.3 39.9  MCV 76.2* 77.2* 77.6*  PLT 288 248 238   Studies: Mr 3d Recon At Scanner  04-06-12  , *RADIOLOGY REPORT*  Clinical Data:  Evaluate gallstone pancreatitis.  MRI ABDOMEN WITHOUT AND WITH CONTRAST (INCLUDING MRCP)  Technique:  Multiplanar multisequence MR imaging of the abdomen was performed both before and after the administration of intravenous contrast. Heavily T2-weighted images of the biliary and pancreatic ducts were obtained, and three-dimensional MRCP images were rendered by post processing.  Contrast: 20mL MULTIHANCE GADOBENATE DIMEGLUMINE 529 MG/ML IV SOLN  Comparison:  None.  Findings:  There is no pericardial or pleural effusion.  No suspicious liver abnormality.  There is mild diffuse fatty infiltration of the liver.  No suspicious liver abnormalities identified.  Multiple small stones are within the lumen of the gall bladder. The common bile duct has a normal caliber measuring 6.3 mm.  There is no significant pancreatic ductal dilatation.  There is diffuse peripancreatic edema.  However, there is uniform enhancement of the pancreas following contrast administration.  No evidence for pancreatic necrosis.  No pancreatic fluid collections or peripancreatic fluid collections to suggest pseudocyst.  The portal vein is patent.  The splenic vein is also patent.  Normal appearance of both kidneys.  No significant free fluid or fluid collections within the abdomen or pelvis.  No adenopathy identified.  IMPRESSION:  1.  Pancreatitis. 2.  Gallstones. 3.  Normal caliber and course of the common bile duct and pancreatic duct without evidence for choledocholithiasis.   Original Report Authenticated By: Signa Kell, M.D.    Mr Abd  W/wo Cm/mrcp  03/12/2012  , *RADIOLOGY REPORT*  Clinical Data:  Evaluate gallstone pancreatitis.  MRI ABDOMEN WITHOUT AND WITH CONTRAST (INCLUDING MRCP)  Technique:  Multiplanar multisequence MR imaging of the abdomen was performed both before and after the administration of intravenous contrast. Heavily T2-weighted images of the biliary and pancreatic ducts were obtained, and three-dimensional MRCP images were rendered by post processing.  Contrast: 20mL MULTIHANCE GADOBENATE DIMEGLUMINE 529 MG/ML IV SOLN  Comparison:  None.  Findings:  There is no pericardial or pleural effusion.  No suspicious liver abnormality.  There is mild diffuse fatty infiltration of the liver.  No suspicious liver abnormalities identified.  Multiple small stones are within the lumen of the gall bladder. The common bile duct has a normal caliber measuring 6.3 mm.  There is no significant pancreatic ductal dilatation.  There is diffuse peripancreatic edema.  However, there is uniform enhancement of the pancreas following contrast administration.  No evidence for pancreatic necrosis.  No pancreatic fluid collections or peripancreatic fluid collections to suggest pseudocyst.  The portal vein is patent.  The splenic vein is also patent.  Normal appearance of both kidneys.  No significant free fluid or fluid collections within the abdomen or pelvis.  No adenopathy identified.  IMPRESSION:  1.  Pancreatitis. 2.  Gallstones. 3.  Normal caliber and course of the common bile duct and pancreatic duct without evidence for choledocholithiasis.   Original Report Authenticated By: Signa Kell, M.D.     Scheduled Meds: . [MAR HOLD]  ceFAZolin (ANCEF) IV  3 g Intravenous Once  . [facility] HOLD] enoxaparin (LOVENOX) injection  60 mg Subcutaneous Q24H   Continuous Infusions: . sodium chloride 100 mL/hr at 03/12/12 1610    Principal Problem:   Gallstone pancreatitis Active Problems:   Abdominal pain, acute   Nausea & vomiting   Postpartum  state  Whitney [name]  [facility] Pager [phone]. If 7 PM - 7 AM, please contact night-coverage at [email or web address], password [email or web address] 03/12/2012, 10:15 AM  LOS: 1 day

## 2012-03-12 NOTE — Preoperative (Signed)
Beta Blockers   Reason not to administer Beta Blockers: not prescribed 

## 2012-03-12 NOTE — Progress Notes (Signed)
  Subjective: Still with some mild epigastric abdominal pain but improved overall  Objective: Vital signs in last 24 hours: Temp:  [97.1 F (36.2 C)-98.3 F (36.8 C)] 98.3 F (36.8 C) (02/18 0610) Pulse Rate:  [73-89] 73 (02/18 0610) Resp:  [18] 18 (02/18 0610) BP: (138-141)/(87-93) 138/87 mmHg (02/18 0610) SpO2:  [98 %-99 %] 98 % (02/18 0610) Last BM Date: 03/10/12  Intake/Output from previous day: 02/17 0701 - 02/18 0700 In: 2454.6 [P.O.:30; I.V.:2424.6] Out: -  Intake/Output this shift:    Abdomen soft, non distended, mild tenderness with guarding in the epigastrium  Lab Results:   Recent Labs  03/11/12 1047 03/12/12 0515  WBC 5.6 6.5  HGB 13.3 13.0  HCT 39.3 39.9  PLT 248 238   BMET  Recent Labs  03/11/12 1047 03/12/12 0515  NA 142 138  K 3.5 3.2*  CL 108 103  CO2 26 25  GLUCOSE 112* 73  BUN 8 8  CREATININE 0.90 0.83  CALCIUM 9.7 9.5   PT/INR No results found for this basename: LABPROT, INR,  in the last 72 hours ABG No results found for this basename: PHART, PCO2, PO2, HCO3,  in the last 72 hours  Studies/Results: No results found.  Anti-infectives: Anti-infectives   None      Assessment/Plan: s/p Procedure(s): ENDOSCOPIC RETROGRADE CHOLANGIOPANCREATOGRAPHY (ERCP) (N/A)  unoffical read of MRCP by radiology negative for CBD stone.  Bilirubin is now normal.  Pancreatitis improving  Plan lap chole with IOC today.  I discussed this with her in detail.  I discussed the risks which include, but are not limited to bleeding, infection, CBD injury, bile leak, injury to other structures, need to convert to an open procedure, etc.  She understands and agrees to proceed.  LOS: 1 day    Saturnino Liew A 03/12/2012

## 2012-03-12 NOTE — Progress Notes (Signed)
Subjective: Abdominal pain much improved.  Objective: Vital signs in last 24 hours: Temp:  [97.1 F (36.2 C)-98.3 F (36.8 C)] 98.3 F (36.8 C) (02/18 0610) Pulse Rate:  [73-89] 73 (02/18 0610) Resp:  [18] 18 (02/18 0610) BP: (138-141)/(87-93) 138/87 mmHg (02/18 0610) SpO2:  [98 %-99 %] 98 % (02/18 0610) Weight change:  Last BM Date: 03/10/12  PE: GEN:  NAD ABD:  Mild epigastric tenderness  Lab Results: CMP     Component Value Date/Time   NA 138 03/12/2012 0515   K 3.2* 03/12/2012 0515   CL 103 03/12/2012 0515   CO2 25 03/12/2012 0515   GLUCOSE 73 03/12/2012 0515   BUN 8 03/12/2012 0515   CREATININE 0.83 03/12/2012 0515   CALCIUM 9.5 03/12/2012 0515   PROT 6.7 03/12/2012 0515   ALBUMIN 3.2* 03/12/2012 0515   AST 178* 03/12/2012 0515   ALT 421* 03/12/2012 0515   ALKPHOS 208* 03/12/2012 0515   BILITOT 0.8 03/12/2012 0515   GFRNONAA >90 03/12/2012 0515   GFRAA >90 03/12/2012 0515    Studies/Results: MRCP:  No choledocholithiasis  Assessment:  1.  Gallstone pancreatitis. 2.  Elevated LFTs, downtrending, suspect passage of CBD stone.  No evidence CBD stone on MRCP.  Plan:  1.  Cholecystectomy with intraoperative cholangiogram today. 2.  ERCP only if stone seen on IOC. 3.  If IOC is negative, will sign-off.   Freddy Jaksch 03/12/2012, 9:46 AM

## 2012-03-13 ENCOUNTER — Encounter (HOSPITAL_COMMUNITY)
Admission: EM | Disposition: A | Discharge status: Home / Self Care | Payer: Self-pay | Source: Home / Self Care | Attending: Internal Medicine

## 2012-03-13 LAB — CBC
HCT: 37.7 % (ref 36.0–46.0)
Hemoglobin: 12.6 g/dL (ref 12.0–15.0)
MCH: 25.9 pg — ABNORMAL LOW (ref 26.0–34.0)
MCV: 77.6 fL — ABNORMAL LOW (ref 78.0–100.0)
RBC: 4.86 MIL/uL (ref 3.87–5.11)

## 2012-03-13 LAB — HEPATIC FUNCTION PANEL
Albumin: 3 g/dL — ABNORMAL LOW (ref 3.5–5.2)
Alkaline Phosphatase: 171 U/L — ABNORMAL HIGH (ref 39–117)
Total Bilirubin: 0.4 mg/dL (ref 0.3–1.2)

## 2012-03-13 LAB — BASIC METABOLIC PANEL
BUN: 6 mg/dL (ref 6–23)
CO2: 26 mEq/L (ref 19–32)
Calcium: 9.6 mg/dL (ref 8.4–10.5)
Creatinine, Ser: 0.8 mg/dL (ref 0.50–1.10)
Glucose, Bld: 85 mg/dL (ref 70–99)
Sodium: 140 mEq/L (ref 135–145)

## 2012-03-13 SURGERY — ERCP, WITH INTERVENTION IF INDICATED
Anesthesia: General

## 2012-03-13 MED ORDER — ONDANSETRON HCL 4 MG PO TABS: 4.0000 mg | ORAL_TABLET | Freq: Four times a day (QID) | ORAL | Status: DC | PRN

## 2012-03-13 MED ORDER — OXYCODONE HCL 5 MG/5ML PO SOLN: 5.0000 mg | ORAL | Status: DC | PRN

## 2012-03-13 MED ORDER — UNABLE TO FIND: Status: DC

## 2012-03-13 NOTE — Progress Notes (Signed)
1 Day Post-Op  Subjective: Pt feeling great, pain much improved since after surgery.  Pt denies N/V fever/chills, no flatus or BM yet, but urinating normally, tolerated clears and will be getting regular diet this am.  Pt ambulating without problems.    Objective: Vital signs in last 24 hours: Temp:  [96.8 F (36 C)-98.9 F (37.2 C)] 98.2 F (36.8 C) (02/19 0544) Pulse Rate:  [57-99] 57 (02/19 0544) Resp:  [10-20] 18 (02/19 0544) BP: (127-147)/(72-89) 139/89 mmHg (02/19 0544) SpO2:  [97 %-100 %] 98 % (02/19 0544) Last BM Date: 03/10/12  Intake/Output from previous day: 02/18 0701 - 02/19 0700 In: 2541.7 [P.O.:480; I.V.:2061.7] Out: -  Intake/Output this shift:    PE: Gen:  Alert, NAD, pleasant Abd: Soft, minimal tenderness, ND, +BS, no HSM, incisions C/D/I   Lab Results:   Recent Labs  03/12/12 0515 03/13/12 0520  WBC 6.5 6.5  HGB 13.0 12.6  HCT 39.9 37.7  PLT 238 223   BMET  Recent Labs  03/12/12 0515 03/13/12 0520  NA 138 140  K 3.2* 3.6  CL 103 108  CO2 25 26  GLUCOSE 73 85  BUN 8 6  CREATININE 0.83 0.80  CALCIUM 9.5 9.6   PT/INR No results found for this basename: LABPROT, INR,  in the last 72 hours CMP     Component Value Date/Time   NA 140 03/13/2012 0520   K 3.6 03/13/2012 0520   CL 108 03/13/2012 0520   CO2 26 03/13/2012 0520   GLUCOSE 85 03/13/2012 0520   BUN 6 03/13/2012 0520   CREATININE 0.80 03/13/2012 0520   CALCIUM 9.6 03/13/2012 0520   PROT 6.3 03/13/2012 0520   ALBUMIN 3.0* 03/13/2012 0520   AST 54* 03/13/2012 0520   ALT 241* 03/13/2012 0520   ALKPHOS 171* 03/13/2012 0520   BILITOT 0.4 03/13/2012 0520   GFRNONAA >90 03/13/2012 0520   GFRAA >90 03/13/2012 0520   Lipase     Component Value Date/Time   LIPASE 928* 03/12/2012 0515       Studies/Results: Mr 3d Recon At Scanner  03/12/2012  , *RADIOLOGY REPORT*  Clinical Data:  Evaluate gallstone pancreatitis.  MRI ABDOMEN WITHOUT AND WITH CONTRAST (INCLUDING MRCP)  Technique:   Multiplanar multisequence MR imaging of the abdomen was performed both before and after the administration of intravenous contrast. Heavily T2-weighted images of the biliary and pancreatic ducts were obtained, and three-dimensional MRCP images were rendered by post processing.  Contrast: 20mL MULTIHANCE GADOBENATE DIMEGLUMINE 529 MG/ML IV SOLN  Comparison:  None.  Findings:  There is no pericardial or pleural effusion.  No suspicious liver abnormality.  There is mild diffuse fatty infiltration of the liver.  No suspicious liver abnormalities identified.  Multiple small stones are within the lumen of the gall bladder. The common bile duct has a normal caliber measuring 6.3 mm.  There is no significant pancreatic ductal dilatation.  There is diffuse peripancreatic edema.  However, there is uniform enhancement of the pancreas following contrast administration.  No evidence for pancreatic necrosis.  No pancreatic fluid collections or peripancreatic fluid collections to suggest pseudocyst.  The portal vein is patent.  The splenic vein is also patent.  Normal appearance of both kidneys.  No significant free fluid or fluid collections within the abdomen or pelvis.  No adenopathy identified.  IMPRESSION:  1.  Pancreatitis. 2.  Gallstones. 3.  Normal caliber and course of the common bile duct and pancreatic duct without evidence for choledocholithiasis.  Original Report Authenticated By: Signa Kell, M.D.    Mr Abd W/wo Cm/mrcp  03/12/2012  , *RADIOLOGY REPORT*  Clinical Data:  Evaluate gallstone pancreatitis.  MRI ABDOMEN WITHOUT AND WITH CONTRAST (INCLUDING MRCP)  Technique:  Multiplanar multisequence MR imaging of the abdomen was performed both before and after the administration of intravenous contrast. Heavily T2-weighted images of the biliary and pancreatic ducts were obtained, and three-dimensional MRCP images were rendered by post processing.  Contrast: 20mL MULTIHANCE GADOBENATE DIMEGLUMINE 529 MG/ML IV SOLN   Comparison:  None.  Findings:  There is no pericardial or pleural effusion.  No suspicious liver abnormality.  There is mild diffuse fatty infiltration of the liver.  No suspicious liver abnormalities identified.  Multiple small stones are within the lumen of the gall bladder. The common bile duct has a normal caliber measuring 6.3 mm.  There is no significant pancreatic ductal dilatation.  There is diffuse peripancreatic edema.  However, there is uniform enhancement of the pancreas following contrast administration.  No evidence for pancreatic necrosis.  No pancreatic fluid collections or peripancreatic fluid collections to suggest pseudocyst.  The portal vein is patent.  The splenic vein is also patent.  Normal appearance of both kidneys.  No significant free fluid or fluid collections within the abdomen or pelvis.  No adenopathy identified.  IMPRESSION:  1.  Pancreatitis. 2.  Gallstones. 3.  Normal caliber and course of the common bile duct and pancreatic duct without evidence for choledocholithiasis.   Original Report Authenticated By: Signa Kell, M.D.     Anti-infectives: Anti-infectives   Start     Dose/Rate Route Frequency Ordered Stop   03/12/12 0830  [MAR Hold]  ceFAZolin (ANCEF) 3 g in dextrose 5 % 50 mL IVPB     (On MAR Hold since 03/12/12 1000)  Comments:  PREOP IN OR HOLDING   3 g 160 mL/hr over 30 Minutes Intravenous  Once 03/12/12 0745 03/12/12 1044       Assessment/Plan GS Pancreatitis, s/p Lap chole POD1 1.  Advance diet, pain mgt, IVF 2.  Ambulation, IS 3.  Can go home today as long as she tolerates her regular diet 4.  Won't need antibiotics from our standpoint, f/u with [facility] in 2-3 weeks -March 4th at 12:30pm  6 weeks post partum Transaminitis - resolving Hyperbilirubinemia - resolving     LOS: 2 days    DORT, Keaun Schnabel 03/13/2012, 7:58 AM Pager: [phone]

## 2012-03-13 NOTE — Progress Notes (Signed)
I have seen and examined the patient and agree with the assessment and plans. As long as she is tolerating liquids, she can go home.  Allisa Einspahr A. Magnus Ivan  MD, FACS

## 2012-03-13 NOTE — Progress Notes (Addendum)
[facility] PROGRESS NOTE  Whitney [name] ZOX:[id number] DOB: 01-12-1986 DOA: 03/11/2012 PCP: Default, Provider, MD  Assessment/Plan: Acute pancreatitis due to gallstones MRCP is negative for CBD dilation, positive for gallstones. LFTs continues to improve. Laparoscopic cholecystectomy on 03-23-12, could not do a cholangiogram during surgery however MRCP was negative. Patient has a outpatient general surgery followup already arranged.  6 weeks postpartum Patient reports that she is not breast feeding. Currently the child is on formula.  Morbid obesity Diet and exercise as outpatient.  Code Status: Full Family Communication: none  Disposition Plan: DC today  Consultants:  Gastroenterology  Surgery  Procedures:  none  Antibiotics:  None   HPI/Subjective: Intermittent abdominal pain. Feeling better. Tolerating a diet.  Objective: Filed Vitals:   23-Mar-2012 1840 2012-03-23 2128 03/13/12 0219 03/13/12 0544  BP: 147/76 137/82 137/85 139/89  Pulse: 99 58 80 57  Temp: 98.6 F (37 C) 97.9 F (36.6 C) 98.1 F (36.7 C) 98.2 F (36.8 C)  TempSrc: Oral Oral    Resp: 20 19 18 18   Height:      Weight:      SpO2: 97% 99% 99% 98%    Intake/Output Summary (Last 24 hours) at 03/13/12 1053 Last data filed at 03/13/12 0537  Gross per 24 hour  Intake 2541.67 ml  Output      0 ml  Net 2541.67 ml   Filed Weights   03/11/12 0452  Weight: 155.5 kg (342 lb 13 oz)    Exam: Physical Exam: General: Awake, Oriented, No acute distress. HEENT: EOMI. Neck: Supple CV: S1 and S2 Lungs: Clear to ascultation bilaterally Abdomen: Soft, Nontender, Nondistended, +bowel sounds. Ext: Good pulses. Trace edema.  Data Reviewed: Basic Metabolic Panel:  Recent Labs Lab 03/11/12 0056 03/11/12 1047 23-Mar-2012 0515 03/13/12 0520  NA 141 142 138 140  K 3.8 3.5 3.2* 3.6  CL 105 108 103 108  CO2 25 26 25 26   GLUCOSE 123* 112* 73 85  BUN 9 8 8 6   CREATININE 0.78 0.90 0.83 0.80   CALCIUM 10.4 9.7 9.5 9.6   Liver Function Tests:  Recent Labs Lab 03/11/12 0056 03/11/12 1047 03/23/2012 0515 03/13/12 0520  AST 731* 551* 178* 54*  ALT 573* 583* 421* 241*  ALKPHOS 212* 213* 208* 171*  BILITOT 2.7* 3.2* 0.8 0.4  PROT 7.9 6.7 6.7 6.3  ALBUMIN 3.9 3.3* 3.2* 3.0*    Recent Labs Lab 03/11/12 0056 03/11/12 1047 03-23-12 0515  LIPASE 1151* >3000* 928*   CBC:  Recent Labs Lab 03/11/12 0056 03/11/12 1047 March 23, 2012 0515 03/13/12 0520  WBC 8.1 5.6 6.5 6.5  NEUTROABS 5.7 3.5  --   --   HGB 14.7 13.3 13.0 12.6  HCT 41.9 39.3 39.9 37.7  MCV 76.2* 77.2* 77.6* 77.6*  PLT 288 248 238 223   Studies: Mr 3d Recon At Scanner  03-23-12  , *RADIOLOGY REPORT*  Clinical Data:  Evaluate gallstone pancreatitis.  MRI ABDOMEN WITHOUT AND WITH CONTRAST (INCLUDING MRCP)  Technique:  Multiplanar multisequence MR imaging of the abdomen was performed both before and after the administration of intravenous contrast. Heavily T2-weighted images of the biliary and pancreatic ducts were obtained, and three-dimensional MRCP images were rendered by post processing.  Contrast: 20mL MULTIHANCE GADOBENATE DIMEGLUMINE 529 MG/ML IV SOLN  Comparison:  None.  Findings:  There is no pericardial or pleural effusion.  No suspicious liver abnormality.  There is mild diffuse fatty infiltration of the liver.  No suspicious liver abnormalities identified.  Multiple small  stones are within the lumen of the gall bladder. The common bile duct has a normal caliber measuring 6.3 mm.  There is no significant pancreatic ductal dilatation.  There is diffuse peripancreatic edema.  However, there is uniform enhancement of the pancreas following contrast administration.  No evidence for pancreatic necrosis.  No pancreatic fluid collections or peripancreatic fluid collections to suggest pseudocyst.  The portal vein is patent.  The splenic vein is also patent.  Normal appearance of both kidneys.  No significant free fluid  or fluid collections within the abdomen or pelvis.  No adenopathy identified.  IMPRESSION:  1.  Pancreatitis. 2.  Gallstones. 3.  Normal caliber and course of the common bile duct and pancreatic duct without evidence for choledocholithiasis.   Original Report Authenticated By: Signa Kell, M.D.    Mr Abd W/wo Cm/mrcp  03/12/2012  , *RADIOLOGY REPORT*  Clinical Data:  Evaluate gallstone pancreatitis.  MRI ABDOMEN WITHOUT AND WITH CONTRAST (INCLUDING MRCP)  Technique:  Multiplanar multisequence MR imaging of the abdomen was performed both before and after the administration of intravenous contrast. Heavily T2-weighted images of the biliary and pancreatic ducts were obtained, and three-dimensional MRCP images were rendered by post processing.  Contrast: 20mL MULTIHANCE GADOBENATE DIMEGLUMINE 529 MG/ML IV SOLN  Comparison:  None.  Findings:  There is no pericardial or pleural effusion.  No suspicious liver abnormality.  There is mild diffuse fatty infiltration of the liver.  No suspicious liver abnormalities identified.  Multiple small stones are within the lumen of the gall bladder. The common bile duct has a normal caliber measuring 6.3 mm.  There is no significant pancreatic ductal dilatation.  There is diffuse peripancreatic edema.  However, there is uniform enhancement of the pancreas following contrast administration.  No evidence for pancreatic necrosis.  No pancreatic fluid collections or peripancreatic fluid collections to suggest pseudocyst.  The portal vein is patent.  The splenic vein is also patent.  Normal appearance of both kidneys.  No significant free fluid or fluid collections within the abdomen or pelvis.  No adenopathy identified.  IMPRESSION:  1.  Pancreatitis. 2.  Gallstones. 3.  Normal caliber and course of the common bile duct and pancreatic duct without evidence for choledocholithiasis.   Original Report Authenticated By: Signa Kell, M.D.     Scheduled Meds: . enoxaparin (LOVENOX)  injection  60 mg Subcutaneous Q24H   Continuous Infusions: . sodium chloride 100 mL/hr at 03/13/12 0121    Principal Problem:   Gallstone pancreatitis Active Problems:   Abdominal pain, acute   Nausea & vomiting   Postpartum state  [name] A  [facility] Pager [phone]. If 7 PM - 7 AM, please contact night-coverage at [email or web address], password [facility] 03/13/2012, 10:53 AM  LOS: 2 days

## 2012-03-13 NOTE — Progress Notes (Addendum)
Discharge instructions gone over with patient. Home medications gone over. Follow up appointment to be made. Diet, activity, and incisional care gone over. Signs and symptoms of worsening condition gone over and when to call the doctor. Prescription given to patient. My chart discussed. Patient verbalized understanding of orders. Note to return to work given to patient.

## 2012-03-13 NOTE — Discharge Instructions (Addendum)
[facility] ______[facility] °LAPAROSCOPIC SURGERY: POST OP INSTRUCTIONS °Always review your discharge instruction sheet given to you by the facility where your surgery was performed. °IF YOU HAVE DISABILITY OR FAMILY LEAVE FORMS, YOU MUST BRING THEM TO THE OFFICE FOR PROCESSING.   °DO NOT GIVE THEM TO YOUR DOCTOR. ° °1. A prescription for pain medication may be given to you upon discharge.  Take your pain medication as prescribed, if needed.  If narcotic pain medicine is not needed, then you may take acetaminophen (Tylenol) or ibuprofen (Advil) as needed. °2. Take your usually prescribed medications unless otherwise directed. °3. If you need a refill on your pain medication, please contact your pharmacy.  They will contact our office to request authorization. Prescriptions will not be filled after 5pm or on week-ends. °4. You should follow a light diet the first few days after arrival home, such as soup and crackers, etc.  Be sure to include lots of fluids daily. °5. Most patients will experience some swelling and bruising in the area of the incisions.  Ice packs will help.  Swelling and bruising can take several days to resolve.  °6. It is common to experience some constipation if taking pain medication after surgery.  Increasing fluid intake and taking a stool softener (such as Colace) will usually help or prevent this problem from occurring.  A mild laxative (Milk of Magnesia or Miralax) should be taken according to package instructions if there are no bowel movements after 48 hours. °7. Unless discharge instructions indicate otherwise, you may remove your bandages 24-48 hours after surgery, and you may shower at that time.  You may have steri-strips (small skin tapes) in place directly over the incision.  These strips should be left on the skin for 7-10 days.  If your surgeon used skin glue on the incision, you may shower in 24 hours.  The glue will flake off over the next 2-3 weeks.  Any sutures or  staples will be removed at the office during your follow-up visit. °8. ACTIVITIES:  You may resume regular (light) daily activities beginning the next day--such as daily self-care, walking, climbing stairs--gradually increasing activities as tolerated.  You may have sexual intercourse when it is comfortable.  Refrain from any heavy lifting or straining until approved by your doctor. °a. You may drive when you are no longer taking prescription pain medication, you can comfortably wear a seatbelt, and you can safely maneuver your car and apply brakes. °b. RETURN TO WORK:  __________________________________________________________ °9. You should see your doctor in the office for a follow-up appointment approximately 2-3 weeks after your surgery.  Make sure that you call for this appointment within a day or two after you arrive home to insure a convenient appointment time. °10. OTHER INSTRUCTIONS: __________________________________________________________________________________________________________________________ __________________________________________________________________________________________________________________________ °WHEN TO CALL YOUR DOCTOR: °1. Fever over 101.0 °2. Inability to urinate °3. Continued bleeding from incision. °4. Increased pain, redness, or drainage from the incision. °5. Increasing abdominal pain ° °The clinic staff is available to answer your questions during regular business hours.  Please don’t hesitate to call and ask to speak to one of the nurses for clinical concerns.  If you have a medical emergency, go to the nearest emergency room or call 911.  A surgeon from [facility] is always on call at the hospital. °[address], [address], [place], [place]  [address] ? [address] [address], [place],    [address] °[phone] ? [phone] ? FAX [phone] °Web site:   [email or web address]  Acute Pancreatitis Acute pancreatitis is a disease in  which the pancreas becomes suddenly inflamed. The pancreas is a large gland located behind your stomach. The pancreas produces enzymes that help digest food. The pancreas also releases the hormones glucagon and insulin that help regulate blood sugar. Damage to the pancreas occurs when the digestive enzymes from the pancreas are activated and begin attacking the pancreas before being released into the intestine. Most acute attacks last a couple of days and can cause serious complications. Some people become dehydrated and develop low blood pressure. In severe cases, bleeding into the pancreas can lead to shock and can be life-threatening. The lungs, heart, and kidneys may fail. CAUSES  Pancreatitis can happen to anyone. In some cases, the cause is unknown. Most cases are caused by:  Alcohol abuse.  Gallstones. Other less common causes are:  Certain medicines.  Exposure to certain chemicals.  Infection.  Damage caused by an accident (trauma).  Abdominal surgery. SYMPTOMS   Pain in the upper abdomen that may radiate to the back.  Tenderness and swelling of the abdomen.  Nausea and vomiting. DIAGNOSIS  Your caregiver will perform a physical exam. Blood and stool tests may be done to confirm the diagnosis. Imaging tests may also be done, such as X-rays, CT scans, or an ultrasound of the abdomen. TREATMENT  Treatment usually requires a stay in the hospital. Treatment may include:  Pain medicine.  Fluid replacement through an intravenous line (IV).  Placing a tube in the stomach to remove stomach contents and control vomiting.  Not eating for 3 or 4 days. This gives your pancreas a rest, because enzymes are not being produced that can cause further damage.  Antibiotic medicines if your condition is caused by an infection.  Surgery of the pancreas or gallbladder. HOME CARE INSTRUCTIONS   Follow the diet advised by your caregiver. This may involve avoiding alcohol and decreasing the  amount of fat in your diet.  Eat smaller, more frequent meals. This reduces the amount of digestive juices the pancreas produces.  Drink enough fluids to keep your urine clear or pale yellow.  Only take over-the-counter or prescription medicines as directed by your caregiver.  Avoid drinking alcohol if it caused your condition.  Do not smoke.  Get plenty of rest.  Check your blood sugar at home as directed by your caregiver.  Keep all follow-up appointments as directed by your caregiver. SEEK MEDICAL CARE IF:   You do not recover as quickly as expected.  You develop new or worsening symptoms.  You have persistent pain, weakness, or nausea.  You recover and then have another episode of pain. SEEK IMMEDIATE MEDICAL CARE IF:   You are unable to eat or keep fluids down.  Your pain becomes severe.  You have a fever or persistent symptoms for more than 2 to 3 days.  You have a fever and your symptoms suddenly get worse.  Your skin or the white part of your eyes turn yellow (jaundice).  You develop vomiting.  You feel dizzy, or you faint.  Your blood sugar is high (over 300 mg/dL). MAKE SURE YOU:   Understand these instructions.  Will watch your condition.  Will get help right away if you are not doing well or get worse. Document Released: 01/09/2005 Document Revised: 07/11/2011 Document Reviewed: 04/20/2011 [facility] Patient Information 2013 [place], [place].

## 2012-03-13 NOTE — Discharge Summary (Signed)
Physician Discharge Summary  Whitney [name] ZOX:[id number] DOB: 03/02/1985 DOA: 03/11/2012  PCP: Default, Provider, MD  Admit date: 03/11/2012 Discharge date: 03/13/2012  Time spent: 25 minutes  Recommendations for Outpatient Follow-up:  1. Followup with General surgery as already arranged.   Discharge Diagnoses:  Principal Problem:   Gallstone pancreatitis Active Problems:   Abdominal pain, acute   Nausea & vomiting   Postpartum state   Discharge Condition: Stable  Diet recommendation: Heart healthy diet.  Filed Weights   03/11/12 0452  Weight: 155.5 kg (342 lb 13 oz)    History of present illness:  On admission: "27 yo healthy aaf who is 6 weeks postpartum svd of healthy female who suffered from biliary colic during her pregnancy that normally lasted only an hour or so then would resolve. However today, she started having epigastric abd pain/ruq abd pain with n/v that has persisted for more than 12 hours. No fevers. No blood in vomit. No diarrhea. She feels better with ivf and nausea/pain meds in ED. She is not breastfeeding."  Hospital Course:  Acute pancreatitis due to gallstones MRCP is negative for CBD dilation, positive for gallstones. LFTs continues to improve. Laparoscopic cholecystectomy on 03/12/2012, could not do a cholangiogram during surgery however MRCP was negative. Patient has a outpatient general surgery followup already arranged.  6 weeks postpartum Patient reports that she is not breast feeding. Currently the child is on formula.  Morbid obesity Diet and exercise as outpatient.  Consultants:  Gastroenterology  Surgery   Discharge Exam: Filed Vitals:   03/12/12 1840 03/12/12 2128 03/13/12 0219 03/13/12 0544  BP: 147/76 137/82 137/85 139/89  Pulse: 99 58 80 57  Temp: 98.6 F (37 C) 97.9 F (36.6 C) 98.1 F (36.7 C) 98.2 F (36.8 C)  TempSrc: Oral Oral    Resp: 20 19 18 18   Height:      Weight:      SpO2: 97% 99% 99% 98%    Discharge  Instructions  Discharge Orders   Future Appointments Provider Department Dept Phone   03/26/2012 12:30 PM [facility] [place] [phone]   Future Orders Complete By Expires     Diet - low sodium heart healthy  As directed     Discharge instructions  As directed     Comments:      Please followup with General surgery as already arranged.    Increase activity slowly  As directed         Medication List    TAKE these medications       ondansetron 4 MG tablet  Commonly known as:  ZOFRAN  Take 1 tablet (4 mg total) by mouth every 6 (six) hours as needed for nausea.     oxyCODONE 5 MG/5ML solution  Commonly known as:  ROXICODONE  Take 5 mLs (5 mg total) by mouth every 4 (four) hours as needed for pain.     prenatal multivitamin Tabs  Take 1 tablet by mouth daily.           Follow-up Information   Follow up with [facility] [place] On 03/26/2012. (March 4th at 12:30pm, arrive at 12:00pm for check in)    Contact information:   [address]   [place] [address] [phone]        The results of significant diagnostics from this hospitalization (including imaging, microbiology, ancillary and laboratory) are listed below for reference.  Significant Diagnostic Studies: Mr 3d Recon At Scanner  Apr 01, 2012  , *RADIOLOGY REPORT*  Clinical Data:  Evaluate gallstone pancreatitis.  MRI ABDOMEN WITHOUT AND WITH CONTRAST (INCLUDING MRCP)  Technique:  Multiplanar multisequence MR imaging of the abdomen was performed both before and after the administration of intravenous contrast. Heavily T2-weighted images of the biliary and pancreatic ducts were obtained, and three-dimensional MRCP images were rendered by post processing.  Contrast: 20mL MULTIHANCE GADOBENATE DIMEGLUMINE 529 MG/ML IV SOLN  Comparison:  None.  Findings:  There is no pericardial or pleural effusion.  No suspicious liver abnormality.  There is mild diffuse fatty  infiltration of the liver.  No suspicious liver abnormalities identified.  Multiple small stones are within the lumen of the gall bladder. The common bile duct has a normal caliber measuring 6.3 mm.  There is no significant pancreatic ductal dilatation.  There is diffuse peripancreatic edema.  However, there is uniform enhancement of the pancreas following contrast administration.  No evidence for pancreatic necrosis.  No pancreatic fluid collections or peripancreatic fluid collections to suggest pseudocyst.  The portal vein is patent.  The splenic vein is also patent.  Normal appearance of both kidneys.  No significant free fluid or fluid collections within the abdomen or pelvis.  No adenopathy identified.  IMPRESSION:  1.  Pancreatitis. 2.  Gallstones. 3.  Normal caliber and course of the common bile duct and pancreatic duct without evidence for choledocholithiasis.   Original Report Authenticated By: Signa Kell, M.D.    Mr Abd W/wo Cm/mrcp  04/01/2012  , *RADIOLOGY REPORT*  Clinical Data:  Evaluate gallstone pancreatitis.  MRI ABDOMEN WITHOUT AND WITH CONTRAST (INCLUDING MRCP)  Technique:  Multiplanar multisequence MR imaging of the abdomen was performed both before and after the administration of intravenous contrast. Heavily T2-weighted images of the biliary and pancreatic ducts were obtained, and three-dimensional MRCP images were rendered by post processing.  Contrast: 20mL MULTIHANCE GADOBENATE DIMEGLUMINE 529 MG/ML IV SOLN  Comparison:  None.  Findings:  There is no pericardial or pleural effusion.  No suspicious liver abnormality.  There is mild diffuse fatty infiltration of the liver.  No suspicious liver abnormalities identified.  Multiple small stones are within the lumen of the gall bladder. The common bile duct has a normal caliber measuring 6.3 mm.  There is no significant pancreatic ductal dilatation.  There is diffuse peripancreatic edema.  However, there is uniform enhancement of the  pancreas following contrast administration.  No evidence for pancreatic necrosis.  No pancreatic fluid collections or peripancreatic fluid collections to suggest pseudocyst.  The portal vein is patent.  The splenic vein is also patent.  Normal appearance of both kidneys.  No significant free fluid or fluid collections within the abdomen or pelvis.  No adenopathy identified.  IMPRESSION:  1.  Pancreatitis. 2.  Gallstones. 3.  Normal caliber and course of the common bile duct and pancreatic duct without evidence for choledocholithiasis.   Original Report Authenticated By: Signa Kell, M.D.     Microbiology: Recent Results (from the past 240 hour(s))  URINE CULTURE     Status: None   Collection Time    03/11/12  1:26 AM      Result Value Range Status   Specimen Description URINE, CLEAN CATCH   Final   Special Requests ADDED 0202   Final   Culture  Setup Time 03/11/2012 02:07   Final   Colony Count 6,000 COLONIES/ML   Final   Culture INSIGNIFICANT GROWTH   Final  Report Status 03/12/2012 FINAL   Final  SURGICAL PCR SCREEN     Status: None   Collection Time    03/12/12  8:40 AM      Result Value Range Status   MRSA, PCR NEGATIVE  NEGATIVE Final   Staphylococcus aureus NEGATIVE  NEGATIVE Final   Comment:            The Xpert SA Assay (FDA     approved for NASAL specimens     in patients over 24 years of age),     is one component of     a comprehensive surveillance     program.  Test performance has     been validated by [employer] for patients greater     than or equal to 39 year old.     It is not intended     to diagnose infection nor to     guide or monitor treatment.     Labs: Basic Metabolic Panel:  Recent Labs Lab 03/11/12 0056 03/11/12 1047 03/12/12 0515 03/13/12 0520  NA 141 142 138 140  K 3.8 3.5 3.2* 3.6  CL 105 108 103 108  CO2 25 26 25 26   GLUCOSE 123* 112* 73 85  BUN 9 8 8 6   CREATININE 0.78 0.90 0.83 0.80  CALCIUM 10.4 9.7 9.5 9.6   Liver  Function Tests:  Recent Labs Lab 03/11/12 0056 03/11/12 1047 03/12/12 0515 03/13/12 0520  AST 731* 551* 178* 54*  ALT 573* 583* 421* 241*  ALKPHOS 212* 213* 208* 171*  BILITOT 2.7* 3.2* 0.8 0.4  PROT 7.9 6.7 6.7 6.3  ALBUMIN 3.9 3.3* 3.2* 3.0*    Recent Labs Lab 03/11/12 0056 03/11/12 1047 03/12/12 0515  LIPASE 1151* >3000* 928*   No results found for this basename: AMMONIA,  in the last 168 hours CBC:  Recent Labs Lab 03/11/12 0056 03/11/12 1047 03/12/12 0515 03/13/12 0520  WBC 8.1 5.6 6.5 6.5  NEUTROABS 5.7 3.5  --   --   HGB 14.7 13.3 13.0 12.6  HCT 41.9 39.3 39.9 37.7  MCV 76.2* 77.2* 77.6* 77.6*  PLT 288 248 238 223   Cardiac Enzymes: No results found for this basename: CKTOTAL, CKMB, CKMBINDEX, TROPONINI,  in the last 168 hours BNP: BNP (last 3 results) No results found for this basename: PROBNP,  in the last 8760 hours CBG: No results found for this basename: GLUCAP,  in the last 168 hours     Signed:  Azlaan Isidore A  [facility] 03/13/2012, 11:01 AM

## 2012-03-15 ENCOUNTER — Encounter (HOSPITAL_COMMUNITY): Payer: Self-pay | Admitting: Surgery

## 2012-03-26 ENCOUNTER — Encounter (INDEPENDENT_AMBULATORY_CARE_PROVIDER_SITE_OTHER): Payer: BC Managed Care – PPO

## 2012-04-09 ENCOUNTER — Ambulatory Visit (INDEPENDENT_AMBULATORY_CARE_PROVIDER_SITE_OTHER): Payer: BC Managed Care – PPO | Admitting: General Surgery

## 2012-04-09 ENCOUNTER — Encounter (INDEPENDENT_AMBULATORY_CARE_PROVIDER_SITE_OTHER): Payer: Self-pay

## 2012-04-09 VITALS — BP 150/84 | HR 78 | Temp 97.3°F | Resp 18 | Ht 71.0 in | Wt 322.4 lb

## 2012-04-09 DIAGNOSIS — Z9049 Acquired absence of other specified parts of digestive tract: Secondary | ICD-10-CM

## 2012-04-09 DIAGNOSIS — K859 Acute pancreatitis without necrosis or infection, unspecified: Secondary | ICD-10-CM

## 2012-04-09 DIAGNOSIS — K8010 Calculus of gallbladder with chronic cholecystitis without obstruction: Secondary | ICD-10-CM

## 2012-04-09 DIAGNOSIS — K851 Biliary acute pancreatitis without necrosis or infection: Secondary | ICD-10-CM

## 2012-04-09 DIAGNOSIS — Z9889 Other specified postprocedural states: Secondary | ICD-10-CM

## 2012-04-09 NOTE — Patient Instructions (Addendum)
She may resume a regular diet and full activity.  She may follow-up on a PRN basis. 

## 2012-04-09 NOTE — Progress Notes (Addendum)
  Subjective: Whitney [name] is a 27 y.o. female who had a laparoscopic cholecystectomy  on 03/12/12 by Dr. Magnus Ivan  returns to the clinic today.  Pathology reveals Chronic cholecystitis, cholelithiasis, and cholesterolisis.  The patient is tolerating their diet well and is having no severe pain.  Bowel function is good.  The pre-operative symptoms of abdominal pain, nausea, and vomiting have resolved.  No problems with the wounds.  Pt is returning to normal activity / work.   Objective: Vital signs in last 24 hours: Reviewed   PE: General:  Alert, NAD, pleasant Abdomen:  soft, NT/ND, +bs, incisions appear well-healed with no sign of infection or bleeding   Assessment/Plan  1.  S/P Laparoscopic Cholecystectomy: doing well, may resume regular activity without restrictions, Pt will follow up with [place] PRN and knows to call with questions or concerns.    2.  Hypertension (2 elevated systolic BPs) in office -second reading was 148/82  Advised to make an appt with her PCP regarding her high BP's and may need screening for diabetes and  hyperlipidemia.  Aris [place], PA-C 04/09/2012

## 2013-11-24 ENCOUNTER — Encounter (INDEPENDENT_AMBULATORY_CARE_PROVIDER_SITE_OTHER): Payer: Self-pay

## 2014-05-28 ENCOUNTER — Encounter (HOSPITAL_COMMUNITY): Payer: Self-pay | Admitting: Emergency Medicine

## 2014-05-28 ENCOUNTER — Emergency Department (HOSPITAL_COMMUNITY)
Admission: EM | Admit: 2014-05-28 | Discharge: 2014-05-28 | Disposition: A | Discharge status: Home / Self Care | Payer: BLUE CROSS/BLUE SHIELD | Source: Home / Self Care | Attending: Family Medicine | Admitting: Family Medicine

## 2014-05-28 DIAGNOSIS — J040 Acute laryngitis: Secondary | ICD-10-CM

## 2014-05-28 DIAGNOSIS — J039 Acute tonsillitis, unspecified: Secondary | ICD-10-CM

## 2014-05-28 LAB — POCT RAPID STREP A: Streptococcus, Group A Screen (Direct): NEGATIVE

## 2014-05-28 MED ORDER — AMOXICILLIN 400 MG/5ML PO SUSR: 500.0000 mg | Freq: Two times a day (BID) | ORAL | Status: DC

## 2014-05-28 MED ORDER — PREDNISOLONE SODIUM PHOSPHATE 15 MG/5ML PO SOLN
60.0000 mg | Freq: Once | ORAL | Status: AC
  Administered 2014-05-28: 60 mg via ORAL

## 2014-05-28 MED ORDER — PREDNISOLONE 15 MG/5ML PO SOLN
ORAL | Status: AC
  Filled 2014-05-28: qty 4

## 2014-05-28 NOTE — ED Notes (Signed)
C/o  Sore throat.  Fever.  Body aches.  Headache.  Chills.  And pain with swallowing since Monday.   Denies n/v/d.   Mild relief with mothers pain meds.  States mother recently dx with strep.

## 2014-05-28 NOTE — Discharge Instructions (Signed)
Your infection may be caused by strep but may also be due to a virus. Please start the antibiotics taken for the full 10 days. Please use ibuprofen for additional pain and inflammation relief. You were given a dose of steroids to help with the inflammation, and hoarse voice.

## 2014-05-28 NOTE — ED Provider Notes (Signed)
CSN: [id number]     Arrival date & time 05/28/14  1023 History   First MD Initiated Contact with Patient 05/28/14 1207     Chief Complaint  Patient presents with  . Sore Throat   (Consider location/radiation/quality/duration/timing/severity/associated sxs/prior Treatment) HPI  4 days of body aches. 2 days of chills and sore throat. Getting worse. Symptoms are constant. Very little PO during this time. Associate with fevers. Denies chest pain, shortness breath, palpitations, cough, runny nose, sinus congestion, ear pain, headache, abdominal pain, dysuria, frequency, rash, back pain, constipation, diarrhea, vomiting.   Past Medical History  Diagnosis Date  . No pertinent past medical history   . Headache(784.0)   . Gallstones    Past Surgical History  Procedure Laterality Date  . No past surgeries    . Cholecystectomy N/A 03/12/2012    Procedure: LAPAROSCOPIC CHOLECYSTECTOMY;  Surgeon: Harl Bowie, MD;  Location: [facility] OR;  Service: General;  Laterality: N/A;   Family History  Problem Relation Age of Onset  . Anesthesia problems Neg Hx   . Hypotension Neg Hx   . Malignant hyperthermia Neg Hx   . Pseudochol deficiency Neg Hx   . Diabetes Maternal Grandmother   . Cancer Maternal Grandmother     Breast   History  Substance Use Topics  . Smoking status: Former Smoker    Types: Cigarettes    Quit date: 05/24/2011  . Smokeless tobacco: Never Used  . Alcohol Use: Yes     Comment: Rarely.   OB History    Gravida Para Term Preterm AB TAB SAB Ectopic Multiple Living   1 1 1  0 0 0 0 0 0 1     Review of Systems Per HPI with all other pertinent systems negative.   Allergies  Review of patient's allergies indicates no known allergies.  Home Medications   Prior to Admission medications   Medication Sig Start Date End Date Taking? Authorizing Provider  amoxicillin (AMOXIL) 400 MG/5ML suspension Take 6.3 mLs (500 mg total) by mouth 2 (two) times daily. 05/28/14   Waldemar Dickens, MD  Prenatal Vit-Fe Fumarate-FA (PRENATAL MULTIVITAMIN) TABS Take 1 tablet by mouth daily.    Historical Provider, MD  UNABLE TO FIND Please excuse Ms. Grullon from any work obligations from 03/11/2012 till 03/17/2012. Return to work on 03/18/2012 without any restrictions. 03/13/12   Bynum Bellows, MD   BP 138/92 mmHg  Pulse 78  Temp(Src) 97.9 F (36.6 C) (Oral)  Resp 20  SpO2 98%  LMP 04/28/2014 Physical Exam Physical Exam  Constitutional: oriented to person, place, and time. appears well-developed and well-nourished. No distress.  HENT:  Anterior cervical lymphadenopathy, tonsils 2+ with exudate, hoarse voice Head: Normocephalic and atraumatic.  Eyes: EOMI. PERRL.  Neck: Normal range of motion.  Cardiovascular: RRR, no m/r/g, 2+ distal pulses,  Pulmonary/Chest: Effort normal and breath sounds normal. No respiratory distress.  Abdominal: Soft. Bowel sounds are normal. NonTTP, no distension.  Musculoskeletal: Normal range of motion. Non ttp, no effusion.  Neurological: alert and oriented to person, place, and time.  Skin: Skin is warm. No rash noted. non diaphoretic.  Psychiatric: normal mood and affect. behavior is normal. Judgment and thought content normal.   ED Course  Procedures (including critical care time) Labs Review Labs Reviewed  CULTURE, GROUP A STREP  POCT RAPID STREP A ([place])    Imaging Review No results found.   MDM   1. Laryngitis   2. Tonsillitis  Orapred 60 mg by mouth given in clinic  Start amoxicillin due to significant tonsillitis and laryngitis. Rapid strep negative but will send strep culture. Ibuprofen, Tylenol, fluids, rest, work note provided.  Waldemar Dickens, MD 05/28/14 [phone]

## 2014-05-30 LAB — CULTURE, GROUP A STREP: STREP A CULTURE: NEGATIVE

## 2014-05-31 NOTE — ED Notes (Signed)
Final report strep negative , no further action required

## 2015-12-27 ENCOUNTER — Ambulatory Visit (INDEPENDENT_AMBULATORY_CARE_PROVIDER_SITE_OTHER): Payer: BLUE CROSS/BLUE SHIELD | Admitting: Family

## 2015-12-27 ENCOUNTER — Encounter: Payer: Self-pay | Admitting: Family

## 2015-12-27 ENCOUNTER — Other Ambulatory Visit (INDEPENDENT_AMBULATORY_CARE_PROVIDER_SITE_OTHER): Payer: BLUE CROSS/BLUE SHIELD

## 2015-12-27 DIAGNOSIS — Z0000 Encounter for general adult medical examination without abnormal findings: Secondary | ICD-10-CM

## 2015-12-27 LAB — COMPREHENSIVE METABOLIC PANEL
ALT: 34 U/L (ref 0–35)
AST: 18 U/L (ref 0–37)
Albumin: 3.7 g/dL (ref 3.5–5.2)
Alkaline Phosphatase: 56 U/L (ref 39–117)
BUN: 8 mg/dL (ref 6–23)
CALCIUM: 10.1 mg/dL (ref 8.4–10.5)
CHLORIDE: 106 meq/L (ref 96–112)
CO2: 28 meq/L (ref 19–32)
Creatinine, Ser: 0.83 mg/dL (ref 0.40–1.20)
GFR: 102.95 mL/min (ref >60.00)
GLUCOSE: 199 mg/dL — AB (ref 70–99)
POTASSIUM: 4 meq/L (ref 3.5–5.1)
Sodium: 139 mEq/L (ref 135–145)
Total Bilirubin: 0.4 mg/dL (ref 0.2–1.2)
Total Protein: 7.1 g/dL (ref 6.0–8.3)

## 2015-12-27 LAB — CBC
HCT: 41.1 % (ref 36.0–46.0)
HEMOGLOBIN: 13.9 g/dL (ref 12.0–15.0)
MCHC: 33.8 g/dL (ref 30.0–36.0)
MCV: 81.9 fl (ref 78.0–100.0)
Platelets: 293 10*3/uL (ref 150.0–400.0)
RBC: 5.02 Mil/uL (ref 3.87–5.11)
RDW: 13.4 % (ref 11.5–15.5)
WBC: 7.8 10*3/uL (ref 4.0–10.5)

## 2015-12-27 LAB — LIPID PANEL
CHOL/HDL RATIO: 3
Cholesterol: 147 mg/dL (ref 0–200)
HDL: 44 mg/dL (ref >39.00)
LDL CALC: 90 mg/dL (ref 0–99)
NonHDL: 102.73
TRIGLYCERIDES: 66 mg/dL (ref 0.0–149.0)
VLDL: 13.2 mg/dL (ref 0.0–40.0)

## 2015-12-27 LAB — TSH: TSH: 1.35 u[IU]/mL (ref 0.35–4.50)

## 2015-12-27 LAB — VITAMIN D 25 HYDROXY (VIT D DEFICIENCY, FRACTURES): VITD: 17.42 ng/mL — ABNORMAL LOW (ref 30.00–100.00)

## 2015-12-27 NOTE — Assessment & Plan Note (Addendum)
1) Anticipatory Guidance: Discussed importance of wearing a seatbelt while driving and not texting while driving; changing batteries in smoke detector at least once annually; wearing suntan lotion when outside; eating a balanced and moderate diet; getting physical activity at least 30 minutes per day.  2) Immunizations / Screenings / Labs:  Declines influenza. All other immunizations are up-to-date per recommendations. Due for a dental screen encouraged to be completed independently. Obtain vitamin D for vitamin D deficiency screening. All other screenings are up-to-date per recommendations. Obtain CBC, CMET, and lipid profile.    Overall well exam with risk factors for cardiovascular disease including morbid obesity and sedentary lifestyle. She does consume a significant amount of energy drinks. Recommend weight loss through calorie counting and 5-10% of current body weight by increasing physical activity and adjusting nutritional intake. Counseled regarding nutrition and weight loss. She is a single parent and works full-time. Continue other healthy lifestyle behaviors and choices. Follow-up prevention exam in 1 year. Follow-up office visit pending blood work.

## 2015-12-27 NOTE — Assessment & Plan Note (Signed)
BMI of 51.47. Recommend weight loss of 5-10% of current body weight. Recommend increasing physical activity to 30 minutes of moderate level activity daily. Encourage nutritional intake that focuses on nutrient dense foods and is moderate, varied, and balanced and is low in saturated fats and processed/sugary foods. Continue to monitor.

## 2015-12-27 NOTE — Patient Instructions (Addendum)
Thank you for choosing [facility].  SUMMARY AND INSTRUCTIONS:  Recommend MyFitnessPal to track calories.  Recommend increasing physical activity to 30 minutes of moderate level activity daily.  Labs:  Please stop by the lab on the lower level of the building for your blood work. Your results will be released to MyChart (or called to you) after review, usually within 72 hours after test completion. If any changes need to be made, you will be notified at that same time.  1.) The lab is open from 7:30am to 5:30 pm Monday-Friday 2.) No appointment is necessary 3.) Fasting (if needed) is 6-8 hours after food and drink; black coffee and water are okay    Follow up:  If your symptoms worsen or fail to improve, please contact our office for further instruction, or in case of emergency go directly to the emergency room at the closest medical facility.    Health Maintenance, Female Introduction Adopting a healthy lifestyle and getting preventive care can go a long way to promote health and wellness. Talk with your health care provider about what schedule of regular examinations is right for you. This is a good chance for you to check in with your provider about disease prevention and staying healthy. In between checkups, there are plenty of things you can do on your own. Experts have done a lot of research about which lifestyle changes and preventive measures are most likely to keep you healthy. Ask your health care provider for more information. Weight and diet Eat a healthy diet  Be sure to include plenty of vegetables, fruits, low-fat dairy products, and lean protein.  Do not eat a lot of foods high in solid fats, added sugars, or salt.  Get regular exercise. This is one of the most important things you can do for your health.  Most adults should exercise for at least 150 minutes each week. The exercise should increase your heart rate and make you sweat (moderate-intensity  exercise).  Most adults should also do strengthening exercises at least twice a week. This is in addition to the moderate-intensity exercise. Maintain a healthy weight  Body mass index (BMI) is a measurement that can be used to identify possible weight problems. It estimates body fat based on height and weight. Your health care provider can help determine your BMI and help you achieve or maintain a healthy weight.  For females 78 years of age and older:  A BMI below 18.5 is considered underweight.  A BMI of 18.5 to 24.9 is normal.  A BMI of 25 to 29.9 is considered overweight.  A BMI of 30 and above is considered obese. Watch levels of cholesterol and blood lipids  You should start having your blood tested for lipids and cholesterol at 31 years of age, then have this test every 5 years.  You may need to have your cholesterol levels checked more often if:  Your lipid or cholesterol levels are high.  You are older than 31 years of age.  You are at high risk for heart disease. Cancer screening Lung Cancer  Lung cancer screening is recommended for adults 53-11 years old who are at high risk for lung cancer because of a history of smoking.  A yearly low-dose CT scan of the lungs is recommended for people who:  Currently smoke.  Have quit within the past 15 years.  Have at least a 30-pack-year history of smoking. A pack year is smoking an average of one pack of cigarettes a  day for 1 year.  Yearly screening should continue until it has been 15 years since you quit.  Yearly screening should stop if you develop a health problem that would prevent you from having lung cancer treatment. Breast Cancer  Practice breast self-awareness. This means understanding how your breasts normally appear and feel.  It also means doing regular breast self-exams. Let your health care provider know about any changes, no matter how small.  If you are in your 20s or 30s, you should have a clinical  breast exam (CBE) by a health care provider every 1-3 years as part of a regular health exam.  If you are 76 or older, have a CBE every year. Also consider having a breast X-ray (mammogram) every year.  If you have a family history of breast cancer, talk to your health care provider about genetic screening.  If you are at high risk for breast cancer, talk to your health care provider about having an MRI and a mammogram every year.  Breast cancer gene (BRCA) assessment is recommended for women who have family members with BRCA-related cancers. BRCA-related cancers include:  Breast.  Ovarian.  Tubal.  Peritoneal cancers.  Results of the assessment will determine the need for genetic counseling and BRCA1 and BRCA2 testing. Cervical Cancer  Your health care provider may recommend that you be screened regularly for cancer of the pelvic organs (ovaries, uterus, and vagina). This screening involves a pelvic examination, including checking for microscopic changes to the surface of your cervix (Pap test). You may be encouraged to have this screening done every 3 years, beginning at age 35.  For women ages 63-65, health care providers may recommend pelvic exams and Pap testing every 3 years, or they may recommend the Pap and pelvic exam, combined with testing for human papilloma virus (HPV), every 5 years. Some types of HPV increase your risk of cervical cancer. Testing for HPV may also be done on women of any age with unclear Pap test results.  Other health care providers may not recommend any screening for nonpregnant women who are considered low risk for pelvic cancer and who do not have symptoms. Ask your health care provider if a screening pelvic exam is right for you.  If you have had past treatment for cervical cancer or a condition that could lead to cancer, you need Pap tests and screening for cancer for at least 20 years after your treatment. If Pap tests have been discontinued, your risk  factors (such as having a new sexual partner) need to be reassessed to determine if screening should resume. Some women have medical problems that increase the chance of getting cervical cancer. In these cases, your health care provider may recommend more frequent screening and Pap tests. Colorectal Cancer  This type of cancer can be detected and often prevented.  Routine colorectal cancer screening usually begins at 31 years of age and continues through 31 years of age.  Your health care provider may recommend screening at an earlier age if you have risk factors for colon cancer.  Your health care provider may also recommend using home test kits to check for hidden blood in the stool.  A small camera at the end of a tube can be used to examine your colon directly (sigmoidoscopy or colonoscopy). This is done to check for the earliest forms of colorectal cancer.  Routine screening usually begins at age 11.  Direct examination of the colon should be repeated every 5-10 years through 31  years of age. However, you may need to be screened more often if early forms of precancerous polyps or small growths are found. Skin Cancer  Check your skin from head to toe regularly.  Tell your health care provider about any new moles or changes in moles, especially if there is a change in a mole's shape or color.  Also tell your health care provider if you have a mole that is larger than the size of a pencil eraser.  Always use sunscreen. Apply sunscreen liberally and repeatedly throughout the day.  Protect yourself by wearing long sleeves, pants, a wide-brimmed hat, and sunglasses whenever you are outside. Heart disease, diabetes, and high blood pressure  High blood pressure causes heart disease and increases the risk of stroke. High blood pressure is more likely to develop in:  People who have blood pressure in the high end of the normal range (130-139/85-89 mm Hg).  People who are overweight or  obese.  People who are African American.  If you are 60-58 years of age, have your blood pressure checked every 3-5 years. If you are 35 years of age or older, have your blood pressure checked every year. You should have your blood pressure measured twice-once when you are at a hospital or clinic, and once when you are not at a hospital or clinic. Record the average of the two measurements. To check your blood pressure when you are not at a hospital or clinic, you can use:  An automated blood pressure machine at a pharmacy.  A home blood pressure monitor.  If you are between 83 years and 54 years old, ask your health care provider if you should take aspirin to prevent strokes.  Have regular diabetes screenings. This involves taking a blood sample to check your fasting blood sugar level.  If you are at a normal weight and have a low risk for diabetes, have this test once every three years after 31 years of age.  If you are overweight and have a high risk for diabetes, consider being tested at a younger age or more often. Preventing infection Hepatitis B  If you have a higher risk for hepatitis B, you should be screened for this virus. You are considered at high risk for hepatitis B if:  You were born in a country where hepatitis B is common. Ask your health care provider which countries are considered high risk.  Your parents were born in a high-risk country, and you have not been immunized against hepatitis B (hepatitis B vaccine).  You have HIV or AIDS.  You use needles to inject street drugs.  You live with someone who has hepatitis B.  You have had sex with someone who has hepatitis B.  You get hemodialysis treatment.  You take certain medicines for conditions, including cancer, organ transplantation, and autoimmune conditions. Hepatitis C  Blood testing is recommended for:  Everyone born from [birth year] through 1966.  Anyone with known risk factors for hepatitis C. Sexually  transmitted infections (STIs)  You should be screened for sexually transmitted infections (STIs) including gonorrhea and chlamydia if:  You are sexually active and are younger than 31 years of age.  You are older than 31 years of age and your health care provider tells you that you are at risk for this type of infection.  Your sexual activity has changed since you were last screened and you are at an increased risk for chlamydia or gonorrhea. Ask your health care provider if you  are at risk.  If you do not have HIV, but are at risk, it may be recommended that you take a prescription medicine daily to prevent HIV infection. This is called pre-exposure prophylaxis (PrEP). You are considered at risk if:  You are sexually active and do not regularly use condoms or know the HIV status of your partner(s).  You take drugs by injection.  You are sexually active with a partner who has HIV. Talk with your health care provider about whether you are at high risk of being infected with HIV. If you choose to begin PrEP, you should first be tested for HIV. You should then be tested every 3 months for as long as you are taking PrEP. Pregnancy  If you are premenopausal and you may become pregnant, ask your health care provider about preconception counseling.  If you may become pregnant, take 400 to 800 micrograms (mcg) of folic acid every day.  If you want to prevent pregnancy, talk to your health care provider about birth control (contraception). Osteoporosis and menopause  Osteoporosis is a disease in which the bones lose minerals and strength with aging. This can result in serious bone fractures. Your risk for osteoporosis can be identified using a bone density scan.  If you are 22 years of age or older, or if you are at risk for osteoporosis and fractures, ask your health care provider if you should be screened.  Ask your health care provider whether you should take a calcium or vitamin D  supplement to lower your risk for osteoporosis.  Menopause may have certain physical symptoms and risks.  Hormone replacement therapy may reduce some of these symptoms and risks. Talk to your health care provider about whether hormone replacement therapy is right for you. Follow these instructions at home:  Schedule regular health, dental, and eye exams.  Stay current with your immunizations.  Do not use any tobacco products including cigarettes, chewing tobacco, or electronic cigarettes.  If you are pregnant, do not drink alcohol.  If you are breastfeeding, limit how much and how often you drink alcohol.  Limit alcohol intake to no more than 1 drink per day for nonpregnant women. One drink equals 12 ounces of beer, 5 ounces of wine, or 1 ounces of hard liquor.  Do not use street drugs.  Do not share needles.  Ask your health care provider for help if you need support or information about quitting drugs.  Tell your health care provider if you often feel depressed.  Tell your health care provider if you have ever been abused or do not feel safe at home. This information is not intended to replace advice given to you by your health care provider. Make sure you discuss any questions you have with your health care provider. Document Released: 07/25/2010 Document Revised: 06/17/2015 Document Reviewed: 10/13/2014  2017 [employer]vier

## 2015-12-27 NOTE — Progress Notes (Signed)
Subjective:    Patient ID: Whitney [name], female    DOB: 1985-01-31, 30 y.o.   MRN: [id number]  Chief Complaint  Patient presents with  . Establish Care    CPE, fasting    HPI:  Whitney [name] is a 30 y.o. female who presents today for an annual wellness visit.   1) Health Maintenance -   Diet - Averages about 2-3 meals per day consisting of a regular diet; Caffeine intake of [employer] and Energy drinks  Exercise - No structured exercise   2) Preventative Exams / Immunizations:  Dental -- Due for exam  Vision -- Up to date   Health Maintenance  Topic Date Due  . INFLUENZA VACCINE  04/22/2016 (Originally 08/24/2015)  . PAP SMEAR  10/25/2017  . TETANUS/TDAP  01/26/2022  . HIV Screening  Completed    Immunization History  Administered Date(s) Administered  . Tdap 01/27/2012     No Known Allergies   Outpatient Medications Prior to Visit  Medication Sig Dispense Refill  . Prenatal Vit-Fe Fumarate-FA (PRENATAL MULTIVITAMIN) TABS Take 1 tablet by mouth daily.    Marland Kitchen UNABLE TO FIND Please excuse Ms. Kolk from any work obligations from 03/11/2012 till 03/17/2012. Return to work on 03/18/2012 without any restrictions. 1 Units 0  . amoxicillin (AMOXIL) 400 MG/5ML suspension Take 6.3 mLs (500 mg total) by mouth 2 (two) times daily. 130 mL 0   No facility-administered medications prior to visit.      Past Medical History:  Diagnosis Date  . Gallstones   . Headache(784.0)   . No pertinent past medical history      Past Surgical History:  Procedure Laterality Date  . CHOLECYSTECTOMY N/A 03/12/2012   Procedure: LAPAROSCOPIC CHOLECYSTECTOMY;  Surgeon: Harl Bowie, MD;  Location: [facility] OR;  Service: General;  Laterality: N/A;     Family History  Problem Relation Age of Onset  . Healthy Mother   . Ulcerative colitis Father   . Diabetes Maternal Grandmother   . Breast cancer Maternal Grandmother   . Syncope episode Maternal Grandfather   . Benign prostatic  hyperplasia Maternal Grandfather   . Brain cancer Paternal Grandmother   . Lung cancer Paternal Grandfather   . Anesthesia problems Neg Hx   . Hypotension Neg Hx   . Malignant hyperthermia Neg Hx   . Pseudochol deficiency Neg Hx      Social History   Social History  . Marital status: Single    Spouse name: N/A  . Number of children: 1  . Years of education: 37   Occupational History  . Quality Manager    Social History Main Topics  . Smoking status: Former Smoker    Types: Cigarettes    Quit date: 05/24/2011  . Smokeless tobacco: Never Used  . Alcohol use Yes     Comment: Once a month on average  . Drug use: No  . Sexual activity: Yes    Birth control/ protection: None   Other Topics Concern  . Not on file   Social History Narrative   Fun: Read, hang out with her son.    Denies abuse and feels safe at home.       Review of Systems  Constitutional: Denies fever, chills, fatigue, or significant weight gain/loss. HENT: Head: Denies headache or neck pain Ears: Denies changes in hearing, ringing in ears, earache, drainage Nose: Denies discharge, stuffiness, itching, nosebleed, sinus pain Throat: Denies sore throat, hoarseness, dry mouth, sores, thrush Eyes:  Denies loss/changes in vision, pain, redness, blurry/double vision, flashing lights Cardiovascular: Denies chest pain/discomfort, tightness, palpitations, shortness of breath with activity, difficulty lying down, swelling, sudden awakening with shortness of breath Respiratory: Denies shortness of breath, cough, sputum production, wheezing Gastrointestinal: Denies dysphasia, heartburn, change in appetite, nausea, change in bowel habits, rectal bleeding, constipation, diarrhea, yellow skin or eyes Genitourinary: Denies frequency, urgency, burning/pain, blood in urine, incontinence, change in urinary strength. Musculoskeletal: Denies muscle/joint pain, stiffness, back pain, redness or swelling of joints, trauma Skin:  Denies rashes, lumps, itching, dryness, color changes, or hair/nail changes Neurological: Denies dizziness, fainting, seizures, weakness, numbness, tingling, tremor Psychiatric - Denies nervousness, stress, depression or memory loss Endocrine: Denies heat or cold intolerance, sweating, frequent urination, excessive thirst, changes in appetite Hematologic: Denies ease of bruising or bleeding     Objective:     BP (!) 138/92 (BP Location: Left Arm, Patient Position: Sitting, Cuff Size: Large)   Pulse 73   Temp 99 F (37.2 C) (Oral)   Resp 16   Ht 5\' 11"  (1.803 m)   Wt (!) 369 lb (167.4 kg)   SpO2 97%   BMI 51.47 kg/m  Nursing note and vital signs reviewed.  Physical Exam  Constitutional: She is oriented to person, place, and time. She appears well-developed and well-nourished.  HENT:  Head: Normocephalic.  Right Ear: Hearing, tympanic membrane, external ear and ear canal normal.  Left Ear: Hearing, tympanic membrane, external ear and ear canal normal.  Nose: Nose normal.  Mouth/Throat: Uvula is midline, oropharynx is clear and moist and mucous membranes are normal.  Eyes: Conjunctivae and EOM are normal. Pupils are equal, round, and reactive to light.  Neck: Neck supple. No JVD present. No tracheal deviation present. No thyromegaly present.  Cardiovascular: Normal rate, regular rhythm, normal heart sounds and intact distal pulses.   Pulmonary/Chest: Effort normal and breath sounds normal.  Abdominal: Soft. Bowel sounds are normal. She exhibits no distension and no mass. There is no tenderness. There is no rebound and no guarding.  Musculoskeletal: Normal range of motion. She exhibits no edema or tenderness.  Lymphadenopathy:    She has no cervical adenopathy.  Neurological: She is alert and oriented to person, place, and time. She has normal reflexes. No cranial nerve deficit. She exhibits normal muscle tone. Coordination normal.  Skin: Skin is warm and dry.  Psychiatric: She  has a normal mood and affect. Her behavior is normal. Judgment and thought content normal.       Assessment & Plan:   Problem List Items Addressed This Visit      Other   Routine general medical examination at a health care facility    1) Anticipatory Guidance: Discussed importance of wearing a seatbelt while driving and not texting while driving; changing batteries in smoke detector at least once annually; wearing suntan lotion when outside; eating a balanced and moderate diet; getting physical activity at least 30 minutes per day.  2) Immunizations / Screenings / Labs:  Declines influenza. All other immunizations are up-to-date per recommendations. Due for a dental screen encouraged to be completed independently. Obtain vitamin D for vitamin D deficiency screening. All other screenings are up-to-date per recommendations. Obtain CBC, CMET, and lipid profile.    Overall well exam with risk factors for cardiovascular disease including morbid obesity and sedentary lifestyle. She does consume a significant amount of energy drinks. Recommend weight loss through calorie counting and 5-10% of current body weight by increasing physical activity and adjusting nutritional intake.  Counseled regarding nutrition and weight loss. She is a single parent and works full-time. Continue other healthy lifestyle behaviors and choices. Follow-up prevention exam in 1 year. Follow-up office visit pending blood work.       Relevant Orders   CBC (Completed)   Comprehensive metabolic panel (Completed)   Lipid panel (Completed)   TSH (Completed)   VITAMIN D 25 Hydroxy (Vit-D Deficiency, Fractures) (Completed)   Morbid obesity (HCC)    BMI of 51.47. Recommend weight loss of 5-10% of current body weight. Recommend increasing physical activity to 30 minutes of moderate level activity daily. Encourage nutritional intake that focuses on nutrient dense foods and is moderate, varied, and balanced and is low in saturated  fats and processed/sugary foods. Continue to monitor.            I have discontinued Ms. Luu's UNABLE TO FIND and amoxicillin. I am also having her maintain her prenatal multivitamin.   Follow-up: Return in about 1 month (around 01/27/2016), or if symptoms worsen or fail to improve.   Mauricio Po, FNP

## 2015-12-28 ENCOUNTER — Other Ambulatory Visit: Payer: Self-pay | Admitting: Family

## 2015-12-28 DIAGNOSIS — R7301 Impaired fasting glucose: Secondary | ICD-10-CM

## 2015-12-28 DIAGNOSIS — E559 Vitamin D deficiency, unspecified: Secondary | ICD-10-CM

## 2015-12-28 MED ORDER — VITAMIN D3 1.25 MG (50000 UT) PO TABS: 50000.0000 [IU] | ORAL_TABLET | ORAL | 0 refills | Status: DC

## 2016-01-14 ENCOUNTER — Other Ambulatory Visit (INDEPENDENT_AMBULATORY_CARE_PROVIDER_SITE_OTHER): Payer: BLUE CROSS/BLUE SHIELD

## 2016-01-14 DIAGNOSIS — R7301 Impaired fasting glucose: Secondary | ICD-10-CM

## 2016-01-14 LAB — HEMOGLOBIN A1C: HEMOGLOBIN A1C: 7.6 % — AB (ref 4.6–6.5)

## 2016-01-17 ENCOUNTER — Other Ambulatory Visit: Payer: Self-pay | Admitting: Family

## 2016-01-17 DIAGNOSIS — E119 Type 2 diabetes mellitus without complications: Secondary | ICD-10-CM | POA: Insufficient documentation

## 2016-01-17 MED ORDER — METFORMIN HCL ER 500 MG PO TB24: 500.0000 mg | ORAL_TABLET | Freq: Every day | ORAL | 0 refills | Status: DC

## 2016-01-17 NOTE — Assessment & Plan Note (Signed)
Recently completed physical exam and was noted to have elevated fasting blood sugar with confirmation A1c of 7.6. Started on metformin and will follow up for diabetic prevention.

## 2016-01-18 ENCOUNTER — Other Ambulatory Visit: Payer: Self-pay | Admitting: Family

## 2016-01-18 MED ORDER — DULAGLUTIDE 0.75 MG/0.5ML ~~LOC~~ SOAJ: 0.7500 mg | SUBCUTANEOUS | 2 refills | Status: DC

## 2016-01-28 ENCOUNTER — Ambulatory Visit (INDEPENDENT_AMBULATORY_CARE_PROVIDER_SITE_OTHER): Payer: BLUE CROSS/BLUE SHIELD | Admitting: Family

## 2016-01-28 ENCOUNTER — Encounter: Payer: Self-pay | Admitting: Family

## 2016-01-28 VITALS — BP 142/92 | HR 83 | Temp 98.7°F | Resp 18 | Ht 71.0 in | Wt 363.0 lb

## 2016-01-28 DIAGNOSIS — Z23 Encounter for immunization: Secondary | ICD-10-CM | POA: Diagnosis not present

## 2016-01-28 DIAGNOSIS — E119 Type 2 diabetes mellitus without complications: Secondary | ICD-10-CM | POA: Diagnosis not present

## 2016-01-28 MED ORDER — ONETOUCH ULTRA MINI W/DEVICE KIT: PACK | 0 refills | Status: DC

## 2016-01-28 MED ORDER — GLUCOSE BLOOD VI STRP
ORAL_STRIP | 12 refills | Status: DC
Start: 2016-01-28 — End: 2018-04-15

## 2016-01-28 MED ORDER — ONETOUCH SURESOFT LANCING DEV MISC: 1 refills | Status: DC

## 2016-01-28 NOTE — Assessment & Plan Note (Addendum)
Newly diagnosed with Type 2 diabetes. Pneumovax updated today. Refer to diabetes education. Educated regarding proper administration of Trulicity with patient return demonstration. Diabetic foot exam completed today. Blood monitoring supplies sent to pharmacy with patient instructed to check blood sugars daily. Continue current dosage of Trulicity. Obtain urine microalbumin with next A1c. Follow up in 2 months or sooner if needed.

## 2016-01-28 NOTE — Progress Notes (Signed)
Subjective:    Patient ID: Whitney [name], female    DOB: Mar 11, 1985, 31 y.o.   MRN: [id number]  Chief Complaint  Patient presents with  . Diabetes follow up    diabetes prevention, wants to talk about the trulicity injections    HPI:  Whitney [name] is a 31 y.o. female who  has a past medical history of Gallstones; Headache(784.0); and No pertinent past medical history. and presents today for an a follow up office visit.   1.) Type 2 diabetes -Newly diagnosed with type 2 diabetes and started on Trulicity. Reports attempting to take the medication, however has had some difficulties. Due for diabetic prevention. Working on decreasing the carbohydrate and sugary/processed foods. Would like referral to diabetes education.    Lab Results  Component Value Date   HGBA1C 7.6 (H) 01/14/2016     No Known Allergies    Outpatient Medications Prior to Visit  Medication Sig Dispense Refill  . Cholecalciferol (VITAMIN D3) 50000 units TABS Take 50,000 Units by mouth once a week. 8 tablet 0  . Dulaglutide (TRULICITY) 0.04 HT/9.7FS SOPN Inject 0.75 mg into the skin once a week. 4 pen 2  . Prenatal Vit-Fe Fumarate-FA (PRENATAL MULTIVITAMIN) TABS Take 1 tablet by mouth daily.     No facility-administered medications prior to visit.     Review of Systems  Eyes:       Denies changes in vision  Respiratory: Negative for chest tightness and shortness of breath.   Cardiovascular: Negative for chest pain, palpitations and leg swelling.  Endocrine: Negative for polydipsia, polyphagia and polyuria.  Neurological: Negative for dizziness, weakness and numbness.      Objective:    BP (!) 142/92 (BP Location: Left Arm, Patient Position: Sitting, Cuff Size: Large)   Pulse 83   Temp 98.7 F (37.1 C) (Oral)   Resp 18   Ht '5\' 11"'  (1.803 m)   Wt (!) 363 lb (164.7 kg)   SpO2 98%   BMI 50.63 kg/m  Nursing note and vital signs reviewed.  Physical Exam  Constitutional: She is oriented to person,  place, and time. She appears well-developed and well-nourished. No distress.  Cardiovascular: Normal rate, regular rhythm, normal heart sounds and intact distal pulses.   Pulmonary/Chest: Effort normal and breath sounds normal.  Neurological: She is alert and oriented to person, place, and time.  Diabetic foot exam - bilateral feet are free from skin breakdown, cuts, and abrasions. Toenails are neatly trimmed. Pulses are intact and appropriate. Sensation is intact to monofilament bilaterally.  Skin: Skin is warm and dry.  Psychiatric: She has a normal mood and affect. Her behavior is normal. Judgment and thought content normal.       Assessment & Plan:   Problem List Items Addressed This Visit      Endocrine   Type 2 diabetes mellitus ([place]) - Primary    Newly diagnosed with Type 2 diabetes. Pneumovax updated today. Refer to diabetes education. Educated regarding proper administration of Trulicity with patient return demonstration. Diabetic foot exam completed today. Blood monitoring supplies sent to pharmacy with patient instructed to check blood sugars daily. Continue current dosage of Trulicity. Obtain urine microalbumin with next A1c. Follow up in 2 months or sooner if needed.       Relevant Orders   Ambulatory referral to diabetic education    Other Visit Diagnoses    Need for 23-polyvalent pneumococcal polysaccharide vaccine       Relevant Orders   Pneumococcal  polysaccharide vaccine 23-valent greater than or equal to 2yo subcutaneous/IM (Completed)       I am having Ms. Vides start on ONE TOUCH SURESOFT, glucose blood, and ONE TOUCH ULTRA MINI. I am also having her maintain her prenatal multivitamin, Vitamin D3, and Dulaglutide.   Meds ordered this encounter  Medications  . Lancets Misc. (ONE TOUCH SURESOFT) MISC    Sig: Use 1 lancet per test. Test blood sugars 1-4 times per day as instructed.    Dispense:  1 each    Refill:  1    Substitution permissible per insurance  coverage. Dx E11.9.    Order Specific Question:   Supervising Provider    Answer:   Pricilla Holm A [[id number]]  . glucose blood (ONE TOUCH ULTRA TEST) test strip    Sig: Use one strip per test. Test blood sugars 1-4 times daily as instructed.    Dispense:  100 each    Refill:  12    Substitution permissible per insurance coverage. Dx E11.9.    Order Specific Question:   Supervising Provider    Answer:   Pricilla Holm A [[id number]]  . Blood Glucose Monitoring Suppl (ONE TOUCH ULTRA MINI) w/Device KIT    Sig: Use meter to check blood sugars 1-4 times daily as instructed.    Dispense:  1 each    Refill:  0    Substitution permissible per insurance coverage. Dx E11.9.    Order Specific Question:   Supervising Provider    Answer:   Pricilla Holm A [[id number]]     Follow-up: Return in about 2 months (around 03/27/2016), or if symptoms worsen or fail to improve.  Mauricio Po, FNP

## 2016-01-28 NOTE — Patient Instructions (Addendum)
Thank you for choosing [facility].  SUMMARY AND INSTRUCTIONS:  Medication:  Please continue to take your medication as prescribed.   Your prescription(s) have been submitted to your pharmacy or been printed and provided for you. Please take as directed and contact our office if you believe you are having problem(s) with the medication(s) or have any questions.  Follow up:  If your symptoms worsen or fail to improve, please contact our office for further instruction, or in case of emergency go directly to the emergency room at the closest medical facility.    DIABETES CARE INSTRUCTIONS:  Current A1c:  Lab Results  Component Value Date   HGBA1C 7.6 (H) 01/14/2016    A1C Goal: <7.0%  Fasting Blood Sugar Goal: 80-130 Blood sugar check that you take after fasting for at least 8 hours which is usually in the morning before eating.  Post-prandial Blood Sugar Goal: <180 Blood sugar check approximately 1-2 hours after the start of a meal.   Diabetes Prevention Screens:  1.) Ensure you have an annual eye exam by an ophthalmologist or optometrist.   2,) Ensure you have an annual foot exam or when any changes are noted including new onset numbness/tingling or wounds. Check your feet daily!  3.) The American Diabetes Association recommends the Pneumovax vaccination against pneumonia every 5 years.  4.) We will check your kidney function with a urine test on an annual basis.

## 2016-01-31 ENCOUNTER — Other Ambulatory Visit: Payer: Self-pay

## 2016-01-31 MED ORDER — ONETOUCH SURESOFT LANCING DEV MISC: 1 refills | Status: DC

## 2016-02-22 ENCOUNTER — Other Ambulatory Visit: Payer: Self-pay

## 2016-02-22 MED ORDER — DULAGLUTIDE 0.75 MG/0.5ML ~~LOC~~ SOAJ: 0.7500 mg | SUBCUTANEOUS | 2 refills | Status: DC

## 2016-02-25 ENCOUNTER — Encounter: Payer: BLUE CROSS/BLUE SHIELD | Attending: Family | Admitting: Dietician

## 2016-02-25 ENCOUNTER — Encounter: Payer: Self-pay | Admitting: Dietician

## 2016-02-25 DIAGNOSIS — E119 Type 2 diabetes mellitus without complications: Secondary | ICD-10-CM | POA: Diagnosis not present

## 2016-02-25 DIAGNOSIS — Z713 Dietary counseling and surveillance: Secondary | ICD-10-CM | POA: Diagnosis not present

## 2016-02-25 NOTE — Patient Instructions (Signed)
Start a vitamin D supplement.  Find one that is sublingual or liquid. Stay as active as possible.  Look for ways to incorporate exercise throughout the day. Continue to be mindful about your food choices.  Eat slowly.  Stop when you are satisfied. Consider eating away from TV, computer and other electronics.  Aim for 2-3 Carb Choices per meal (30-45 grams) +/- 1 either way  Aim for 0-1 Carbs per snack if hungry  Include protein in moderation with your meals and snacks Consider reading food labels for Total Carbohydrate and Fat Grams of foods Consider  increasing your activity level by walking for 30 minutes daily as tolerated Consider checking BG at alternate times per day as directed by MD  Consider taking medication as directed by MD

## 2016-02-25 NOTE — Progress Notes (Signed)
Diabetes Self-Management Education  Visit Type: First/Initial  Appt. Start Time: 1415  Appt. End Time: [id number]  02/25/2016  [name], identified by name and date of birth, is a 31 y.o. female with a diagnosis of Diabetes: Type 2. Other hx includes gallstones and vitamin D deficiency.  Vitamin D 17.4 12/27/15.  Patient was not taking the vitamin D supplement because she cannot swallow pills.  A1C 7.6%.  She is currently taking Trulicity.  She has not started using the blood glucose monitor as she does not know how to use this.  Patient lives with her 79 yo son.  She works as a Cabin crew for call centers.  ASSESSMENT  Height 6' (1.829 m), weight (!) 365 lb (165.6 kg). Body mass index is 49.5 kg/m.  Her weight was 350 lbs in 2016 and lost 80 lbs.  She changed jobs in 2017.  Time spent at work and stress increased and she was no longer able to exercise.  She gained weight back to current weight.  She lost weight initially with exercise and doing "green smoothie cleans" for 10 days followed by a modified cleans.  The full cleans entailed 8 ounces of smoothie that included kale, spinach, peanut butter, banana, berries, stevia, and water 3 times per day and snacks such as tuna, boiled egg, or [place] cookie tid.  The modified fast was 2 shakes and 1 healthy lunch, and 3 snacks.      Diabetes Self-Management Education - 02/25/16 1433      Visit Information   Visit Type First/Initial     Initial Visit   Diabetes Type Type 2   Are you currently following a meal plan? Yes   What type of meal plan do you follow? decreased sugar and carbohydrates   Are you taking your medications as prescribed? Yes   Date Diagnosed 12/18     Health Coping   How would you rate your overall health? Fair     Psychosocial Assessment   Patient Belief/Attitude about Diabetes Motivated to manage diabetes   Self-care barriers None   Self-management support Doctor's office   Other persons present Patient    Patient Concerns Nutrition/Meal planning;Glycemic Control;Weight Control;Monitoring   Special Needs None   Preferred Learning Style No preference indicated   Learning Readiness Ready   How often do you need to have someone help you when you read instructions, pamphlets, or other written materials from your doctor or pharmacy? 1 - Never   What is the last grade level you completed in school? 2 1/2 years college     Pre-Education Assessment   Patient understands the diabetes disease and treatment process. Needs Instruction   Patient understands incorporating nutritional management into lifestyle. Needs Instruction   Patient undertands incorporating physical activity into lifestyle. Needs Instruction   Patient understands using medications safely. Needs Instruction   Patient understands monitoring blood glucose, interpreting and using results Needs Instruction   Patient understands prevention, detection, and treatment of acute complications. Needs Instruction   Patient understands prevention, detection, and treatment of chronic complications. Needs Instruction   Patient understands how to develop strategies to address psychosocial issues. Needs Instruction   Patient understands how to develop strategies to promote health/change behavior. Needs Instruction     Complications   Last HgB A1C per patient/outside source 7.6 %  01/13/17   How often do you check your blood sugar? 0 times/day (not testing)   Have you had a dilated eye exam in the past  12 months? Yes   Have you had a dental exam in the past 12 months? Yes   Are you checking your feet? Yes   How many days per week are you checking your feet? 7     Dietary Intake   Breakfast skip OR sausage biscuit OR poptart   Snack (morning) none   Lunch avoids fast foods but out to eat ([place] or [place])   Snack (afternoon) none since diabetes, prior would go to the vending machine   [employer]) OR cooks salmon or chicken,  mashed potatoes, green vege or salad OR cabbage stew OR salad with grilled chicken   Snack (evening) occasional graham crackers OR crunch N munch OR yogurt with granola   Beverage(s) water, 2% milk,      Exercise   Exercise Type ADL's  gym membership but unable to have time   How many days per week to you exercise? 0   How many minutes per day do you exercise? 0   Total minutes per week of exercise 0     Patient Education   Previous Diabetes Education No   Disease state  Definition of diabetes, type 1 and 2, and the diagnosis of diabetes   Nutrition management  Role of diet in the treatment of diabetes and the relationship between the three main macronutrients and blood glucose level;Food label reading, portion sizes and measuring food.;Carbohydrate counting;Information on hints to eating out and maintain blood glucose control.;Meal options for control of blood glucose level and chronic complications.   Physical activity and exercise  Role of exercise on diabetes management, blood pressure control and cardiac health.;Other (comment)  Hope to incorporate it with limited time   Medications Reviewed patients medication for diabetes, action, purpose, timing of dose and side effects.   Monitoring Taught/evaluated SMBG meter.;Purpose and frequency of SMBG.;Identified appropriate SMBG and/or A1C goals.;Daily foot exams;Yearly dilated eye exam   Acute complications Taught treatment of hypoglycemia - the 15 rule.   Chronic complications Relationship between chronic complications and blood glucose control;Dental care;Retinopathy and reason for yearly dilated eye exams   Psychosocial adjustment Worked with patient to identify barriers to care and solutions;Role of stress on diabetes;Identified and addressed patients feelings and concerns about diabetes     Individualized Goals (developed by patient)   Nutrition General guidelines for healthy choices and portions discussed   Physical Activity Exercise  5-7 days per week;30 minutes per day   Medications take my medication as prescribed   Monitoring  test my blood glucose as discussed   Problem Solving fitting exercise into week   Reducing Risk examine blood glucose patterns;do foot checks daily;treat hypoglycemia with 15 grams of carbs if blood glucose less than 70mg /dL   Health Coping ask for help with (comment)  MD/RD     Post-Education Assessment   Patient understands the diabetes disease and treatment process. Demonstrates understanding / competency   Patient understands incorporating nutritional management into lifestyle. Demonstrates understanding / competency   Patient undertands incorporating physical activity into lifestyle. Demonstrates understanding / competency   Patient understands using medications safely. Demonstrates understanding / competency   Patient understands monitoring blood glucose, interpreting and using results Demonstrates understanding / competency   Patient understands prevention, detection, and treatment of acute complications. Demonstrates understanding / competency   Patient understands prevention, detection, and treatment of chronic complications. Demonstrates understanding / competency   Patient understands how to develop strategies to address psychosocial issues. Demonstrates understanding / competency   Patient  understands how to develop strategies to promote health/change behavior. Demonstrates understanding / competency     Outcomes   Expected Outcomes Demonstrated interest in learning. Expect positive outcomes   Future DMSE PRN   Program Status Completed      Individualized Plan for Diabetes Self-Management Training:   Learning Objective:  Patient will have a greater understanding of diabetes self-management. Patient education plan is to attend individual and/or group sessions per assessed needs and concerns.   Plan:   Patient Instructions  Start a vitamin D supplement.  Find one that is  sublingual or liquid. Stay as active as possible.  Look for ways to incorporate exercise throughout the day. Continue to be mindful about your food choices.  Eat slowly.  Stop when you are satisfied. Consider eating away from TV, computer and other electronics.  Aim for 2-3 Carb Choices per meal (30-45 grams) +/- 1 either way  Aim for 0-1 Carbs per snack if hungry  Include protein in moderation with your meals and snacks Consider reading food labels for Total Carbohydrate and Fat Grams of foods Consider  increasing your activity level by walking for 30 minutes daily as tolerated Consider checking BG at alternate times per day as directed by MD  Consider taking medication as directed by MD      Expected Outcomes:  Demonstrated interest in learning. Expect positive outcomes  Education material provided: Living Well with Diabetes, Food label handouts, A1C conversion sheet, Meal plan card, My Plate, Snack sheet and Support group flyer, fast food and diabetes, dining out and diabetes  If problems or questions, patient to contact team via:  Phone and Email  Future DSME appointment: PRN

## 2016-03-01 ENCOUNTER — Encounter: Payer: Self-pay | Admitting: Family

## 2016-03-01 ENCOUNTER — Telehealth: Payer: Self-pay

## 2016-03-01 ENCOUNTER — Ambulatory Visit (INDEPENDENT_AMBULATORY_CARE_PROVIDER_SITE_OTHER): Payer: BLUE CROSS/BLUE SHIELD | Admitting: Family

## 2016-03-01 VITALS — BP 136/94 | HR 83 | Temp 98.5°F | Resp 16 | Ht 72.0 in | Wt 366.0 lb

## 2016-03-01 DIAGNOSIS — R202 Paresthesia of skin: Secondary | ICD-10-CM | POA: Diagnosis not present

## 2016-03-01 DIAGNOSIS — R200 Anesthesia of skin: Secondary | ICD-10-CM | POA: Diagnosis not present

## 2016-03-01 NOTE — Telephone Encounter (Signed)
[facility]

## 2016-03-01 NOTE — Progress Notes (Signed)
Subjective:    Patient ID: Whitney [name], female    DOB: Apr 11, 1985, 31 y.o.   MRN: [id number]  Chief Complaint  Patient presents with  . Numbness    had pain and tingling in left hand, had the same issue in the back of right leg    HPI:  Whitney [name] is a 31 y.o. female who  has a past medical history of Diabetes mellitus without complication ([place]); Gallstones; Headache(784.0); and No pertinent past medical history. and presents today for an office visit.  This is a new problem. Associated symptom of pain and numbness located in her left hand has been going on for about 2 weeks. No trauma or injury that she can recall. Described as a sudden onset numbness with occasional pain that was located between her fingertips to her wrist. Course has improved since initial onset with no symptoms for about 1 week. Does do a lot of typing but does not feel like carpal tunnel which she has had before. Right hand dominant. No weakness. No elbow, shoulder or neck pain. Similar symptoms had occurred in the posterior aspect of her right leg which resolved independently. There were no modifying factors or attempted treatments. No aggravating factors.   Lab Results  Component Value Date   HGBA1C 7.6 (H) 01/14/2016      No Known Allergies    Outpatient Medications Prior to Visit  Medication Sig Dispense Refill  . Biotin 1 MG CAPS Take by mouth.    . Blood Glucose Monitoring Suppl (ONE TOUCH ULTRA MINI) w/Device KIT Use meter to check blood sugars 1-4 times daily as instructed. 1 each 0  . Cholecalciferol (VITAMIN D3) 50000 units TABS Take 50,000 Units by mouth once a week. 8 tablet 0  . Dulaglutide (TRULICITY) 0.37 CW/8.8QB SOPN Inject 0.75 mg into the skin once a week. 4 pen 2  . glucose blood (ONE TOUCH ULTRA TEST) test strip Use one strip per test. Test blood sugars 1-4 times daily as instructed. 100 each 12  . Lancets Misc. (ONE TOUCH SURESOFT) MISC Use 1 lancet per test. Test blood sugars 1-4 times  per day as instructed. 1 each 1  . Prenatal Vit-Fe Fumarate-FA (PRENATAL MULTIVITAMIN) TABS Take 1 tablet by mouth daily.     No facility-administered medications prior to visit.       Past Surgical History:  Procedure Laterality Date  . CHOLECYSTECTOMY N/A 03/12/2012   Procedure: LAPAROSCOPIC CHOLECYSTECTOMY;  Surgeon: Harl Bowie, MD;  Location: [facility];  Service: General;  Laterality: N/A;      Past Medical History:  Diagnosis Date  . Diabetes mellitus without complication ([place])   . Gallstones   . Headache(784.0)   . No pertinent past medical history      Review of Systems  Constitutional: Negative for chills and fever.  Musculoskeletal:       Positive for left wrist / hand pain.  Neurological: Positive for numbness. Negative for weakness.      Objective:    BP (!) 136/94 (BP Location: Left Arm, Patient Position: Sitting, Cuff Size: Large)   Pulse 83   Temp 98.5 F (36.9 C) (Oral)   Resp 16   Ht 6' (1.829 m)   Wt (!) 366 lb (166 kg)   SpO2 98%   BMI 49.64 kg/m  Nursing note and vital signs reviewed.  Physical Exam  Constitutional: She is oriented to person, place, and time. She appears well-developed and well-nourished. No distress.  Cardiovascular: Normal  rate, regular rhythm, normal heart sounds and intact distal pulses.   Pulmonary/Chest: Effort normal and breath sounds normal.  Musculoskeletal:  Left hand/wrist - No obvious deformity, discoloration, or edema of the left hand/wrist. No tenderness, crepitus or deformity. Increased symptoms with palpation of the ulnar nerve and active flexion of the neck. No palpable neck tenderness. Wrist/hand and neck range of motion are normal. Distal pulses and sensation are intact and appropriate. Negative Phalens; Negative Finklesteins.   Neurological: She is alert and oriented to person, place, and time.  Skin: Skin is warm and dry.  Psychiatric: She has a normal mood and affect. Her behavior is normal. Judgment  and thought content normal.       Assessment & Plan:   Problem List Items Addressed This Visit      Other   Numbness and tingling of hand - Primary    Numbness and tingling of the left hand improved since initial onset with no significant trauma most likely related to postural imbalances and neck stiffness and tightness. Start ice/moist heat and home exercise therapy for the neck. Follow-up if symptoms return or worsen for possible referral for possible nerve conduction study if needed.          I am having Ms. Schweppe maintain her prenatal multivitamin, Vitamin D3, glucose blood, ONE TOUCH ULTRA MINI, ONE TOUCH SURESOFT, Dulaglutide, and Biotin.   Follow-up: Return if symptoms worsen or fail to improve.  Mauricio Po, FNP

## 2016-03-01 NOTE — Patient Instructions (Signed)
Thank you for choosing [facility].  SUMMARY AND INSTRUCTIONS:  Work on good posture to help.  Ice / moist heat to the neck as needed.  Stretching exercises throughout the the day.  Ergonomic assessment at work.   Follow up:  If your symptoms worsen or fail to improve, please contact our office for further instruction, or in case of emergency go directly to the emergency room at the closest medical facility.     Cervical Strain and Sprain Rehab Ask your health care provider which exercises are safe for you. Do exercises exactly as told by your health care provider and adjust them as directed. It is normal to feel mild stretching, pulling, tightness, or discomfort as you do these exercises, but you should stop right away if you feel sudden pain or your pain gets worse.Do not begin these exercises until told by your health care provider. Stretching and range of motion exercises These exercises warm up your muscles and joints and improve the movement and flexibility of your neck. These exercises also help to relieve pain, numbness, and tingling. Exercise A: Cervical side bend 1. Using good posture, sit on a stable chair or stand up. 2. Without moving your shoulders, slowly tilt your left / right ear to your shoulder until you feel a stretch in your neck muscles. You should be looking straight ahead. 3. Hold for __________ seconds. 4. Repeat with the other side of your neck. Repeat __________ times. Complete this exercise __________ times a day. Exercise B: Cervical rotation 1. Using good posture, sit on a stable chair or stand up. 2. Slowly turn your head to the side as if you are looking over your left / right shoulder.  Keep your eyes level with the ground.  Stop when you feel a stretch along the side and the back of your neck. 3. Hold for __________ seconds. 4. Repeat this by turning to your other side. Repeat __________ times. Complete this exercise __________ times a  day. Exercise C: Thoracic extension and pectoral stretch 1. Roll a towel or a small blanket so it is about 4 inches (10 cm) in diameter. 2. Lie down on your back on a firm surface. 3. Put the towel lengthwise, under your spine in the middle of your back. It should not be not under your shoulder blades. The towel should line up with your spine from your middle back to your lower back. 4. Put your hands behind your head and let your elbows fall out to your sides. 5. Hold for __________ seconds. Repeat __________ times. Complete this exercise __________ times a day. Strengthening exercises These exercises build strength and endurance in your neck. Endurance is the ability to use your muscles for a long time, even after your muscles get tired. Exercise D: Upper cervical flexion, isometric 1. Lie on your back with a thin pillow behind your head and a small rolled-up towel under your neck. 2. Gently tuck your chin toward your chest and nod your head down to look toward your feet. Do not lift your head off the pillow. 3. Hold for __________ seconds. 4. Release the tension slowly. Relax your neck muscles completely before you repeat this exercise. Repeat __________ times. Complete this exercise __________ times a day. Exercise E: Cervical extension, isometric 1. Stand about 6 inches (15 cm) away from a wall, with your back facing the wall. 2. Place a soft object, about 6-8 inches (15-20 cm) in diameter, between the back of your head and the wall.  A soft object could be a small pillow, a ball, or a folded towel. 3. Gently tilt your head back and press into the soft object. Keep your jaw and forehead relaxed. 4. Hold for __________ seconds. 5. Release the tension slowly. Relax your neck muscles completely before you repeat this exercise. Repeat __________ times. Complete this exercise __________ times a day. Posture and body mechanics   Body mechanics refers to the movements and positions of your body  while you do your daily activities. Posture is part of body mechanics. Good posture and healthy body mechanics can help to relieve stress in your body's tissues and joints. Good posture means that your spine is in its natural S-curve position (your spine is neutral), your shoulders are pulled back slightly, and your head is not tipped forward. The following are general guidelines for applying improved posture and body mechanics to your everyday activities. Standing  When standing, keep your spine neutral and keep your feet about hip-width apart. Keep a slight bend in your knees. Your ears, shoulders, and hips should line up.  When you do a task in which you stand in one place for a long time, place one foot up on a stable object that is 2-4 inches (5-10 cm) high, such as a footstool. This helps keep your spine neutral. Sitting  When sitting, keep your spine neutral and your keep feet flat on the floor. Use a footrest, if necessary, and keep your thighs parallel to the floor. Avoid rounding your shoulders, and avoid tilting your head forward.  When working at a desk or a computer, keep your desk at a height where your hands are slightly lower than your elbows. Slide your chair under your desk so you are close enough to maintain good posture.  When working at a computer, place your monitor at a height where you are looking straight ahead and you do not have to tilt your head forward or downward to look at the screen. Resting When lying down and resting, avoid positions that are most painful for you. Try to support your neck in a neutral position. You can use a contour pillow or a small rolled-up towel. Your pillow should support your neck but not push on it. This information is not intended to replace advice given to you by your health care provider. Make sure you discuss any questions you have with your health care provider. Document Released: 01/09/2005 Document Revised: 09/16/2015 Document Reviewed:  12/16/2014 Elsevier Interactive Patient Education  2017 [employer].

## 2016-03-01 NOTE — Assessment & Plan Note (Signed)
Numbness and tingling of the left hand improved since initial onset with no significant trauma most likely related to postural imbalances and neck stiffness and tightness. Start ice/moist heat and home exercise therapy for the neck. Follow-up if symptoms return or worsen for possible referral for possible nerve conduction study if needed.

## 2016-03-27 ENCOUNTER — Ambulatory Visit: Payer: BLUE CROSS/BLUE SHIELD | Admitting: Family

## 2016-05-10 DIAGNOSIS — E669 Obesity, unspecified: Secondary | ICD-10-CM | POA: Diagnosis not present

## 2016-05-10 DIAGNOSIS — Z01419 Encounter for gynecological examination (general) (routine) without abnormal findings: Secondary | ICD-10-CM | POA: Diagnosis not present

## 2016-05-10 DIAGNOSIS — E119 Type 2 diabetes mellitus without complications: Secondary | ICD-10-CM | POA: Diagnosis not present

## 2016-08-29 ENCOUNTER — Ambulatory Visit (HOSPITAL_COMMUNITY)
Admission: EM | Admit: 2016-08-29 | Discharge: 2016-08-29 | Disposition: A | Discharge status: Home / Self Care | Payer: 59 | Attending: Family Medicine | Admitting: Family Medicine

## 2016-08-29 ENCOUNTER — Ambulatory Visit (INDEPENDENT_AMBULATORY_CARE_PROVIDER_SITE_OTHER): Payer: 59

## 2016-08-29 ENCOUNTER — Encounter (HOSPITAL_COMMUNITY): Payer: Self-pay

## 2016-08-29 DIAGNOSIS — R600 Localized edema: Secondary | ICD-10-CM

## 2016-08-29 DIAGNOSIS — M79672 Pain in left foot: Secondary | ICD-10-CM | POA: Diagnosis not present

## 2016-08-29 MED ORDER — FUROSEMIDE 20 MG PO TABS: 20.0000 mg | ORAL_TABLET | Freq: Every day | ORAL | 0 refills | Status: DC

## 2016-08-29 NOTE — ED Triage Notes (Signed)
Patient presents to [facility] with swelling in left foot since Sunday 08/27/2016, pt denies any injury she states pain started last Thursday then swelling started Sunday she thought maybe she had stubbed her toe in the pool but now thinks she may have been bitten by a bug

## 2016-08-29 NOTE — Discharge Instructions (Signed)
Swelling happens when fluid collects in small spaces around tissues and organs inside the body. Another word for swelling is "edema." Some common parts of the body where people can have swelling are the lower legs or hands. This typically is worse in the areas of the body that are closest to the ground (because of gravity)  Symptoms of swelling can include puffiness of the skin, which can cause the skin to look stretched and shiny. This often occurs with swelling in the lower legs and can be worse after you sit or stand for a long time.  Treatment of edema includes several components: treatment of the underlying cause (if possible), reducing the amount of salt (sodium) in your diet, and, in many cases, use of a medication called a diuretic to eliminate excess fluid. Using compression stockings and elevating the legs may also be recommended.   

## 2016-08-30 NOTE — ED Provider Notes (Signed)
  [facility]   [id number] 08/29/16 Arrival Time: 1700  ASSESSMENT & PLAN:  1. Edema of left foot   2. Foot pain, left     Meds ordered this encounter  Medications  . furosemide (LASIX) 20 MG tablet    Sig: Take 1 tablet (20 mg total) by mouth daily.    Dispense:  5 tablet    Refill:  0   Notice increased BP. She will f/u with PCP for this also. No abnormality seen on x-ray of L foot this evening.  Reviewed expectations re: course of current medical issues. Questions answered. Outlined signs and symptoms indicating need for more acute intervention. Patient verbalized understanding. After Visit Summary given.   SUBJECTIVE:  Whitney [name] is a 31 y.o. female who presents with complaint of acute onset edema of L foot. Noticed 4 days ago. No injury reported. Had been swimming in pool that day. Ambulatory but foot with discomfort. Prolonged ambulation makes worse. Swelling somewhat better in the morning. Wearing her normal shoes. No extremity sensation changes. L 4th toe with some redness noticed 4 days ago. Not changing. Slight discomfort around distal foot. Afebrile. No h/o similar. No recent or prolonged travel.  ROS: As per HPI.   OBJECTIVE:  Vitals:   08/29/16 1727  BP: (!) 139/111  Pulse: 98  Resp: 17  Temp: 98.9 F (37.2 C)  TempSrc: Oral  SpO2: 99%     General appearance: alert; no distress Lungs: clear to auscultation bilaterally Heart: regular rate and rhythm Extremities: L foot with 1+ edema; tender over distal, dorsal foot; poorly localized; 4th toe with very slight erythema but not warm to touch; no open wounds Skin: warm and dry Neurologic: normal symmetric reflexes; normal gait Psychological:  alert and cooperative; normal mood and affect   Dg Foot Complete Left  Result Date: 08/29/2016 CLINICAL DATA:  Foot pain for 3 days EXAM: LEFT FOOT - COMPLETE 3+ VIEW COMPARISON:  None. FINDINGS: There is no evidence of fracture or dislocation.  Osteophyte formation at the first metatarsal head. The other joint spaces are normal. Moderate dorsal soft tissue swelling. IMPRESSION: No acute osseous abnormality. Moderate soft tissue swelling, particularly of the dorsum of the foot. Electronically Signed   By: Ulyses Jarred M.D.   On: 08/29/2016 18:02    No Known Allergies  PMHx, SurgHx, SocialHx, Medications, and Allergies were reviewed in the Visit Navigator and updated as appropriate.      Vanessa Kick, MD 08/30/16 [phone]

## 2017-04-16 ENCOUNTER — Other Ambulatory Visit (HOSPITAL_COMMUNITY): Payer: Self-pay | Admitting: Obstetrics and Gynecology

## 2017-04-16 DIAGNOSIS — R102 Pelvic and perineal pain: Secondary | ICD-10-CM

## 2017-04-20 ENCOUNTER — Ambulatory Visit (HOSPITAL_COMMUNITY)
Admission: RE | Admit: 2017-04-20 | Discharge: 2017-04-20 | Disposition: A | Discharge status: Home / Self Care | Payer: 59 | Source: Ambulatory Visit | Attending: Obstetrics and Gynecology | Admitting: Obstetrics and Gynecology

## 2017-04-20 ENCOUNTER — Encounter (INDEPENDENT_AMBULATORY_CARE_PROVIDER_SITE_OTHER): Payer: Self-pay

## 2017-04-20 DIAGNOSIS — R102 Pelvic and perineal pain: Secondary | ICD-10-CM | POA: Diagnosis not present

## 2017-04-20 DIAGNOSIS — D259 Leiomyoma of uterus, unspecified: Secondary | ICD-10-CM | POA: Insufficient documentation

## 2017-12-11 ENCOUNTER — Encounter (HOSPITAL_COMMUNITY): Payer: Self-pay | Admitting: Emergency Medicine

## 2017-12-11 ENCOUNTER — Ambulatory Visit (INDEPENDENT_AMBULATORY_CARE_PROVIDER_SITE_OTHER): Payer: 59

## 2017-12-11 ENCOUNTER — Ambulatory Visit (HOSPITAL_COMMUNITY)
Admission: EM | Admit: 2017-12-11 | Discharge: 2017-12-11 | Disposition: A | Discharge status: Home / Self Care | Payer: 59 | Attending: Family Medicine | Admitting: Family Medicine

## 2017-12-11 DIAGNOSIS — J069 Acute upper respiratory infection, unspecified: Secondary | ICD-10-CM | POA: Diagnosis not present

## 2017-12-11 DIAGNOSIS — B9789 Other viral agents as the cause of diseases classified elsewhere: Secondary | ICD-10-CM

## 2017-12-11 MED ORDER — ALBUTEROL SULFATE 108 (90 BASE) MCG/ACT IN AEPB: 1.0000 | INHALATION_SPRAY | Freq: Four times a day (QID) | RESPIRATORY_TRACT | 0 refills | Status: DC | PRN

## 2017-12-11 MED ORDER — GUAIFENESIN ER 600 MG PO TB12: 600.0000 mg | ORAL_TABLET | Freq: Two times a day (BID) | ORAL | 0 refills | Status: DC

## 2017-12-11 NOTE — Discharge Instructions (Addendum)
Your x-ray showed a mild trace of bronchitis. I do not hear much wheezing on exam. We will give you an albuterol inhaler to use as needed. He can do 1 to 2 puffs every 6 hours for cough, wheezing, shortness of breath I would like for you to also start taking Mucinex.  This will help with cough, congestion Follow up as needed for continued or worsening symptoms

## 2017-12-11 NOTE — ED Provider Notes (Signed)
[facility]    CSN: [id number] Arrival date & time: 12/11/17  1138     History   Chief Complaint Chief Complaint  Patient presents with  . URI    appt 1200    HPI Whitney [name] is a 32 y.o. female.    URI  Presenting symptoms: congestion, cough, fatigue, fever and rhinorrhea   Severity:  Moderate Duration:  10 days Timing:  Intermittent Progression:  Waxing and waning Chronicity:  New Relieved by:  Nothing Worsened by:  Nothing Ineffective treatments:  OTC medications Associated symptoms: myalgias and wheezing   Associated symptoms: no arthralgias, no headaches, no neck pain, no sinus pain, no sneezing and no swollen glands   Risk factors: diabetes mellitus and sick contacts   Risk factors: no recent illness and no recent travel     Past Medical History:  Diagnosis Date  . Diabetes mellitus without complication ([place])   . Gallstones   . Headache(784.0)   . No pertinent past medical history     Patient Active Problem List   Diagnosis Date Noted  . Numbness and tingling of hand 03/01/2016  . Type 2 diabetes mellitus ([place]) 01/17/2016  . Routine general medical examination at a health care facility 12/27/2015  . Morbid obesity ([place]) 12/27/2015  . Gallstone pancreatitis 03/11/2012  . Abdominal pain, acute 03/11/2012  . Nausea & vomiting 03/11/2012  . Postpartum state 03/11/2012  . Trichimoniasis 01/27/2012  . Group B streptococcal infection in pregnancy 01/27/2012  . Postpartum care following vaginal delivery (1/4) 01/27/2012  . SVD (spontaneous vaginal delivery) 01/27/2012    Past Surgical History:  Procedure Laterality Date  . CHOLECYSTECTOMY N/A 03/12/2012   Procedure: LAPAROSCOPIC CHOLECYSTECTOMY;  Surgeon: Harl Bowie, MD;  Location: [facility];  Service: General;  Laterality: N/A;    OB History    Gravida  1   Para  1   Term  1   Preterm  0   AB  0   Living  1     SAB  0   TAB  0   Ectopic  0   Multiple  0   Live  Births  1            Home Medications    Prior to Admission medications   Medication Sig Start Date End Date Taking? Authorizing Provider  Albuterol Sulfate (PROAIR RESPICLICK) 778 (90 Base) MCG/ACT AEPB Inhale 1-2 puffs into the lungs 4 (four) times daily as needed. 12/11/17   Loura Halt A, NP  Biotin 1 MG CAPS Take by mouth.    [provider]  Blood Glucose Monitoring Suppl (ONE TOUCH ULTRA MINI) w/Device KIT Use meter to check blood sugars 1-4 times daily as instructed. 01/28/16   Golden Circle, FNP  Cholecalciferol (VITAMIN D3) 50000 units TABS Take 50,000 Units by mouth once a week. 12/28/15   Golden Circle, FNP  Dulaglutide (TRULICITY) 2.42 PN/3.6RW SOPN Inject 0.75 mg into the skin once a week. 02/22/16   Golden Circle, FNP  furosemide (LASIX) 20 MG tablet Take 1 tablet (20 mg total) by mouth daily. 08/29/16   Vanessa Kick, MD  glucose blood (ONE TOUCH ULTRA TEST) test strip Use one strip per test. Test blood sugars 1-4 times daily as instructed. 01/28/16   Golden Circle, FNP  guaiFENesin (MUCINEX) 600 MG 12 hr tablet Take 1 tablet (600 mg total) by mouth 2 (two) times daily. 12/11/17   Orvan July, NP  Lancets Misc. (  ONE TOUCH SURESOFT) MISC Use 1 lancet per test. Test blood sugars 1-4 times per day as instructed. 01/31/16   Golden Circle, FNP  Prenatal Vit-Fe Fumarate-FA (PRENATAL MULTIVITAMIN) TABS Take 1 tablet by mouth daily.    [provider]    Family History Family History  Problem Relation Age of Onset  . Healthy Mother   . Ulcerative colitis Father   . Diabetes Maternal Grandmother   . Breast cancer Maternal Grandmother   . Syncope episode Maternal Grandfather   . Benign prostatic hyperplasia Maternal Grandfather   . Brain cancer Paternal Grandmother   . Lung cancer Paternal Grandfather   . Anesthesia problems Neg Hx   . Hypotension Neg Hx   . Malignant hyperthermia Neg Hx   . Pseudochol deficiency Neg Hx     Social  History Social History   Tobacco Use  . Smoking status: Former Smoker    Types: Cigarettes    Last attempt to quit: 05/24/2011    Years since quitting: 6.5  . Smokeless tobacco: Never Used  Substance Use Topics  . Alcohol use: Yes    Comment: Once a month on average  . Drug use: No     Allergies   Patient has no known allergies.   Review of Systems Review of Systems  Constitutional: Positive for fatigue and fever.  HENT: Positive for congestion and rhinorrhea. Negative for sinus pain and sneezing.   Respiratory: Positive for cough and wheezing.   Musculoskeletal: Positive for myalgias. Negative for arthralgias and neck pain.  Neurological: Negative for headaches.     Physical Exam Triage Vital Signs ED Triage Vitals  Enc Vitals Group     BP 12/11/17 1231 (!) 188/82     Pulse Rate 12/11/17 1231 (!) 103     Resp 12/11/17 1231 18     Temp 12/11/17 1231 99.1 F (37.3 C)     Temp Source 12/11/17 1231 Oral     SpO2 12/11/17 1231 95 %     Weight --      Height --      Head Circumference --      Peak Flow --      Pain Score 12/11/17 1232 6     Pain Loc --      Pain Edu? --      Excl. in [place]? --    No data found.  Updated Vital Signs BP (!) 188/82 (BP Location: Right Arm)   Pulse (!) 103   Temp 99.1 F (37.3 C) (Oral)   Resp 18   SpO2 95%   Visual Acuity Right Eye Distance:   Left Eye Distance:   Bilateral Distance:    Right Eye Near:   Left Eye Near:    Bilateral Near:     Physical Exam  Constitutional: She appears well-developed and well-nourished.  Obese, non toxic or ill appearing.   HENT:  Head: Normocephalic and atraumatic.  Right Ear: External ear normal.  Left Ear: External ear normal.  Nose: Nose normal.  Mouth/Throat: Oropharynx is clear and moist.  Eyes: Conjunctivae are normal.  Neck: Normal range of motion.  Cardiovascular: Normal rate, regular rhythm and normal heart sounds.  Pulmonary/Chest: Effort normal and breath sounds normal.   Diminished lung sounds throughout. Pt unable to take a deep breath.  Musculoskeletal: Normal range of motion.  Neurological: She is alert.  Skin: Skin is warm and dry.  Psychiatric: She has a normal mood and affect.  Nursing note and vitals reviewed.  UC Treatments / Results  Labs (all labs ordered are listed, but only abnormal results are displayed) Labs Reviewed - No data to display  EKG None  Radiology Dg Chest 2 View  Result Date: 12/11/2017 CLINICAL DATA:  Cough and chest pain with shortness of breath EXAM: CHEST - 2 VIEW COMPARISON:  None. FINDINGS: Interstitial coarsening diffusely, accentuated by low volumes. There is no edema, consolidation, effusion, or pneumothorax. Normal heart size and mediastinal contours. No osseous findings. IMPRESSION: Interstitial coarsening which may be bronchitic. No focal pneumonia. Electronically Signed   By: Monte Fantasia M.D.   On: 12/11/2017 13:10    Procedures Procedures (including critical care time)  Medications Ordered in UC Medications - No data to display  Initial Impression / Assessment and Plan / UC Course  I have reviewed the triage vital signs and the nursing notes.  Pertinent labs & imaging results that were available during my care of the patient were reviewed by me and considered in my medical decision making (see chart for details).     Patient is a 32 year old female with 10 days of URI symptoms.  Symptoms have been waxing and waning. She has been using over-the-counter medication without relief of symptoms. Reports mild improvement and feeling better today. X-ray revealed mild bronchitis We will have her use Mucinex for cough and congestion Albuterol inhaler to use as needed for cough, wheezing, shortness of breath Final Clinical Impressions(s) / UC Diagnoses   Final diagnoses:  Viral URI with cough     Discharge Instructions     Your x-ray showed a mild trace of bronchitis. I do not hear much  wheezing on exam. We will give you an albuterol inhaler to use as needed. He can do 1 to 2 puffs every 6 hours for cough, wheezing, shortness of breath I would like for you to also start taking Mucinex.  This will help with cough, congestion Follow up as needed for continued or worsening symptoms     ED Prescriptions    Medication Sig Dispense Auth. Provider   guaiFENesin (MUCINEX) 600 MG 12 hr tablet Take 1 tablet (600 mg total) by mouth 2 (two) times daily. 15 tablet Lekeya Rollings A, NP   Albuterol Sulfate (PROAIR RESPICLICK) 735 (90 Base) MCG/ACT AEPB Inhale 1-2 puffs into the lungs 4 (four) times daily as needed. 1 each Orvan July, NP     Controlled Substance Prescriptions [place] Controlled Substance Registry consulted? Not Applicable   Orvan July, NP 12/11/17 1413

## 2017-12-11 NOTE — ED Triage Notes (Signed)
Pt sts URI sx x 10 days

## 2017-12-11 NOTE — ED Notes (Signed)
Bed: UC01 Expected date:  Expected time:  Means of arrival:  Comments: appt 

## 2018-03-24 DIAGNOSIS — I82409 Acute embolism and thrombosis of unspecified deep veins of unspecified lower extremity: Secondary | ICD-10-CM

## 2018-03-24 HISTORY — DX: Acute embolism and thrombosis of unspecified deep veins of unspecified lower extremity: I82.409

## 2018-04-13 ENCOUNTER — Emergency Department (HOSPITAL_COMMUNITY): Payer: 59

## 2018-04-13 ENCOUNTER — Encounter (HOSPITAL_COMMUNITY): Payer: Self-pay

## 2018-04-13 ENCOUNTER — Other Ambulatory Visit: Payer: Self-pay

## 2018-04-13 ENCOUNTER — Ambulatory Visit (INDEPENDENT_AMBULATORY_CARE_PROVIDER_SITE_OTHER)
Admission: EM | Admit: 2018-04-13 | Discharge: 2018-04-13 | Disposition: A | Discharge status: Home / Self Care | Payer: 59 | Source: Home / Self Care | Attending: Family Medicine | Admitting: Family Medicine

## 2018-04-13 ENCOUNTER — Inpatient Hospital Stay (HOSPITAL_COMMUNITY)
Admission: EM | Admit: 2018-04-13 | Discharge: 2018-04-15 | DRG: 176 | Disposition: A | Discharge status: Home / Self Care | Payer: 59 | Attending: Family Medicine | Admitting: Family Medicine

## 2018-04-13 DIAGNOSIS — Z6841 Body Mass Index (BMI) 40.0 and over, adult: Secondary | ICD-10-CM | POA: Diagnosis not present

## 2018-04-13 DIAGNOSIS — Z9114 Patient's other noncompliance with medication regimen: Secondary | ICD-10-CM | POA: Diagnosis not present

## 2018-04-13 DIAGNOSIS — Z9049 Acquired absence of other specified parts of digestive tract: Secondary | ICD-10-CM | POA: Diagnosis not present

## 2018-04-13 DIAGNOSIS — I2699 Other pulmonary embolism without acute cor pulmonale: Principal | ICD-10-CM | POA: Diagnosis present

## 2018-04-13 DIAGNOSIS — E1165 Type 2 diabetes mellitus with hyperglycemia: Secondary | ICD-10-CM | POA: Diagnosis present

## 2018-04-13 DIAGNOSIS — R079 Chest pain, unspecified: Secondary | ICD-10-CM | POA: Diagnosis not present

## 2018-04-13 DIAGNOSIS — Z87891 Personal history of nicotine dependence: Secondary | ICD-10-CM | POA: Diagnosis not present

## 2018-04-13 DIAGNOSIS — Z7984 Long term (current) use of oral hypoglycemic drugs: Secondary | ICD-10-CM | POA: Diagnosis not present

## 2018-04-13 DIAGNOSIS — I82432 Acute embolism and thrombosis of left popliteal vein: Secondary | ICD-10-CM | POA: Diagnosis present

## 2018-04-13 DIAGNOSIS — R0602 Shortness of breath: Secondary | ICD-10-CM

## 2018-04-13 DIAGNOSIS — M79605 Pain in left leg: Secondary | ICD-10-CM | POA: Diagnosis not present

## 2018-04-13 DIAGNOSIS — Z79899 Other long term (current) drug therapy: Secondary | ICD-10-CM | POA: Diagnosis not present

## 2018-04-13 DIAGNOSIS — I10 Essential (primary) hypertension: Secondary | ICD-10-CM

## 2018-04-13 DIAGNOSIS — Z833 Family history of diabetes mellitus: Secondary | ICD-10-CM

## 2018-04-13 DIAGNOSIS — I82442 Acute embolism and thrombosis of left tibial vein: Secondary | ICD-10-CM | POA: Diagnosis present

## 2018-04-13 DIAGNOSIS — E119 Type 2 diabetes mellitus without complications: Secondary | ICD-10-CM | POA: Diagnosis not present

## 2018-04-13 LAB — COMPREHENSIVE METABOLIC PANEL
ALT: 39 U/L (ref 0–44)
AST: 24 U/L (ref 15–41)
Albumin: 3.7 g/dL (ref 3.5–5.0)
Alkaline Phosphatase: 77 U/L (ref 38–126)
Anion gap: 8 (ref 5–15)
BILIRUBIN TOTAL: 0.9 mg/dL (ref 0.3–1.2)
CHLORIDE: 107 mmol/L (ref 98–111)
CO2: 23 mmol/L (ref 22–32)
CREATININE: 0.91 mg/dL (ref 0.44–1.00)
Calcium: 10 mg/dL (ref 8.9–10.3)
GFR calc Af Amer: >60 mL/min (ref >60)
GFR calc non Af Amer: >60 mL/min (ref >60)
GLUCOSE: 199 mg/dL — AB (ref 70–99)
POTASSIUM: 3.7 mmol/L (ref 3.5–5.1)
Sodium: 138 mmol/L (ref 135–145)
Total Protein: 7.4 g/dL (ref 6.5–8.1)

## 2018-04-13 LAB — CBC
HEMATOCRIT: 43.6 % (ref 36.0–46.0)
HEMOGLOBIN: 14.2 g/dL (ref 12.0–15.0)
MCH: 26.4 pg (ref 26.0–34.0)
MCHC: 32.6 g/dL (ref 30.0–36.0)
MCV: 81 fL (ref 80.0–100.0)
Platelets: 185 10*3/uL (ref 150–400)
RBC: 5.38 MIL/uL — AB (ref 3.87–5.11)
RDW: 12.3 % (ref 11.5–15.5)
WBC: 10.9 10*3/uL — ABNORMAL HIGH (ref 4.0–10.5)
nRBC: 0 % (ref 0.0–0.2)

## 2018-04-13 LAB — I-STAT BETA HCG BLOOD, ED (MC, WL, AP ONLY): I-stat hCG, quantitative: <5 m[IU]/mL (ref <5)

## 2018-04-13 LAB — PROTIME-INR
INR: 1.1 (ref 0.8–1.2)
PROTHROMBIN TIME: 14.4 s (ref 11.4–15.2)

## 2018-04-13 LAB — HEPARIN LEVEL (UNFRACTIONATED): Heparin Unfractionated: 0.18 IU/mL — ABNORMAL LOW (ref 0.30–0.70)

## 2018-04-13 LAB — GLUCOSE, CAPILLARY
Glucose-Capillary: 178 mg/dL — ABNORMAL HIGH (ref 70–99)
Glucose-Capillary: 172 mg/dL — ABNORMAL HIGH (ref 70–99)

## 2018-04-13 LAB — D-DIMER, QUANTITATIVE (NOT AT ARMC): D DIMER QUANT: 8.06 ug{FEU}/mL — AB (ref 0.00–0.50)

## 2018-04-13 LAB — APTT: aPTT: 25 seconds (ref 24–36)

## 2018-04-13 LAB — I-STAT TROPONIN, ED: TROPONIN I, POC: 0.03 ng/mL (ref 0.00–0.08)

## 2018-04-13 MED ORDER — ACETAMINOPHEN 325 MG PO TABS
650.0000 mg | ORAL_TABLET | Freq: Once | ORAL | Status: AC
  Administered 2018-04-13: 650 mg via ORAL
  Filled 2018-04-13: qty 2

## 2018-04-13 MED ORDER — HEPARIN BOLUS VIA INFUSION
6500.0000 [IU] | Freq: Once | INTRAVENOUS | Status: AC
  Administered 2018-04-13: 6500 [IU] via INTRAVENOUS
  Filled 2018-04-13: qty 6500

## 2018-04-13 MED ORDER — INSULIN ASPART 100 UNIT/ML ~~LOC~~ SOLN
0.0000 [IU] | Freq: Three times a day (TID) | SUBCUTANEOUS | Status: DC
  Administered 2018-04-14: 2 [IU] via SUBCUTANEOUS
  Administered 2018-04-14: 3 [IU] via SUBCUTANEOUS
  Administered 2018-04-14: 1 [IU] via SUBCUTANEOUS
  Administered 2018-04-15 (×2): 2 [IU] via SUBCUTANEOUS

## 2018-04-13 MED ORDER — ACETAMINOPHEN 325 MG PO TABS
650.0000 mg | ORAL_TABLET | Freq: Four times a day (QID) | ORAL | Status: DC | PRN
  Administered 2018-04-14 (×2): 650 mg via ORAL
  Filled 2018-04-13 (×2): qty 2

## 2018-04-13 MED ORDER — ASPIRIN 81 MG PO CHEW
324.0000 mg | CHEWABLE_TABLET | Freq: Once | ORAL | Status: AC
  Administered 2018-04-13: 324 mg via ORAL
  Filled 2018-04-13: qty 4

## 2018-04-13 MED ORDER — POLYETHYLENE GLYCOL 3350 17 G PO PACK: 17.0000 g | PACK | Freq: Every day | ORAL | Status: DC | PRN

## 2018-04-13 MED ORDER — ACETAMINOPHEN 650 MG RE SUPP: 650.0000 mg | Freq: Four times a day (QID) | RECTAL | Status: DC | PRN

## 2018-04-13 MED ORDER — HEPARIN BOLUS VIA INFUSION
3000.0000 [IU] | Freq: Once | INTRAVENOUS | Status: AC
  Administered 2018-04-13: 3000 [IU] via INTRAVENOUS
  Filled 2018-04-13: qty 3000

## 2018-04-13 MED ORDER — IOHEXOL 350 MG/ML SOLN
75.0000 mL | Freq: Once | INTRAVENOUS | Status: AC | PRN
  Administered 2018-04-13: 100 mL via INTRAVENOUS

## 2018-04-13 MED ORDER — HEPARIN (PORCINE) 25000 UT/250ML-% IV SOLN
2200.0000 [IU]/h | INTRAVENOUS | Status: DC
  Administered 2018-04-13: 1800 [IU]/h via INTRAVENOUS
  Filled 2018-04-13 (×2): qty 250

## 2018-04-13 NOTE — ED Notes (Signed)
Report given to 3E 

## 2018-04-13 NOTE — Progress Notes (Addendum)
VASCULAR LAB PRELIMINARY  PRELIMINARY  PRELIMINARY  PRELIMINARY  Left lower extremity venous duplex completed.    Preliminary report:  See CV proc for results.  Gave results to [facility], PA-C  Michelle Wnek, RVT 04/13/2018, 4:36 PM

## 2018-04-13 NOTE — ED Provider Notes (Signed)
[place]    CSN: [id number] Arrival date & time: 04/13/18  1023     History   Chief Complaint Chief Complaint  Patient presents with  . Leg Pain  . Shortness of Breath    HPI Whitney [name] is a 33 y.o. female.   [name] presents with complaints of left lower leg pain as well as chest tightness and shortness of breath . States her lower leg started hurting approximately 2 weeks ago but has worsened over the past few days. Worse at night and in a dependent position. States it feels tingling and like "circulation is off." no cough but feels wheezing and tightness. No asthma history. No congestion, sore throat or ear pain. States becomes shortness of breath  With very little activity. Doesn't smoke. Has a nexplanon. Drove to [facility] approximately 3 weeks ago. No recent hospitalizations. No fever. No redness or swelling to leg. No history of blood clot. Hx of DM, she is out of her trulicity so hasn't been taking any medications for her diabetes.     ROS per HPI, negative if not otherwise mentioned.      Past Medical History:  Diagnosis Date  . Diabetes mellitus without complication ([place])   . Gallstones   . Headache(784.0)   . No pertinent past medical history     Patient Active Problem List   Diagnosis Date Noted  . Numbness and tingling of hand 03/01/2016  . Type 2 diabetes mellitus ([place]) 01/17/2016  . Routine general medical examination at a health care facility 12/27/2015  . Morbid obesity ([place]) 12/27/2015  . Gallstone pancreatitis 03/11/2012  . Abdominal pain, acute 03/11/2012  . Nausea & vomiting 03/11/2012  . Postpartum state 03/11/2012  . Trichimoniasis 01/27/2012  . Group B streptococcal infection in pregnancy 01/27/2012  . Postpartum care following vaginal delivery (1/4) 01/27/2012  . SVD (spontaneous vaginal delivery) 01/27/2012    Past Surgical History:  Procedure Laterality Date  . CHOLECYSTECTOMY N/A 03/12/2012   Procedure: LAPAROSCOPIC  CHOLECYSTECTOMY;  Surgeon: Harl Bowie, MD;  Location: [place];  Service: General;  Laterality: N/A;    OB History    Gravida  1   Para  1   Term  1   Preterm  0   AB  0   Living  1     SAB  0   TAB  0   Ectopic  0   Multiple  0   Live Births  1            Home Medications    Prior to Admission medications   Medication Sig Start Date End Date Taking? Authorizing Provider  Biotin 1 MG CAPS Take by mouth.   Yes [provider]  Blood Glucose Monitoring Suppl (ONE TOUCH ULTRA MINI) w/Device KIT Use meter to check blood sugars 1-4 times daily as instructed. 01/28/16  Yes Golden Circle, FNP  Dulaglutide (TRULICITY) 5.28 UX/3.2GM SOPN Inject 0.75 mg into the skin once a week. 02/22/16  Yes Golden Circle, FNP  glucose blood (ONE TOUCH ULTRA TEST) test strip Use one strip per test. Test blood sugars 1-4 times daily as instructed. 01/28/16  Yes Golden Circle, FNP  Prenatal Vit-Fe Fumarate-FA (PRENATAL MULTIVITAMIN) TABS Take 1 tablet by mouth daily.   Yes [provider]  Albuterol Sulfate (PROAIR RESPICLICK) 010 (90 Base) MCG/ACT AEPB Inhale 1-2 puffs into the lungs 4 (four) times daily as needed. 12/11/17   Orvan July, NP  Cholecalciferol (  VITAMIN D3) 50000 units TABS Take 50,000 Units by mouth once a week. 12/28/15   Golden Circle, FNP  furosemide (LASIX) 20 MG tablet Take 1 tablet (20 mg total) by mouth daily. 08/29/16   Vanessa Kick, MD  guaiFENesin (MUCINEX) 600 MG 12 hr tablet Take 1 tablet (600 mg total) by mouth 2 (two) times daily. 12/11/17   Loura Halt A, NP  Lancets Misc. (ONE TOUCH SURESOFT) MISC Use 1 lancet per test. Test blood sugars 1-4 times per day as instructed. 01/31/16   Golden Circle, FNP    Family History Family History  Problem Relation Age of Onset  . Healthy Mother   . Ulcerative colitis Father   . Diabetes Maternal Grandmother   . Breast cancer Maternal Grandmother   . Syncope episode Maternal Grandfather    . Benign prostatic hyperplasia Maternal Grandfather   . Brain cancer Paternal Grandmother   . Lung cancer Paternal Grandfather   . Anesthesia problems Neg Hx   . Hypotension Neg Hx   . Malignant hyperthermia Neg Hx   . Pseudochol deficiency Neg Hx     Social History Social History   Tobacco Use  . Smoking status: Former Smoker    Types: Cigarettes    Last attempt to quit: 05/24/2011    Years since quitting: 6.8  . Smokeless tobacco: Never Used  Substance Use Topics  . Alcohol use: Yes    Comment: Once a month on average  . Drug use: No     Allergies   Patient has no known allergies.   Review of Systems Review of Systems   Physical Exam Triage Vital Signs ED Triage Vitals  Enc Vitals Group     BP 04/13/18 1044 (!) 163/121     Pulse Rate 04/13/18 1044 (!) 113     Resp 04/13/18 1044 18     Temp 04/13/18 1044 98.6 F (37 C)     Temp src --      SpO2 04/13/18 1044 96 %     Weight --      Height --      Head Circumference --      Peak Flow --      Pain Score 04/13/18 1048 7     Pain Loc --      Pain Edu? --      Excl. in [place]? --    No data found.  Updated Vital Signs BP (!) 163/121 (BP Location: Left Arm)   Pulse (!) 113   Temp 98.6 F (37 C)   Resp 18   SpO2 96%    Physical Exam Constitutional:      General: She is not in acute distress.    Appearance: She is well-developed.  Cardiovascular:     Rate and Rhythm: Regular rhythm. Tachycardia present.     Heart sounds: Normal heart sounds.  Pulmonary:     Effort: Pulmonary effort is normal.     Breath sounds: Normal breath sounds.  Musculoskeletal:     Comments: Left lower anterior tenderness and pain with dorsiflexion; no redness, warmth; measurements of calves equal bilaterally; no specific calf tenderness; foot warm and cap refill <2; strong pedal pulse; ambulatory with limp   Skin:    General: Skin is warm and dry.  Neurological:     Mental Status: She is alert and oriented to person, place,  and time.      UC Treatments / Results  Labs (all labs ordered are listed, but only  abnormal results are displayed) Labs Reviewed - No data to display  EKG None  Radiology No results found.  Procedures Procedures (including critical care time)  Medications Ordered in UC Medications - No data to display  Initial Impression / Assessment and Plan / UC Course  I have reviewed the triage vital signs and the nursing notes.  Pertinent labs & imaging results that were available during my care of the patient were reviewed by me and considered in my medical decision making (see chart for details).     Tachycardia, lower leg pain, shortness of breath. Feel that at minimum a d-dimer for dvt rule out is appropriate. Neuropathy, muscle strain, viral URI considered and discussed. Patient agreeable to plan, safe for self transport to ED.  Final Clinical Impressions(s) / UC Diagnoses   Final diagnoses:  Left leg pain  Shortness of breath     Discharge Instructions     Please go to the ER for further evaluation of your leg pain and shortness of breath as I feel that ruling out a blood clot is appropriate with these symptoms and elevated heart rate.    ED Prescriptions    None     Controlled Substance Prescriptions [place] Controlled Substance Registry consulted? Not Applicable   Zigmund Gottron, NP 04/13/18 1129

## 2018-04-13 NOTE — ED Notes (Signed)
Pt back from CT

## 2018-04-13 NOTE — Discharge Instructions (Signed)
Please go to the ER for further evaluation of your leg pain and shortness of breath as I feel that ruling out a blood clot is appropriate with these symptoms and elevated heart rate.

## 2018-04-13 NOTE — ED Notes (Signed)
US tech at bedside

## 2018-04-13 NOTE — ED Triage Notes (Signed)
Pt presents today with left leg pain that has been going on for a couple of weeks. Feels like circulation is cut off. Gets worse at night. Diabetic and describes it has burning pain. Nothing seems to help relieve pain. Also states she has had some SOB for the past 3 days. Some wheezing. No cough, no fever, no body aches, or chills.

## 2018-04-13 NOTE — ED Notes (Signed)
Patient transported to X-ray 

## 2018-04-13 NOTE — H&P (Addendum)
[facility] Admission History and Physical Service Pager: [phone]  Patient name: Whitney [name] Medical record number: [id number] Date of birth: 1985-09-07 Age: 33 y.o. Gender: female  Primary Care Provider: Patient, No Pcp Per Consultants: None Code Status: Full  Chief Complaint: Dyspnea on exertion, left leg pain  Assessment and Plan: Whitney [name] is a 33 y.o. female presenting with dyspnea on exertion, left leg pain concerning for pulmonary embolus secondary to left lower extremity DVT. PMH is significant for morbid obesity, type 2 diabetes.  Provoked bilateral PE 2/2 LLE DVT - patient reports 2-week history of gradually worsening left lower extremity pain that started with "pins-and-needles" that evolved into constant pain from the knee to the ankle.  Patient went to urgent care earlier this morning who then sent her to the emergency department.  Minimal asymmetry of lower extremities on exam.  Patient saturating well on room air.  No hypotension.  No tachypnea.  Tachycardia minimal in the 100-110 range.  DVT involving popliteal, posterior tibial vein seen on Doppler ultrasound.  CTA shows moderate right-sided pulmonary emboli with small more peripheral left pulmonary emboli, positive for acute PE with some evidence of right heart strain.  Risk factors for DVT include: Morbid obesity, sedentary lifestyle, recent car ride of over 12 hours in 1 day, birth control (Nexplanon), family history 3+ family members with DVT.  Patient states she "cannot swallow pills", also has previous compliance issues related to affordability of medication.  We will have to select anticoagulation with these factors in mind.  Will defer coagulopathy work-up at this time given this is her first PE and with known provoking factors. -Admit inpatient, medical telemetry, Dr. Andria Frames attending - Heparin GTT - Continuous cardiac monitoring, continuous pulse ox. - Transition to long-term  anticoagulation likely tomorrow - Heart healthy/carb modified diet - A.m. EKG - Follow-up echocardiogram - Follow-up a.m. CBC/BMP/TSH/A1c - Tylenol for pain  Type 2 Diabetes Last A1c 12/2015 7.6.  Previously on trulicity, but cannot afford medications, and cannot "swallow pills". - Sensitive SSI -Follow-up A1c - Monitor blood glucose  HTN 157/109 on admission.  Has been told in the past she has hypertension, on no medication currently.  Will need to take into account affordability when placing patient on outpatient antihypertensive medication.  Amlodipine and lisinopril are free at [facility], or consider $4 list medication from [facility]. - Monitor HTN, initiate antihypertensive treatment if persistently elevated.  FEN/GI: Heart healthy/carb modified diet Prophylaxis: Heparin drip  Disposition: med tele  History of Present Illness:  Whitney [name] is a 33 y.o. female presenting with L leg pain that started 2 weeks ago, and at the knee and radiating downwards.  Reports it started as tingling but then it progressed to more constant pain.  On Thursday night she states the pain was so bad she decided to follow-up with urgent care but they did not have an appointment until today.  She states she started getting short of breath 2-3 days ago, especially noticed when walking from her car to her home or going upstairs.  She has not experienced any shortness of breath at rest.  She has not noticed any swelling of her legs.   She went to urgent care earlier this morning and had her legs measured with no changes in size. She states she has been checking her feet since she is a known diabetic but has not noticed any wounds.  Also endorsing some chest pressure when she is short of breath without  radiation, cough, fever.  She has not been getting much sleep due to the pain.  She has not tried anything to help with the pain and has nothing makes it better.  States pain is 7-8/10.  Has noticed her heart racing but is  only when she is short of breath.  States this is never happened before.  Is a non-smoker.  Denies alcohol or drug use.  Has Nexplanon in her right arm for the past 3 years, has an appointment on Thursday to remove it.  She does not menstruate with the Nexplanon.  She is G1P1001 without any delivery complications.  Did have cholestasis during pregnancy, s/p cholecystectomy 6 weeks postpartum.  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  Did have a long car ride about 6-7 hours to [place] a few weeks ago and came back later that day.  Notes her leg pain started the weekend after she came back.  States she made an effort to make frequent stops and walk around during that trip.  Denies any known personal or family history of clotting disorders although notes her maternal grandmother and maternal aunt had DVTs and PEs.  All 4 grandparents with cancer history (prostate, breast, pancreatic, brain, lung).  Review Of Systems: Per HPI with the following additions:   Review of Systems  Constitutional: Negative for fever.  HENT: Negative for sore throat.   Eyes: Negative for blurred vision.  Respiratory: Positive for shortness of breath. Negative for cough.   Cardiovascular: Positive for chest pain and palpitations.  Gastrointestinal: Negative for abdominal pain, diarrhea, nausea and vomiting.  Genitourinary: Negative for dysuria, frequency and urgency.  Neurological: Negative for dizziness and headaches.   Patient Active Problem List   Diagnosis Date Noted  . Numbness and tingling of hand 03/01/2016  . Type 2 diabetes mellitus ([place]) 01/17/2016  . Routine general medical examination at a health care facility 12/27/2015  . Morbid obesity ([place]) 12/27/2015  . Gallstone pancreatitis 03/11/2012  . Abdominal pain, acute 03/11/2012  . Nausea & vomiting 03/11/2012  . Postpartum state 03/11/2012  . Trichimoniasis 01/27/2012  . Group B streptococcal infection in pregnancy 01/27/2012  .  Postpartum care following vaginal delivery (1/4) 01/27/2012  . SVD (spontaneous vaginal delivery) 01/27/2012   Past Medical History: Past Medical History:  Diagnosis Date  . Diabetes mellitus without complication ([place])   . Gallstones   . Headache(784.0)   . No pertinent past medical history    Past Surgical History: Past Surgical History:  Procedure Laterality Date  . CHOLECYSTECTOMY N/A 03/12/2012   Procedure: LAPAROSCOPIC CHOLECYSTECTOMY;  Surgeon: Harl Bowie, MD;  Location: [place];  Service: General;  Laterality: N/A;   Social History: Social History   Tobacco Use  . Smoking status: Former Smoker    Types: Cigarettes    Last attempt to quit: 05/24/2011    Years since quitting: 6.8  . Smokeless tobacco: Never Used  Substance Use Topics  . Alcohol use: Yes    Comment: Once a month on average  . Drug use: No   Additional social history: Lives at home with her 68-year-old son Please also refer to relevant sections of EMR.  Family History: Family History  Problem Relation Age of Onset  . Healthy Mother   . Ulcerative colitis Father   . Diabetes Maternal Grandmother   . Breast cancer Maternal Grandmother   . Syncope episode Maternal Grandfather   . Benign prostatic hyperplasia Maternal Grandfather   .  Brain cancer Paternal Grandmother   . Lung cancer Paternal Grandfather   . Anesthesia problems Neg Hx   . Hypotension Neg Hx   . Malignant hyperthermia Neg Hx   . Pseudochol deficiency Neg Hx    Maternal grandmother with breast and pancreatic cancer 2s. Maternal grandfather prostate cancer Paternal grandmother with brain cancer Paternal grandfather with lung cancer. No family history of ovarian or endometrial cancers  Allergies and Medications: No Known Allergies No current facility-administered medications on file prior to encounter.    Current Outpatient Medications on File Prior to Encounter  Medication Sig Dispense Refill  . Biotin 1 MG CAPS Take 1 mg  by mouth daily.     Marland Kitchen etonogestrel (NEXPLANON) 68 MG IMPL implant 1 each by Subdermal route once.    . Prenatal Vit-Fe Fumarate-FA (PRENATAL MULTIVITAMIN) TABS Take 1 tablet by mouth daily.    . Albuterol Sulfate (PROAIR RESPICLICK) 244 (90 Base) MCG/ACT AEPB Inhale 1-2 puffs into the lungs 4 (four) times daily as needed. (Patient not taking: Reported on 04/13/2018) 1 each 0  . Blood Glucose Monitoring Suppl (ONE TOUCH ULTRA MINI) w/Device KIT Use meter to check blood sugars 1-4 times daily as instructed. 1 each 0  . Cholecalciferol (VITAMIN D3) 50000 units TABS Take 50,000 Units by mouth once a week. (Patient not taking: Reported on 04/13/2018) 8 tablet 0  . Dulaglutide (TRULICITY) 0.10 UV/2.5DG SOPN Inject 0.75 mg into the skin once a week. (Patient not taking: Reported on 04/13/2018) 4 pen 2  . furosemide (LASIX) 20 MG tablet Take 1 tablet (20 mg total) by mouth daily. (Patient not taking: Reported on 04/13/2018) 5 tablet 0  . glucose blood (ONE TOUCH ULTRA TEST) test strip Use one strip per test. Test blood sugars 1-4 times daily as instructed. 100 each 12  . guaiFENesin (MUCINEX) 600 MG 12 hr tablet Take 1 tablet (600 mg total) by mouth 2 (two) times daily. (Patient not taking: Reported on 04/13/2018) 15 tablet 0  . Lancets Misc. (ONE TOUCH SURESOFT) MISC Use 1 lancet per test. Test blood sugars 1-4 times per day as instructed. 1 each 1    Objective: BP (!) 151/105 (BP Location: Left Wrist)   Pulse (!) 104   Temp 100.2 F (37.9 C) (Oral)   Resp 20   Ht '5\' 11"'  (1.803 m)   Wt (!) 163.3 kg   SpO2 96%   BMI 50.21 kg/m  Exam: General: Alert and oriented.  No acute distress.  On room air. Eyes: PERRLA, EOMI, no scleral icterus. ENTM: Moist oral mucosa.  Uvula midline. Neck: No cervical lymphadenopathy. Cardiovascular: Regular rhythm.  Tachycardic rate in the low 110s.  2+ radial pulse bilaterally.  No pitting edema. Respiratory: Decreased inspiratory sounds.  No crackles or wheezes heard.  No  tachypnea. Gastrointestinal: Soft, nontender to palpation.  Large pannus.  Normal bowel sounds. MSK: 5/5 strength upper and lower extremities bilaterally. Derm: No rashes, skin warm and dry. Neuro: Cranial nerves II through XII grossly intact.  Sensation intact. Psych: Pleasant affect, makes eye contact, spontaneous speech. Extremity:1cm larger on L calf. No erythema.  Nontender to palpation on lower extremities bilaterally  Labs and Imaging: CBC BMET  Recent Labs  Lab 04/13/18 1204  WBC 10.9*  HGB 14.2  HCT 43.6  PLT 185   Recent Labs  Lab 04/13/18 1204  NA 138  K 3.7  CL 107  CO2 23  BUN <5*  CREATININE 0.91  GLUCOSE 199*  CALCIUM 10.0  Dg Chest 2 View  Result Date: 04/13/2018 CLINICAL DATA:  Shortness of breath and chest tightness today. EXAM: CHEST - 2 VIEW COMPARISON:  PA and lateral chest 12/11/2017. FINDINGS: Lungs clear. Heart size normal. No pneumothorax or pleural fluid. No acute or focal bony abnormality. IMPRESSION: Negative chest. Electronically Signed   By: Inge Rise M.D.   On: 04/13/2018 12:30   Ct Angio Chest Pe W/cm &/or Wo Cm  Result Date: 04/13/2018 CLINICAL DATA:  Left leg pain a couple of weeks. Some shortness of breath over the past 3 days. EXAM: CT ANGIOGRAPHY CHEST WITH CONTRAST TECHNIQUE: Multidetector CT imaging of the chest was performed using the standard protocol during bolus administration of intravenous contrast. Multiplanar CT image reconstructions and MIPs were obtained to evaluate the vascular anatomy. CONTRAST:  163m OMNIPAQUE IOHEXOL 350 MG/ML SOLN COMPARISON:  Chest x-ray today. FINDINGS: Cardiovascular: Heart size is normal. Thoracic aorta is normal. Pulmonary arterial system is well opacified demonstrates moderate emboli over the distal main right pulmonary artery extending into the proximal upper, middle and lower lobar arteries. There are several small more peripheral left-sided pulmonary emboli. There is evidence of right heart  strain with RV/LV ratio of 51.8/44.4 equals 1.16. Remaining vascular structures are unremarkable. Mediastinum/Nodes: No significant mediastinal or hilar adenopathy. Remaining mediastinal structures are unremarkable. Lungs/Pleura: Lungs are well inflated without focal airspace consolidation or effusion. Very subtle hazy left perihilar attenuation likely mild vascular congestion. 2 mm nodular density over the medial right upper lobe. 5 mm nodular density over the left lower lobe. Airways are normal. Upper Abdomen: No acute findings. Musculoskeletal: Unremarkable. Review of the MIP images confirms the above findings. IMPRESSION: Moderate right-sided pulmonary emboli as described with small more peripheral left pulmonary emboli. Positive for acute PE with CT evidence of right heart strain (RV/LV Ratio = 1.16) consistent with at least submassive (intermediate risk) PE. The presence of right heart strain has been associated with an increased risk of morbidity and mortality. Please activate Code PE by paging [phone] Critical Value/emergent results were called by telephone at the time of interpretation on 04/13/2018 at 2:22 pm to Dr. JResa Miner who verbally acknowledged these results. Electronically Signed   By: DMarin OlpM.D.   On: 04/13/2018 14:23   Vas UKoreaLower Extremity Venous (dvt) (only Mc & Wl 7a-7p)  Result Date: 04/13/2018  Lower Venous Study Risk Factors: Confirmed PE. Comparison Study: No prior study on file for comparison Performing Technologist: CSharion DoveRVS  Examination Guidelines: A complete evaluation includes B-mode imaging, spectral Doppler, color Doppler, and power Doppler as needed of all accessible portions of each vessel. Bilateral testing is considered an integral part of a complete examination. Limited examinations for reoccurring indications may be performed as noted.  Right Venous Findings: +---+---------------+---------+-----------+----------+-------+     CompressibilityPhasicitySpontaneityPropertiesSummary +---+---------------+---------+-----------+----------+-------+ CFVFull           Yes      Yes                          +---+---------------+---------+-----------+----------+-------+  Left Venous Findings: +---------+---------------+---------+-----------+----------+-----------------+          CompressibilityPhasicitySpontaneityPropertiesSummary           +---------+---------------+---------+-----------+----------+-----------------+ CFV      Full           Yes      Yes                                    +---------+---------------+---------+-----------+----------+-----------------+  SFJ      Full                                                           +---------+---------------+---------+-----------+----------+-----------------+ FV Prox  Full                                                           +---------+---------------+---------+-----------+----------+-----------------+ FV Mid   Full                                                           +---------+---------------+---------+-----------+----------+-----------------+ FV DistalFull                                                           +---------+---------------+---------+-----------+----------+-----------------+ PFV      Full                                                           +---------+---------------+---------+-----------+----------+-----------------+ POP      None           No       No                   Age Indeterminate +---------+---------------+---------+-----------+----------+-----------------+ PTV      None           No       No                   Age Indeterminate +---------+---------------+---------+-----------+----------+-----------------+ PERO     Full                                                           +---------+---------------+---------+-----------+----------+-----------------+    Summary: Right:  No evidence of common femoral vein obstruction. Left: Findings consistent with age indeterminate deep vein thrombosis involving the left popliteal vein, and left posterior tibial vein.  *See table(s) above for measurements and observations.    Preliminary     Benay Pike, MD 04/13/2018, 3:41 PM PGY-1, [facility] pager: [phone], text pages welcome  FPTS Upper-Level Resident Addendum   I have independently interviewed and examined the patient. I have discussed the above with the original author and agree with their documentation. My edits for correction/addition/clarification are in green. Please see also any attending notes.    Rory Percy, DO PGY-2, [facility] 04/13/2018 6:50 PM  [facility] pager: [phone] (text pages welcome through [facility])

## 2018-04-13 NOTE — ED Triage Notes (Signed)
Patient presents with left leg pain for a couple of weeks and shortness of breath X3 days. No fever, chills, or body aches. She feels tightness in the center of her chest.

## 2018-04-13 NOTE — ED Provider Notes (Signed)
[facility] EMERGENCY DEPARTMENT Provider Note   CSN: [id number] Arrival date & time: 04/13/18  1118    History   Chief Complaint Chief Complaint  Patient presents with   Shortness of Breath   Leg Pain    HPI Whitney [name] is a 33 y.o. female with a PMH of Diabetes Mellitus presenting with constant chest pain onset 2-3 days ago. Patient describes chest pain as tightness and nothing makes it better or worse. Patient reports constant shortness of breath onset 2 days ago. Patient reports left leg pain onset 2 weeks ago. Patient describes pain as a constant burning and states nothing makes it better or worse. Patient was evaluated by urgent care today and advised to come to the ER. Patient denies fever, chills, cough, congestion, rhinorrhea, body aches, nausea, vomiting, or abdominal pain. Patient reports sick contacts with influenza at work, but denies sick contacts. Patient denies COVID exposure. Patient reports she has a nexplanon inserted in her right arm. Patient denies any recent surgeries or history of DVT/PE. Patient reports chronic leg edema bilaterally. Patient denies a personal or family history of heart problems. Patient reports aunt and grandmother had a DVT in the past, but patient denies a history of blood disorders. Patient reports she quit smoking 8 years ago and denies alcohol or drug use.    HPI  Past Medical History:  Diagnosis Date   Diabetes mellitus without complication ([place])    Gallstones    Headache(784.0)    No pertinent past medical history     Patient Active Problem List   Diagnosis Date Noted   Numbness and tingling of hand 03/01/2016   Type 2 diabetes mellitus ([place]) 01/17/2016   Routine general medical examination at a health care facility 12/27/2015   Morbid obesity ([place]) 12/27/2015   Gallstone pancreatitis 03/11/2012   Abdominal pain, acute 03/11/2012   Nausea & vomiting 03/11/2012   Postpartum state 03/11/2012    Trichimoniasis 01/27/2012   Group B streptococcal infection in pregnancy 01/27/2012   Postpartum care following vaginal delivery (1/4) 01/27/2012   SVD (spontaneous vaginal delivery) 01/27/2012    Past Surgical History:  Procedure Laterality Date   CHOLECYSTECTOMY N/A 03/12/2012   Procedure: LAPAROSCOPIC CHOLECYSTECTOMY;  Surgeon: Harl Bowie, MD;  Location: [place];  Service: General;  Laterality: N/A;     OB History    Gravida  1   Para  1   Term  1   Preterm  0   AB  0   Living  1     SAB  0   TAB  0   Ectopic  0   Multiple  0   Live Births  1            Home Medications    Prior to Admission medications   Medication Sig Start Date End Date Taking? Authorizing Provider  Albuterol Sulfate (PROAIR RESPICLICK) 211 (90 Base) MCG/ACT AEPB Inhale 1-2 puffs into the lungs 4 (four) times daily as needed. 12/11/17   Loura Halt A, NP  Biotin 1 MG CAPS Take by mouth.    [provider]  Blood Glucose Monitoring Suppl (ONE TOUCH ULTRA MINI) w/Device KIT Use meter to check blood sugars 1-4 times daily as instructed. 01/28/16   Golden Circle, FNP  Cholecalciferol (VITAMIN D3) 50000 units TABS Take 50,000 Units by mouth once a week. 12/28/15   Golden Circle, FNP  Dulaglutide (TRULICITY) 1.55 MC/8.0EM SOPN Inject 0.75 mg into the skin  once a week. 02/22/16   Golden Circle, FNP  furosemide (LASIX) 20 MG tablet Take 1 tablet (20 mg total) by mouth daily. 08/29/16   Vanessa Kick, MD  glucose blood (ONE TOUCH ULTRA TEST) test strip Use one strip per test. Test blood sugars 1-4 times daily as instructed. 01/28/16   Golden Circle, FNP  guaiFENesin (MUCINEX) 600 MG 12 hr tablet Take 1 tablet (600 mg total) by mouth 2 (two) times daily. 12/11/17   Loura Halt A, NP  Lancets Misc. (ONE TOUCH SURESOFT) MISC Use 1 lancet per test. Test blood sugars 1-4 times per day as instructed. 01/31/16   Golden Circle, FNP  Prenatal Vit-Fe Fumarate-FA (PRENATAL  MULTIVITAMIN) TABS Take 1 tablet by mouth daily.    [provider]    Family History Family History  Problem Relation Age of Onset   Healthy Mother    Ulcerative colitis Father    Diabetes Maternal Grandmother    Breast cancer Maternal Grandmother    Syncope episode Maternal Grandfather    Benign prostatic hyperplasia Maternal Grandfather    Brain cancer Paternal Grandmother    Lung cancer Paternal Grandfather    Anesthesia problems Neg Hx    Hypotension Neg Hx    Malignant hyperthermia Neg Hx    Pseudochol deficiency Neg Hx     Social History Social History   Tobacco Use   Smoking status: Former Smoker    Types: Cigarettes    Last attempt to quit: 05/24/2011    Years since quitting: 6.8   Smokeless tobacco: Never Used  Substance Use Topics   Alcohol use: Yes    Comment: Once a month on average   Drug use: No     Allergies   Patient has no known allergies.   Review of Systems Review of Systems  Constitutional: Negative for chills, diaphoresis, fatigue, fever and unexpected weight change.  HENT: Negative for congestion, rhinorrhea, sore throat and trouble swallowing.   Eyes: Negative for visual disturbance.  Respiratory: Positive for chest tightness and shortness of breath. Negative for cough, wheezing and stridor.   Cardiovascular: Positive for chest pain and leg swelling. Negative for palpitations.  Gastrointestinal: Negative for abdominal pain, nausea and vomiting.  Endocrine: Negative for cold intolerance and heat intolerance.  Musculoskeletal: Positive for arthralgias. Negative for gait problem and myalgias.  Skin: Negative for pallor and rash.  Allergic/Immunologic: Negative for environmental allergies and food allergies.  Neurological: Negative for dizziness, syncope, speech difficulty, weakness, light-headedness and numbness.  Psychiatric/Behavioral: The patient is not nervous/anxious.     Physical Exam Updated Vital Signs BP  (!) 159/104    Pulse 100    Temp 100.2 F (37.9 C) (Oral)    Resp 19    Ht '5\' 11"'  (1.803 m)    Wt (!) 163.3 kg    SpO2 95%    BMI 50.21 kg/m   Physical Exam Vitals signs and nursing note reviewed.  Constitutional:      General: She is not in acute distress.    Appearance: She is well-developed. She is obese. She is not diaphoretic.  HENT:     Head: Normocephalic and atraumatic.     Right Ear: Tympanic membrane, ear canal and external ear normal.     Left Ear: Tympanic membrane, ear canal and external ear normal.     Nose: Mucosal edema and congestion present. No rhinorrhea.     Mouth/Throat:     Mouth: Mucous membranes are moist.  Pharynx: Oropharynx is clear. Uvula midline. No oropharyngeal exudate.  Eyes:     Extraocular Movements: Extraocular movements intact.     Pupils: Pupils are equal, round, and reactive to light.  Neck:     Musculoskeletal: Normal range of motion and neck supple.     Vascular: No JVD.  Cardiovascular:     Rate and Rhythm: Regular rhythm. Tachycardia present.     Pulses: Normal pulses.          Radial pulses are 2+ on the right side and 2+ on the left side.       Dorsalis pedis pulses are 2+ on the right side and 2+ on the left side.     Heart sounds: Normal heart sounds. No murmur. No friction rub. No gallop.   Pulmonary:     Effort: Pulmonary effort is normal. No respiratory distress.     Breath sounds: Decreased breath sounds present. No wheezing or rales.  Chest:     Chest wall: No tenderness.  Abdominal:     Palpations: Abdomen is soft.     Tenderness: There is no abdominal tenderness.  Musculoskeletal: Normal range of motion.     Right lower leg: She exhibits no tenderness. Edema present.     Left lower leg: She exhibits no tenderness. Edema present.  Skin:    General: Skin is warm.     Capillary Refill: Capillary refill takes less than 2 seconds.     Coloration: Skin is not pale.     Findings: No rash or wound.  Neurological:     Mental  Status: She is alert and oriented to person, place, and time.     Sensory: Sensation is intact.     Motor: No weakness.     Gait: Gait is intact.     Deep Tendon Reflexes:     Reflex Scores:      Patellar reflexes are 2+ on the right side and 2+ on the left side.      Achilles reflexes are 2+ on the right side and 2+ on the left side.    ED Treatments / Results  Labs (all labs ordered are listed, but only abnormal results are displayed) Labs Reviewed  CBC - Abnormal; Notable for the following components:      Result Value   WBC 10.9 (*)    RBC 5.38 (*)    All other components within normal limits  D-DIMER, QUANTITATIVE (NOT AT [facility]) - Abnormal; Notable for the following components:   D-Dimer, Quant 8.06 (*)    All other components within normal limits  COMPREHENSIVE METABOLIC PANEL - Abnormal; Notable for the following components:   Glucose, Bld 199 (*)    BUN <5 (*)    All other components within normal limits  I-STAT TROPONIN, ED  I-STAT BETA HCG BLOOD, ED ([facility], [facility], [facility] ONLY)    EKG EKG Interpretation  Date/Time:  Saturday April 13 2018 11:31:10 EDT Ventricular Rate:  111 PR Interval:    QRS Duration: 77 QT Interval:  429 QTC Calculation: 584 R Axis:   -11 Text Interpretation:  Sinus tachycardia Inferior infarct, age indeterminate Probable anterior infarct, age indeterminate Prolonged QT interval Baseline wander in lead(s) II aVF Confirmed by Isla Pence [id number]) on 04/13/2018 11:41:44 AM   Radiology Dg Chest 2 View  Result Date: 04/13/2018 CLINICAL DATA:  Shortness of breath and chest tightness today. EXAM: CHEST - 2 VIEW COMPARISON:  PA and lateral chest 12/11/2017. FINDINGS: Lungs clear. Heart size normal.  No pneumothorax or pleural fluid. No acute or focal bony abnormality. IMPRESSION: Negative chest. Electronically Signed   By: Inge Rise M.D.   On: 04/13/2018 12:30   Ct Angio Chest Pe W/cm &/or Wo Cm  Result Date: 04/13/2018 CLINICAL DATA:  Left leg  pain a couple of weeks. Some shortness of breath over the past 3 days. EXAM: CT ANGIOGRAPHY CHEST WITH CONTRAST TECHNIQUE: Multidetector CT imaging of the chest was performed using the standard protocol during bolus administration of intravenous contrast. Multiplanar CT image reconstructions and MIPs were obtained to evaluate the vascular anatomy. CONTRAST:  111m OMNIPAQUE IOHEXOL 350 MG/ML SOLN COMPARISON:  Chest x-ray today. FINDINGS: Cardiovascular: Heart size is normal. Thoracic aorta is normal. Pulmonary arterial system is well opacified demonstrates moderate emboli over the distal main right pulmonary artery extending into the proximal upper, middle and lower lobar arteries. There are several small more peripheral left-sided pulmonary emboli. There is evidence of right heart strain with RV/LV ratio of 51.8/44.4 equals 1.16. Remaining vascular structures are unremarkable. Mediastinum/Nodes: No significant mediastinal or hilar adenopathy. Remaining mediastinal structures are unremarkable. Lungs/Pleura: Lungs are well inflated without focal airspace consolidation or effusion. Very subtle hazy left perihilar attenuation likely mild vascular congestion. 2 mm nodular density over the medial right upper lobe. 5 mm nodular density over the left lower lobe. Airways are normal. Upper Abdomen: No acute findings. Musculoskeletal: Unremarkable. Review of the MIP images confirms the above findings. IMPRESSION: Moderate right-sided pulmonary emboli as described with small more peripheral left pulmonary emboli. Positive for acute PE with CT evidence of right heart strain (RV/LV Ratio = 1.16) consistent with at least submassive (intermediate risk) PE. The presence of right heart strain has been associated with an increased risk of morbidity and mortality. Please activate Code PE by paging [phone] Critical Value/emergent results were called by telephone at the time of interpretation on 04/13/2018 at 2:22 pm to Dr. JResa Miner who verbally acknowledged these results. Electronically Signed   By: DMarin OlpM.D.   On: 04/13/2018 14:23    Procedures Procedures (including critical care time)  Medications Ordered in ED Medications  acetaminophen (TYLENOL) tablet 650 mg (has no administration in time range)  aspirin chewable tablet 324 mg (324 mg Oral Given 04/13/18 1144)  iohexol (OMNIPAQUE) 350 MG/ML injection 75 mL (100 mLs Intravenous Contrast Given 04/13/18 1339)     Initial Impression / Assessment and Plan / ED Course  I have reviewed the triage vital signs and the nursing notes.  Pertinent labs & imaging results that were available during my care of the patient were reviewed by me and considered in my medical decision making (see chart for details).  Clinical Course as of Apr 12 1433  Sat Apr 13, 2018  1233 Negative CXR for acute cardiopulmonary disease.  DG Chest 2 View [AH]  1241 Leukocytosis noted at 10.9.   WBC(!): 10.9 [AH]  1421 D dimer elevated at 8.06. Will order CTA.  D-Dimer, Quant(!): 8.06 [AH]  [id number] reveals moderate right-sided pulmonary emboli as described with small more peripheral left pulmonary emboli. Positive for acute PE with CT evidence of right heart strain (RV/LV Ratio = 1.16) consistent with at least submassive (intermediate risk) PE.    CT Angio Chest PE W/Cm &/Or Wo Cm [AH]    Clinical Course User Index [AH] HArville Lime PA-C      Patient presents with shortness of breath, chest pain, and leg pain. EKG reveals sinus tachycardia, first troponin is negative,  and CXR is negative. D dimer was elevated and CTA was ordered. Patient has been diagnosed with bilateral PEs via CTA. Left leg ultrasound also reveals DVT. Ordered heparin.  Patient will need admission. Consulted hospitalist. Hospitalist has agreed to admit patient.   Findings and plan of care discussed with supervising physician Dr. Gilford Raid.  CRITICAL CARE Performed by: [place]  Total  critical care time: 40 minutes  Critical care time was exclusive of separately billable procedures and treating other patients.  Critical care was necessary to treat or prevent imminent or life-threatening deterioration.  Critical care was time spent personally by me on the following activities: development of treatment plan with patient and/or surrogate as well as nursing, discussions with consultants, evaluation of patient's response to treatment, examination of patient, obtaining history from patient or surrogate, ordering and performing treatments and interventions, ordering and review of laboratory studies, ordering and review of radiographic studies, pulse oximetry and re-evaluation of patient's condition.  Final Clinical Impressions(s) / ED Diagnoses   Final diagnoses:  Chest pain in adult  Shortness of breath  Acute pulmonary embolism, unspecified pulmonary embolism type, unspecified whether acute cor pulmonale present [facility])  Left leg pain    ED Discharge Orders    None       Arville Lime, [place] 04/13/18 [place], Julie, MD 04/14/18 [phone]

## 2018-04-13 NOTE — ED Notes (Signed)
Pt back from X-ray.  

## 2018-04-13 NOTE — ED Notes (Signed)
Per lab request, sent a requisition and [employer] label for add-on of APTT and Protime-INR labs.

## 2018-04-13 NOTE — ED Notes (Signed)
Patient transported to CT 

## 2018-04-13 NOTE — Progress Notes (Signed)
ANTICOAGULATION CONSULT NOTE - Initial Consult  Pharmacy Consult:  Heparin Indication:  Acute PE  No Known Allergies  Patient Measurements: Height: 5\' 11"  (180.3 cm) Weight: (!) 360 lb (163.3 kg) IBW/kg (Calculated) : 70.8 Heparin Dosing Weight: 111 kg  Vital Signs: Temp: 100.2 F (37.9 C) (03/21 1136) Temp Source: Oral (03/21 1136) BP: 159/104 (03/21 1415) Pulse Rate: 100 (03/21 1415)  Labs: Recent Labs    04/13/18 1204  HGB 14.2  HCT 43.6  PLT 185  CREATININE 0.91    Estimated Creatinine Clearance: 149.6 mL/min (by C-G formula based on SCr of 0.91 mg/dL).   Medical History: Past Medical History:  Diagnosis Date  . Diabetes mellitus without complication ([place])   . Gallstones   . Headache(784.0)   . No pertinent past medical history      Assessment: Whitney [name] presented with leg pain and SOB.  D-dimer is elevated and CTA shows at least submassive PE with RHS.  Pharmacy consulted to initiate IV heparin.  Baseline labs reviewed.  She is not on a blood thinner at home and weight in EPIC is correct per RN.  Goal of Therapy:  Heparin level 0.3-0.7 units/ml Monitor platelets by anticoagulation protocol: Yes   Plan:  Heparin 6500 units IV bolus, then Heparin gtt at 1800 units/hr Check 6 hr heparin level Daily heparin level and CBC  Dajanay Northrup D. Mina Marble, PharmD, BCPS, [place] 04/13/2018, 2:56 PM

## 2018-04-13 NOTE — Progress Notes (Signed)
On call MD made aware of elevated BP. No new orders. Will continue to monitor BPs.

## 2018-04-13 NOTE — Progress Notes (Signed)
ANTICOAGULATION CONSULT NOTE   Pharmacy Consult:  Heparin Indication:  Acute PE  No Known Allergies  Patient Measurements: Height: 5\' 11"  (180.3 cm) Weight: (!) 360 lb (163.3 kg) IBW/kg (Calculated) : 70.8 Heparin Dosing Weight: 111 kg  Vital Signs: Temp: 98.1 F (36.7 C) (03/21 1937) Temp Source: Oral (03/21 1937) BP: 162/120 (03/21 2040) Pulse Rate: 101 (03/21 2040)  Labs: Recent Labs    04/13/18 1204 04/13/18 1214 04/13/18 2146  HGB 14.2  --   --   HCT 43.6  --   --   PLT 185  --   --   APTT  --  25  --   LABPROT  --  14.4  --   INR  --  1.1  --   HEPARINUNFRC  --   --  0.18*  CREATININE 0.91  --   --     Estimated Creatinine Clearance: 149.6 mL/min (by C-G formula based on SCr of 0.91 mg/dL).   Medical History: Past Medical History:  Diagnosis Date  . Diabetes mellitus without complication ([place])   . Gallstones   . Headache(784.0)   . No pertinent past medical history      Assessment: 21 YOF presented with leg pain and SOB.  D-dimer is elevated and CTA shows at least submassive PE with RHS.  Pharmacy consulted to initiate IV heparin.  Initial heparin level 0.18 units/ml  Goal of Therapy:  Heparin level 0.3-0.7 units/ml Monitor platelets by anticoagulation protocol: Yes   Plan:  Heparin 3000 units IV bolus, then Increase heparin gtt to 2200 units/hr Daily heparin level and CBC  Thanks for allowing pharmacy to be a part of this patient's care.  Excell Seltzer, PharmD Clinical Pharmacist  04/13/2018, 10:58 PM

## 2018-04-13 NOTE — Progress Notes (Addendum)
Family Medicine Teaching Service Daily Progress Note Intern Pager: [phone]  Patient name: Whitney [name] Medical record number: [id number] Date of birth: 01-10-1986 Age: 33 y.o. Gender: female  Primary Care Provider: Patient, No Pcp Per Consultants: none Code Status: Full  Pt Overview and Major Events to Date:  Admitted 3/21 for PE with DVT  Assessment and Plan: Whitney [name] is a 33 y.o. female presenting with dyspnea on exertion, left leg pain concerning for pulmonary embolus secondary to left lower extremity DVT. PMH is significant for morbid obesity, type 2 diabetes.  Provoked submassive bilateral PE 2/2 proximal LLE DVT. In the setting of recent long car ride but with other minor persistent risk factors including morbid obesity, sedentary lifestyle, family history 3+ family members with DVT. Will need at least 3 months of anticoagulation. She does have a nexplanon which per CDC [place] medical eligibility criteria the advantages generally outweigh theoretical or proven risks in acute DVT/PE. Regardless her nexplanon is scheduled to be taken out outpatient this week. - Heparin GTT, transition to xarelto as this can be crushed - monitor on telemetry - EKG this morning pending - Follow-up echocardiogram - Tylenol for pain - consider   HTN. BPs persistently elevated to 156/115. May be secondary to pain and PE.  - monitor BPs, if continues to be elevated would consider starting HCTZ as this can be crushed  Type 2 Diabetes. A1c elevated at 8.5 likely in the setting of medication noncompliance has patient was unable to afford her home trulicity - Sensitive SSI - Follow-up A1c - Monitor blood glucose - CM consulted for medication assistance - start metformin 500mg  BID as can be crushed  FEN/GI: Heart healthy/carb modified diet Prophylaxis: heparin gtt transitioning to xarelto  Disposition: pending medical management   Subjective:  Feels well overall this morning. Continues to have leg  pain that is worse with ambulation. No CP or SOB. States that can handle medications if crushed.  Objective: Temp:  [98.1 F (36.7 C)-100.2 F (37.9 C)] 98.3 F (36.8 C) (03/22 0042) Pulse Rate:  [95-113] 99 (03/22 0042) Resp:  [13-31] 18 (03/22 0042) BP: (151-192)/(100-128) 156/115 (03/22 0042) SpO2:  [91 %-98 %] 98 % (03/22 0042) Weight:  [163.3 kg] 163.3 kg (03/21 1138) Physical Exam: General: sitting up in bed, in NAD Cardiovascular: RRR, no murmurs Respiratory: CTAB, NWOB on RA Abdomen: soft, nontender, nondistended, obese, + bowel sounds Extremities: WWP. L leg TTP without erythema or edema or warmth.   Laboratory: Recent Labs  Lab 04/13/18 1204 04/14/18 0557  WBC 10.9* 10.7*  HGB 14.2 13.8  HCT 43.6 42.0  PLT 185 198   Recent Labs  Lab 04/13/18 1204  NA 138  K 3.7  CL 107  CO2 23  BUN <5*  CREATININE 0.91  CALCIUM 10.0  PROT 7.4  BILITOT 0.9  ALKPHOS 77  ALT 39  AST 24  GLUCOSE 199*     Imaging/Diagnostic Tests: Dg Chest 2 View  Result Date: 04/13/2018 CLINICAL DATA:  Shortness of breath and chest tightness today. EXAM: CHEST - 2 VIEW COMPARISON:  PA and lateral chest 12/11/2017. FINDINGS: Lungs clear. Heart size normal. No pneumothorax or pleural fluid. No acute or focal bony abnormality. IMPRESSION: Negative chest. Electronically Signed   By: Inge Rise M.D.   On: 04/13/2018 12:30   Ct Angio Chest Pe W/cm &/or Wo Cm  Result Date: 04/13/2018 CLINICAL DATA:  Left leg pain a couple of weeks. Some shortness of breath over the past 3 days.  EXAM: CT ANGIOGRAPHY CHEST WITH CONTRAST TECHNIQUE: Multidetector CT imaging of the chest was performed using the standard protocol during bolus administration of intravenous contrast. Multiplanar CT image reconstructions and MIPs were obtained to evaluate the vascular anatomy. CONTRAST:  175mL OMNIPAQUE IOHEXOL 350 MG/ML SOLN COMPARISON:  Chest x-ray today. FINDINGS: Cardiovascular: Heart size is normal. Thoracic  aorta is normal. Pulmonary arterial system is well opacified demonstrates moderate emboli over the distal main right pulmonary artery extending into the proximal upper, middle and lower lobar arteries. There are several small more peripheral left-sided pulmonary emboli. There is evidence of right heart strain with RV/LV ratio of 51.8/44.4 equals 1.16. Remaining vascular structures are unremarkable. Mediastinum/Nodes: No significant mediastinal or hilar adenopathy. Remaining mediastinal structures are unremarkable. Lungs/Pleura: Lungs are well inflated without focal airspace consolidation or effusion. Very subtle hazy left perihilar attenuation likely mild vascular congestion. 2 mm nodular density over the medial right upper lobe. 5 mm nodular density over the left lower lobe. Airways are normal. Upper Abdomen: No acute findings. Musculoskeletal: Unremarkable. Review of the MIP images confirms the above findings. IMPRESSION: Moderate right-sided pulmonary emboli as described with small more peripheral left pulmonary emboli. Positive for acute PE with CT evidence of right heart strain (RV/LV Ratio = 1.16) consistent with at least submassive (intermediate risk) PE. The presence of right heart strain has been associated with an increased risk of morbidity and mortality. Please activate Code PE by paging [phone]. Critical Value/emergent results were called by telephone at the time of interpretation on 04/13/2018 at 2:22 pm to Dr. Resa Miner, who verbally acknowledged these results. Electronically Signed   By: Marin Olp M.D.   On: 04/13/2018 14:23   Vas [place] Lower Extremity Venous (dvt) (only Mc & Wl 7a-7p)  Result Date: 04/13/2018  Lower Venous Study Risk Factors: Confirmed PE. Comparison Study: No prior study on file for comparison Performing Technologist: Sharion Dove RVS  Examination Guidelines: A complete evaluation includes B-mode imaging, spectral Doppler, color Doppler, and power Doppler as needed  of all accessible portions of each vessel. Bilateral testing is considered an integral part of a complete examination. Limited examinations for reoccurring indications may be performed as noted.  Right Venous Findings: +---+---------------+---------+-----------+----------+-------+    CompressibilityPhasicitySpontaneityPropertiesSummary +---+---------------+---------+-----------+----------+-------+ CFVFull           Yes      Yes                          +---+---------------+---------+-----------+----------+-------+  Left Venous Findings: +---------+---------------+---------+-----------+----------+-----------------+          CompressibilityPhasicitySpontaneityPropertiesSummary           +---------+---------------+---------+-----------+----------+-----------------+ CFV      Full           Yes      Yes                                    +---------+---------------+---------+-----------+----------+-----------------+ SFJ      Full                                                           +---------+---------------+---------+-----------+----------+-----------------+ FV Prox  Full                                                           +---------+---------------+---------+-----------+----------+-----------------+  FV Mid   Full                                                           +---------+---------------+---------+-----------+----------+-----------------+ FV DistalFull                                                           +---------+---------------+---------+-----------+----------+-----------------+ PFV      Full                                                           +---------+---------------+---------+-----------+----------+-----------------+ POP      None           No       No                   Age Indeterminate +---------+---------------+---------+-----------+----------+-----------------+ PTV      None           No       No                    Age Indeterminate +---------+---------------+---------+-----------+----------+-----------------+ PERO     Full                                                           +---------+---------------+---------+-----------+----------+-----------------+    Summary: Right: No evidence of common femoral vein obstruction. Left: Findings consistent with age indeterminate deep vein thrombosis involving the left popliteal vein, and left posterior tibial vein.  *See table(s) above for measurements and observations.    Preliminary     Bufford Lope, DO 04/14/2018, 6:26 AM PGY-3, [place] Intern pager: [phone], text pages welcome

## 2018-04-13 NOTE — ED Notes (Signed)
ED TO INPATIENT HANDOFF REPORT  ED Nurse Name and Phone #:  Petra Kuba [phone]  S Name/Age/Gender Whitney [name] 33 y.o. female Room/Bed: 045C/045C  Code Status   Code Status: Prior  Home/SNF/Other Home Patient oriented to: self, place, time and situation Is this baseline? Yes   Triage Complete: Triage complete  Chief Complaint blot clots in leg/sob  Triage Note Patient presents with left leg pain for a couple of weeks and shortness of breath X3 days. No fever, chills, or body aches. She feels tightness in the center of her chest.   Allergies No Known Allergies  Level of Care/Admitting Diagnosis ED Disposition    ED Disposition Condition [facility] Area: [place] [[id number]]  Level of Care: Telemetry Cardiac [103]  Diagnosis: Pulmonary emboli [facility]) [[id number]]  Admitting Physician: Rory Percy [[id number]]  Attending Physician: Zenia Resides [[id number]]  Estimated length of stay: past midnight tomorrow  Certification:: I certify this patient will need inpatient services for at least 2 midnights  PT Class (Do Not Modify): Inpatient [101]  PT Acc Code (Do Not Modify): Private [1]       B Medical/Surgery History Past Medical History:  Diagnosis Date  . Diabetes mellitus without complication ([place])   . Gallstones   . Headache(784.0)   . No pertinent past medical history    Past Surgical History:  Procedure Laterality Date  . CHOLECYSTECTOMY N/A 03/12/2012   Procedure: LAPAROSCOPIC CHOLECYSTECTOMY;  Surgeon: Harl Bowie, MD;  Location: [place];  Service: General;  Laterality: N/A;     A IV Location/Drains/Wounds Patient Lines/Drains/Airways Status   Active Line/Drains/Airways    Name:   Placement date:   Placement time:   Site:   Days:   Peripheral IV 04/13/18 Right Antecubital   04/13/18    1209    Antecubital   less than 1   Incision 03/12/12 Abdomen Other (Comment)   03/12/12    1109     2223   Incision - 4 Ports Abdomen 1:  Umbilicus 2: Upper;Mid 3: Mid;Lower 4: Right;Medial   03/12/12    1050     2223          Intake/Output Last 24 hours No intake or output data in the 24 hours ending 04/13/18 1623  Labs/Imaging Results for orders placed or performed during the hospital encounter of 04/13/18 (from the past 48 hour(s))  CBC     Status: Abnormal   Collection Time: 04/13/18 12:04 PM  Result Value Ref Range   WBC 10.9 (H) 4.0 - 10.5 K/uL   RBC 5.38 (H) 3.87 - 5.11 MIL/uL   Hemoglobin 14.2 12.0 - 15.0 g/dL   HCT 43.6 36.0 - 46.0 %   MCV 81.0 80.0 - 100.0 fL   MCH 26.4 26.0 - 34.0 pg   MCHC 32.6 30.0 - 36.0 g/dL   RDW 12.3 11.5 - 15.5 %   Platelets 185 150 - 400 K/uL   nRBC 0.0 0.0 - 0.2 %    Comment: Performed at [facility], [place] [address], [place], [place] [address]  D-dimer, quantitative     Status: Abnormal   Collection Time: 04/13/18 12:04 PM  Result Value Ref Range   D-Dimer, Quant 8.06 (H) 0.00 - 0.50 ug/mL-FEU    Comment: (NOTE) At the manufacturer cut-off of 0.50 ug/mL FEU, this assay has been documented to exclude PE with a sensitivity and negative predictive value of 97 to 99%.  At this time,  this assay has not been approved by the FDA to exclude DVT/VTE. Results should be correlated with clinical presentation. Performed at [facility], [place] [address] [place], [place] [address]   Comprehensive metabolic panel     Status: Abnormal   Collection Time: 04/13/18 12:04 PM  Result Value Ref Range   Sodium 138 135 - 145 mmol/L   Potassium 3.7 3.5 - 5.1 mmol/L   Chloride 107 98 - 111 mmol/L   CO2 23 22 - 32 mmol/L   Glucose, Bld 199 (H) 70 - 99 mg/dL   BUN <5 (L) 6 - 20 mg/dL   Creatinine, Ser 0.91 0.44 - 1.00 mg/dL   Calcium 10.0 8.9 - 10.3 mg/dL   Total Protein 7.4 6.5 - 8.1 g/dL   Albumin 3.7 3.5 - 5.0 g/dL   AST 24 15 - 41 U/L   ALT 39 0 - 44 U/L   Alkaline Phosphatase 77 38 - 126 U/L   Total Bilirubin 0.9 0.3 - 1.2 mg/dL   GFR calc non Af Amer >60 >60 mL/min    GFR calc Af Amer >60 >60 mL/min   Anion gap 8 5 - 15    Comment: Performed at [facility] [address], [place], [place] [address]  I-Stat beta hCG blood, ED     Status: None   Collection Time: 04/13/18 12:09 PM  Result Value Ref Range   I-stat hCG, quantitative <5.0 <5 mIU/mL   Comment 3            Comment:   GEST. AGE      CONC.  (mIU/mL)   <=1 WEEK        5 - 50     2 WEEKS       50 - 500     3 WEEKS       100 - 10,000     4 WEEKS     1,000 - 30,000        FEMALE AND NON-PREGNANT FEMALE:     LESS THAN 5 mIU/mL   I-stat troponin, ED     Status: None   Collection Time: 04/13/18 12:10 PM  Result Value Ref Range   Troponin i, poc 0.03 0.00 - 0.08 ng/mL   Comment 3            Comment: Due to the release kinetics of cTnI, a negative result within the first hours of the onset of symptoms does not rule out myocardial infarction with certainty. If myocardial infarction is still suspected, repeat the test at appropriate intervals.    Dg Chest 2 View  Result Date: 04/13/2018 CLINICAL DATA:  Shortness of breath and chest tightness today. EXAM: CHEST - 2 VIEW COMPARISON:  PA and lateral chest 12/11/2017. FINDINGS: Lungs clear. Heart size normal. No pneumothorax or pleural fluid. No acute or focal bony abnormality. IMPRESSION: Negative chest. Electronically Signed   By: Inge Rise M.D.   On: 04/13/2018 12:30   Ct Angio Chest Pe W/cm &/or Wo Cm  Result Date: 04/13/2018 CLINICAL DATA:  Left leg pain a couple of weeks. Some shortness of breath over the past 3 days. EXAM: CT ANGIOGRAPHY CHEST WITH CONTRAST TECHNIQUE: Multidetector CT imaging of the chest was performed using the standard protocol during bolus administration of intravenous contrast. Multiplanar CT image reconstructions and MIPs were obtained to evaluate the vascular anatomy. CONTRAST:  147mL OMNIPAQUE IOHEXOL 350 MG/ML SOLN COMPARISON:  Chest x-ray today. FINDINGS: Cardiovascular: Heart size is normal.  Thoracic aorta  is normal. Pulmonary arterial system is well opacified demonstrates moderate emboli over the distal main right pulmonary artery extending into the proximal upper, middle and lower lobar arteries. There are several small more peripheral left-sided pulmonary emboli. There is evidence of right heart strain with RV/LV ratio of 51.8/44.4 equals 1.16. Remaining vascular structures are unremarkable. Mediastinum/Nodes: No significant mediastinal or hilar adenopathy. Remaining mediastinal structures are unremarkable. Lungs/Pleura: Lungs are well inflated without focal airspace consolidation or effusion. Very subtle hazy left perihilar attenuation likely mild vascular congestion. 2 mm nodular density over the medial right upper lobe. 5 mm nodular density over the left lower lobe. Airways are normal. Upper Abdomen: No acute findings. Musculoskeletal: Unremarkable. Review of the MIP images confirms the above findings. IMPRESSION: Moderate right-sided pulmonary emboli as described with small more peripheral left pulmonary emboli. Positive for acute PE with CT evidence of right heart strain (RV/LV Ratio = 1.16) consistent with at least submassive (intermediate risk) PE. The presence of right heart strain has been associated with an increased risk of morbidity and mortality. Please activate Code PE by paging [phone]. Critical Value/emergent results were called by telephone at the time of interpretation on 04/13/2018 at 2:22 pm to Dr. Resa Miner, who verbally acknowledged these results. Electronically Signed   By: Marin Olp M.D.   On: 04/13/2018 14:23    Pending Labs Unresulted Labs (From admission, onward)    Start     Ordered   04/14/18 0500  Heparin level (unfractionated)  Daily,   R     04/13/18 1454   04/14/18 0500  CBC  Daily,   R     04/13/18 1454   04/13/18 2200  Heparin level (unfractionated)  Once-Timed,   R     04/13/18 1454   04/13/18 1529  Protime-INR  ONCE - STAT,   STAT     04/13/18 1528    04/13/18 1529  APTT  ONCE - STAT,   STAT     04/13/18 1528          Vitals/Pain Today's Vitals   04/13/18 1539 04/13/18 1539 04/13/18 1600 04/13/18 1615  BP: (!) 151/105  (!) 168/107 (!) 157/109  Pulse: (!) 104  (!) 104 100  Resp: 20  (!) 21 (!) 31  Temp:      TempSrc:      SpO2: 96%  93% 95%  Weight:      Height:      PainSc:  0-No pain      Isolation Precautions No active isolations  Medications Medications  heparin ADULT infusion 100 units/mL (25000 units/210mL sodium chloride 0.45%) (1,800 Units/hr Intravenous New Bag/Given 04/13/18 1536)  aspirin chewable tablet 324 mg (324 mg Oral Given 04/13/18 1144)  acetaminophen (TYLENOL) tablet 650 mg (650 mg Oral Given 04/13/18 1452)  iohexol (OMNIPAQUE) 350 MG/ML injection 75 mL (100 mLs Intravenous Contrast Given 04/13/18 1339)  heparin bolus via infusion 6,500 Units (6,500 Units Intravenous Bolus from Bag 04/13/18 1538)    Mobility walks Low fall risk   Focused Assessments Pulmonary Assessment Handoff:  Lung sounds: Bilateral Breath Sounds: Clear, Diminished L Breath Sounds: Clear, Diminished(lower lobe diminished ) R Breath Sounds: Clear, Diminished(lower lobe diminished) O2 Device: Room Air        R Recommendations: See Admitting Provider Note  Report given to:   Additional Notes:  Patient gets really short of breath during exertion.

## 2018-04-14 ENCOUNTER — Encounter (HOSPITAL_COMMUNITY): Payer: Self-pay | Admitting: *Deleted

## 2018-04-14 ENCOUNTER — Inpatient Hospital Stay (HOSPITAL_COMMUNITY): Payer: 59

## 2018-04-14 DIAGNOSIS — I2699 Other pulmonary embolism without acute cor pulmonale: Secondary | ICD-10-CM

## 2018-04-14 DIAGNOSIS — I82432 Acute embolism and thrombosis of left popliteal vein: Secondary | ICD-10-CM

## 2018-04-14 DIAGNOSIS — I10 Essential (primary) hypertension: Secondary | ICD-10-CM

## 2018-04-14 DIAGNOSIS — E119 Type 2 diabetes mellitus without complications: Secondary | ICD-10-CM

## 2018-04-14 LAB — BASIC METABOLIC PANEL
ANION GAP: 9 (ref 5–15)
BUN: <5 mg/dL — ABNORMAL LOW (ref 6–20)
CO2: 24 mmol/L (ref 22–32)
Calcium: 9.9 mg/dL (ref 8.9–10.3)
Chloride: 104 mmol/L (ref 98–111)
Creatinine, Ser: 0.83 mg/dL (ref 0.44–1.00)
GFR calc Af Amer: >60 mL/min (ref >60)
GFR calc non Af Amer: >60 mL/min (ref >60)
Glucose, Bld: 165 mg/dL — ABNORMAL HIGH (ref 70–99)
Potassium: 3.6 mmol/L (ref 3.5–5.1)
Sodium: 137 mmol/L (ref 135–145)

## 2018-04-14 LAB — HEMOGLOBIN A1C
HEMOGLOBIN A1C: 8.5 % — AB (ref 4.8–5.6)
MEAN PLASMA GLUCOSE: 197.25 mg/dL

## 2018-04-14 LAB — ECHOCARDIOGRAM LIMITED
Height: 71 in
Weight: 5760 oz

## 2018-04-14 LAB — HIV ANTIBODY (ROUTINE TESTING W REFLEX): HIV Screen 4th Generation wRfx: NONREACTIVE

## 2018-04-14 LAB — HEPARIN LEVEL (UNFRACTIONATED): Heparin Unfractionated: 0.42 IU/mL (ref 0.30–0.70)

## 2018-04-14 LAB — GLUCOSE, CAPILLARY
GLUCOSE-CAPILLARY: 206 mg/dL — AB (ref 70–99)
Glucose-Capillary: 165 mg/dL — ABNORMAL HIGH (ref 70–99)
Glucose-Capillary: 134 mg/dL — ABNORMAL HIGH (ref 70–99)
Glucose-Capillary: 152 mg/dL — ABNORMAL HIGH (ref 70–99)

## 2018-04-14 LAB — CBC
HCT: 42 % (ref 36.0–46.0)
Hemoglobin: 13.8 g/dL (ref 12.0–15.0)
MCH: 26.5 pg (ref 26.0–34.0)
MCHC: 32.9 g/dL (ref 30.0–36.0)
MCV: 80.6 fL (ref 80.0–100.0)
Platelets: 198 10*3/uL (ref 150–400)
RBC: 5.21 MIL/uL — ABNORMAL HIGH (ref 3.87–5.11)
RDW: 12.3 % (ref 11.5–15.5)
WBC: 10.7 10*3/uL — ABNORMAL HIGH (ref 4.0–10.5)
nRBC: 0 % (ref 0.0–0.2)

## 2018-04-14 LAB — TSH: TSH: 1.993 u[IU]/mL (ref 0.350–4.500)

## 2018-04-14 MED ORDER — RIVAROXABAN 20 MG PO TABS: 20.0000 mg | ORAL_TABLET | Freq: Every day | ORAL | Status: DC

## 2018-04-14 MED ORDER — RIVAROXABAN 15 MG PO TABS
15.0000 mg | ORAL_TABLET | Freq: Two times a day (BID) | ORAL | Status: DC
  Administered 2018-04-14 – 2018-04-15 (×3): 15 mg via ORAL
  Filled 2018-04-14 (×3): qty 1

## 2018-04-14 MED ORDER — METFORMIN HCL 500 MG PO TABS
500.0000 mg | ORAL_TABLET | Freq: Two times a day (BID) | ORAL | Status: DC
  Administered 2018-04-14 – 2018-04-15 (×3): 500 mg via ORAL
  Filled 2018-04-14 (×3): qty 1

## 2018-04-14 NOTE — Progress Notes (Signed)
ANTICOAGULATION CONSULT NOTE   Pharmacy Consult:  Xarelto Indication:  Acute PE and DVT  No Known Allergies  Patient Measurements: Height: 5\' 11"  (180.3 cm) Weight: (!) 360 lb (163.3 kg) IBW/kg (Calculated) : 70.8  Vital Signs: Temp: 98.3 F (36.8 C) (03/22 0042) Temp Source: Oral (03/22 0042) BP: 156/115 (03/22 0042) Pulse Rate: 99 (03/22 0042)  Labs: Recent Labs    04/13/18 1204 04/13/18 1214 04/13/18 2146 04/14/18 0557  HGB 14.2  --   --  13.8  HCT 43.6  --   --  42.0  PLT 185  --   --  198  APTT  --  25  --   --   LABPROT  --  14.4  --   --   INR  --  1.1  --   --   HEPARINUNFRC  --   --  0.18* 0.42  CREATININE 0.91  --   --  0.83    Estimated Creatinine Clearance: 164.1 mL/min (by C-G formula based on SCr of 0.83 mg/dL).   Medical History: Past Medical History:  Diagnosis Date  . Diabetes mellitus without complication ([place])   . Gallstones   . Headache(784.0)   . No pertinent past medical history     Assessment: 89 YOF presented with leg pain and SOB.  D-dimer is elevated. Positive for provoked acute PE and LLE DVT. Pharmacy consulted to transition IV heparin to Xarelto.  CBC stable and wnl  Goal of Therapy:  Monitor platelets by anticoagulation protocol: Yes   Plan:  Xarelto 15 mg BID (can be crushed) with food x 21 days, followed by 20 mg daily with food for a total of 3 months Stop IV heparin when the first dose of Xarelto is given  Thanks for allowing pharmacy to be a part of this patient's care.  Vertis Kelch, PharmD PGY1 Pharmacy Resident Phone [phone] 04/14/2018       7:43 AM

## 2018-04-14 NOTE — Progress Notes (Signed)
ANTICOAGULATION CONSULT NOTE   Pharmacy Consult:  Heparin Indication:  Acute PE and DVT  No Known Allergies  Patient Measurements: Height: 5\' 11"  (180.3 cm) Weight: (!) 360 lb (163.3 kg) IBW/kg (Calculated) : 70.8 Heparin Dosing Weight: 111 kg  Vital Signs: Temp: 98.3 F (36.8 C) (03/22 0042) Temp Source: Oral (03/22 0042) BP: 156/115 (03/22 0042) Pulse Rate: 99 (03/22 0042)  Labs: Recent Labs    04/13/18 1204 04/13/18 1214 04/13/18 2146 04/14/18 0557  HGB 14.2  --   --  13.8  HCT 43.6  --   --  42.0  PLT 185  --   --  198  APTT  --  25  --   --   LABPROT  --  14.4  --   --   INR  --  1.1  --   --   HEPARINUNFRC  --   --  0.18* 0.42  CREATININE 0.91  --   --  0.83    Estimated Creatinine Clearance: 164.1 mL/min (by C-G formula based on SCr of 0.83 mg/dL).   Medical History: Past Medical History:  Diagnosis Date  . Diabetes mellitus without complication ([place])   . Gallstones   . Headache(784.0)   . No pertinent past medical history      Assessment: 89 YOF presented with leg pain and SOB.  D-dimer is elevated and CTA shows at least submassive PE with RHS. Vascular ultrasound positive for DVT in the LLE. Pharmacy consulted to initiate IV heparin.  Heparin level therapeutic at 0.42. CBC stable and wnl  Goal of Therapy:  Heparin level 0.3-0.7 units/ml Monitor platelets by anticoagulation protocol: Yes   Plan:  Continue heparin IV at 2200 units/hr Daily heparin level and CBC Follow up transitioning to outpatient anticoagulation   Thanks for allowing pharmacy to be a part of this patient's care.  Vertis Kelch, PharmD PGY1 Pharmacy Resident Phone [phone] 04/14/2018       7:19 AM

## 2018-04-14 NOTE — TOC Initial Note (Signed)
Transition of Care [facility]) - Initial/Assessment Note    Patient Details  Name: Whitney [name] MRN: [id number] Date of Birth: 11-24-1985  Transition of [facility]) CM/SW Contact:    Carles Collet, RN Phone Number: 04/14/2018, 9:59 AM  Clinical Narrative:               Benefit check for DOACs pending, will post Monday. Patient ordered xaralto as inpatient, CM provided with 30 day free card and copay coupon card.       Expected Discharge Plan: Home/Self Care Barriers to Discharge: No Barriers Identified   Patient Goals and CMS Choice        Expected Discharge Plan and Services Expected Discharge Plan: Home/Self Care   Discharge Planning Services: Medication Assistance                              Prior Living Arrangements/Services                       Activities of Daily Living      Permission Sought/Granted                  Emotional Assessment              Admission diagnosis:  Shortness of breath [R06.02] Left leg pain [M79.605] Chest pain in adult [R07.9] Acute pulmonary embolism, unspecified pulmonary embolism type, unspecified whether acute cor pulmonale present ([place]) [I26.99] Patient Active Problem List   Diagnosis Date Noted  . Acute deep vein thrombosis (DVT) of popliteal vein of left lower extremity ([place])   . Essential hypertension   . Pulmonary emboli ([place]) 04/13/2018  . Numbness and tingling of hand 03/01/2016  . Type 2 diabetes mellitus ([place]) 01/17/2016  . Routine general medical examination at a health care facility 12/27/2015  . Morbid obesity ([place]) 12/27/2015  . Gallstone pancreatitis 03/11/2012  . Abdominal pain, acute 03/11/2012  . Nausea & vomiting 03/11/2012  . Postpartum state 03/11/2012  . Trichimoniasis 01/27/2012  . Group B streptococcal infection in pregnancy 01/27/2012  . Postpartum care following vaginal delivery (1/4) 01/27/2012  . SVD (spontaneous vaginal delivery) 01/27/2012   PCP:  Patient, No Pcp  Per Pharmacy:   [facility] [address] ([address] [place] [facility] [place] [address] [place] [address] Phone: [phone] Fax: [phone]     Social Determinants of Health (SDOH) Interventions    Readmission Risk Interventions No flowsheet data found.

## 2018-04-14 NOTE — Discharge Instructions (Addendum)
You were hospitalized for a blood clot that traveled to your lungs.  This was likely caused by a long period of sitting down (probably from travel).  You will go  home with the appropriate treatment which is a blood thinner (Xarelto, information below).  You were started on metformin while here in the hospital, this will be increased in the coming weeks.  You were also started on a blood pressure medication, Losartan, please continue to talk to your PCP about this.  Additional information will be included in the documentation sent to your PCP.   Information on my medicine - XARELTO (rivaroxaban)  This medication education was reviewed with me or my healthcare representative as part of my discharge preparation.  The pharmacist that spoke with me during my hospital stay was:  Ronna Polio, [place]? Xarelto was prescribed to treat blood clots that may have been found in the veins of your legs (deep vein thrombosis) or in your lungs (pulmonary embolism) and to reduce the risk of them occurring again.  What do you need to know about Xarelto? The starting dose is one 15 mg tablet taken TWICE daily with food for the FIRST 21 DAYS then on 05/05/18 the dose is changed to one 20 mg tablet taken ONCE A DAY with your evening meal through 07/15/18.  You may crush Xarelto  DO NOT stop taking Xarelto without talking to the health care provider who prescribed the medication.  Refill your prescription for 20 mg tablets before you run out.  After discharge, you should have regular check-up appointments with your healthcare provider that is prescribing your Xarelto.  In the future your dose may need to be changed if your kidney function changes by a significant amount.  What do you do if you miss a dose? If you are taking Xarelto TWICE DAILY and you miss a dose, take it as soon as you remember. You may take two 15 mg tablets (total 30 mg) at the same time then resume your  regularly scheduled 15 mg twice daily the next day.  If you are taking Xarelto ONCE DAILY and you miss a dose, take it as soon as you remember on the same day then continue your regularly scheduled once daily regimen the next day. Do not take two doses of Xarelto at the same time.   Important Safety Information Xarelto is a blood thinner medicine that can cause bleeding. You should call your healthcare provider right away if you experience any of the following: ? Bleeding from an injury or your nose that does not stop. ? Unusual colored urine (red or dark brown) or unusual colored stools (red or black). ? Unusual bruising for unknown reasons. ? A serious fall or if you hit your head (even if there is no bleeding).  Some medicines may interact with Xarelto and might increase your risk of bleeding while on Xarelto. To help avoid this, consult your healthcare provider or pharmacist prior to using any new prescription or non-prescription medications, including herbals, vitamins, non-steroidal anti-inflammatory drugs (NSAIDs) and supplements.  This website has more information on Xarelto: [email or web address]

## 2018-04-14 NOTE — Progress Notes (Signed)
  Echocardiogram 2D Echocardiogram has been performed.  A limited echocardiogram was performed per the Director's protocol to limit patient/technician contact.  Madelaine Etienne 04/14/2018, 1:42 PM

## 2018-04-14 NOTE — Discharge Summary (Signed)
[facility] Discharge Summary  Patient name: Whitney [name] Medical record number: [id number] Date of birth: [birth year] Age: 33 y.o. Gender: female Date of Admission: 04/13/2018  Date of Discharge: 04/15/2018 Admitting Physician: Zenia Resides, MD  Primary Care Provider: Scot Jun, FNP Consultants: None  Indication for Hospitalization: Chest pain  Discharge Diagnoses/Problem List:  Provoked submassive bilateral PE Hypertension Diabetes, type II  Disposition: Discharge home  Discharge Condition: stable  Discharge Exam: General: Alert and cooperative and appears to be in no acute distress.  HEENT: Moist mucous membranes Cardio: Normal S1 and S2, no S3 or S4. Rhythm is regular. No murmurs or rubs.   Pulm: Clear to auscultation bilaterally, no crackles, wheezing, or diminished breath sounds. Normal respiratory effort Abdomen: Bowel sounds normal. Abdomen soft and non-tender.  Extremities: No peripheral edema. Warm/ well perfused.  Strong radial pulse.  Mild tenderness to left lower extremity, no erythema, no appreciable swelling. Neuro: Cranial nerves grossly intact   Brief Hospital Course:   Submassive PE Patient admitted to ED on 3/21 for left leg pain and dyspnea on exertion. BP was elevated, pt not requiring oxygen, not tachypnic.  CTA showed bilateral pulmonary emboli. LE doppler u/s showed DVT of the left popliteal vein and left posterior tibial vein.  She was admitted and was started on heparin drip.  She was transitioned to p.o. Xarelto.  Echocardiogram on 3/22 showed no evidence of right heart strain.  Her risk factors include: Family history of DVT in maternal grandmother and maternal aunt, recent travel with prolonged time seated, Nexplanon use.  She plans to have her Nexplanon removed in the outpatient setting.  3/23, she was found to be medically stable and discharged home with follow-up outpatient.  Issues for Follow Up:  1. She was  started on Xarelto on 3/22.  She will require therapy for 90 days.  Xarelto 15 mg twice daily for the first 21 days followed by Xarelto 20 mg daily for the remainder of the time. 2. She is found to be hypertensive during the hospitalization with systolics routinely around 150.  She was started on losartan 25 mg the day of discharge.  Follow-up with blood pressure monitoring and potassium check. 3. Her diabetes is poorly controlled as she had no PCP.  She was started on metformin 500 twice daily in the hospital.  Assess GI side effects and increase as appropriate to 1000 mg twice daily. 4. Consider work-up of obstructive sleep apnea/obesity hypoventilation syndrome.  She has factors and, if positive, this would contribute to her hypertension.  Significant Procedures: none  Significant Labs and Imaging:  Recent Labs  Lab 04/13/18 1204 04/14/18 0557 04/15/18 0441  WBC 10.9* 10.7* 10.9*  HGB 14.2 13.8 13.8  HCT 43.6 42.0 42.0  PLT 185 198 211   Recent Labs  Lab 04/13/18 1204 04/14/18 0557  NA 138 137  K 3.7 3.6  CL 107 104  CO2 23 24  GLUCOSE 199* 165*  BUN <5* <5*  CREATININE 0.91 0.83  CALCIUM 10.0 9.9  ALKPHOS 77  --   AST 24  --   ALT 39  --   ALBUMIN 3.7  --     Dg Chest 2 View  Result Date: 04/13/2018 CLINICAL DATA:  Shortness of breath and chest tightness today. EXAM: CHEST - 2 VIEW COMPARISON:  PA and lateral chest 12/11/2017. FINDINGS: Lungs clear. Heart size normal. No pneumothorax or pleural fluid. No acute or focal bony abnormality. IMPRESSION: Negative chest. Electronically  Signed   By: Inge Rise M.D.   On: 04/13/2018 12:30   Ct Angio Chest Pe W/cm &/or Wo Cm  Result Date: 04/13/2018 CLINICAL DATA:  Left leg pain a couple of weeks. Some shortness of breath over the past 3 days. EXAM: CT ANGIOGRAPHY CHEST WITH CONTRAST TECHNIQUE: Multidetector CT imaging of the chest was performed using the standard protocol during bolus administration of intravenous contrast.  Multiplanar CT image reconstructions and MIPs were obtained to evaluate the vascular anatomy. CONTRAST:  135m OMNIPAQUE IOHEXOL 350 MG/ML SOLN COMPARISON:  Chest x-ray today. FINDINGS: Cardiovascular: Heart size is normal. Thoracic aorta is normal. Pulmonary arterial system is well opacified demonstrates moderate emboli over the distal main right pulmonary artery extending into the proximal upper, middle and lower lobar arteries. There are several small more peripheral left-sided pulmonary emboli. There is evidence of right heart strain with RV/LV ratio of 51.8/44.4 equals 1.16. Remaining vascular structures are unremarkable. Mediastinum/Nodes: No significant mediastinal or hilar adenopathy. Remaining mediastinal structures are unremarkable. Lungs/Pleura: Lungs are well inflated without focal airspace consolidation or effusion. Very subtle hazy left perihilar attenuation likely mild vascular congestion. 2 mm nodular density over the medial right upper lobe. 5 mm nodular density over the left lower lobe. Airways are normal. Upper Abdomen: No acute findings. Musculoskeletal: Unremarkable. Review of the MIP images confirms the above findings. IMPRESSION: Moderate right-sided pulmonary emboli as described with small more peripheral left pulmonary emboli. Positive for acute PE with CT evidence of right heart strain (RV/LV Ratio = 1.16) consistent with at least submassive (intermediate risk) PE. The presence of right heart strain has been associated with an increased risk of morbidity and mortality. Please activate Code PE by paging [phone] Critical Value/emergent results were called by telephone at the time of interpretation on 04/13/2018 at 2:22 pm to Dr. JResa Miner who verbally acknowledged these results. Electronically Signed   By: DMarin OlpM.D.   On: 04/13/2018 14:23   Vas [place] Extremity Venous (dvt) (only Mc & Wl 7a-7p)  Result Date: 04/14/2018  Lower Venous Study Risk Factors: Confirmed PE.  Comparison Study: No prior study on file for comparison Performing Technologist: CSharion DoveRVS  Examination Guidelines: A complete evaluation includes B-mode imaging, spectral Doppler, color Doppler, and power Doppler as needed of all accessible portions of each vessel. Bilateral testing is considered an integral part of a complete examination. Limited examinations for reoccurring indications may be performed as noted.  Right Venous Findings: +---+---------------+---------+-----------+----------+-------+    CompressibilityPhasicitySpontaneityPropertiesSummary +---+---------------+---------+-----------+----------+-------+ CFVFull           Yes      Yes                          +---+---------------+---------+-----------+----------+-------+  Left Venous Findings: +---------+---------------+---------+-----------+----------+-----------------+          CompressibilityPhasicitySpontaneityPropertiesSummary           +---------+---------------+---------+-----------+----------+-----------------+ CFV      Full           Yes      Yes                                    +---------+---------------+---------+-----------+----------+-----------------+ SFJ      Full                                                           +---------+---------------+---------+-----------+----------+-----------------+  FV Prox  Full                                                           +---------+---------------+---------+-----------+----------+-----------------+ FV Mid   Full                                                           +---------+---------------+---------+-----------+----------+-----------------+ FV DistalFull                                                           +---------+---------------+---------+-----------+----------+-----------------+ PFV      Full                                                            +---------+---------------+---------+-----------+----------+-----------------+ POP      None           No       No                   Age Indeterminate +---------+---------------+---------+-----------+----------+-----------------+ PTV      None           No       No                   Age Indeterminate +---------+---------------+---------+-----------+----------+-----------------+ PERO     Full                                                           +---------+---------------+---------+-----------+----------+-----------------+    Summary: Right: No evidence of common femoral vein obstruction. Left: Findings consistent with age indeterminate deep vein thrombosis involving the left popliteal vein, and left posterior tibial vein.  *See table(s) above for measurements and observations. Electronically signed by Harold Barban MD on 04/14/2018 at 7:58:26 AM.    Final      Results/Tests Pending at Time of Discharge: none  Discharge Medications:  Allergies as of 04/15/2018   No Known Allergies     Medication List    STOP taking these medications   Dulaglutide 0.75 MG/0.5ML Sopn Commonly known as:  Trulicity   furosemide 20 MG tablet Commonly known as:  LASIX   glucose blood test strip Commonly known as:  ONE TOUCH ULTRA TEST   ONE TOUCH SURESOFT Misc   ONE TOUCH ULTRA MINI w/Device Kit     TAKE these medications   Albuterol Sulfate 108 (90 Base) MCG/ACT Aepb Commonly known as:  ProAir RespiClick Inhale 1-2 puffs into the lungs 4 (four) times daily as needed.   Biotin 1 MG Caps Take 1 mg by mouth daily.  etonogestrel 68 MG Impl implant Commonly known as:  NEXPLANON 1 each by Subdermal route once.   guaiFENesin 600 MG 12 hr tablet Commonly known as:  Mucinex Take 1 tablet (600 mg total) by mouth 2 (two) times daily.   losartan 25 MG tablet Commonly known as:  COZAAR Take 1 tablet (25 mg total) by mouth daily.   metFORMIN 500 MG tablet Commonly known as:   GLUCOPHAGE Take 1 tablet (500 mg total) by mouth 2 (two) times daily with a meal.   prenatal multivitamin Tabs tablet Take 1 tablet by mouth daily.   Rivaroxaban 15 & 20 MG Tbpk Take as directed on package: Start with one 33m tablet by mouth twice a day with food. On Day 22, switch to one 248mtablet once a day with food.   Vitamin D3 1.25 MG (50000 UT) Tabs Take 50,000 Units by mouth once a week.       Discharge Instructions: Please refer to Patient Instructions section of EMR for full details.  Patient was counseled important signs and symptoms that should prompt return to medical care, changes in medications, dietary instructions, activity restrictions, and follow up appointments.   Follow-Up Appointments:   FrMatilde HaymakerMD 04/15/2018, 1:52 PM PGY-1, [place]

## 2018-04-15 LAB — CBC
HCT: 42 % (ref 36.0–46.0)
Hemoglobin: 13.8 g/dL (ref 12.0–15.0)
MCH: 26.4 pg (ref 26.0–34.0)
MCHC: 32.9 g/dL (ref 30.0–36.0)
MCV: 80.5 fL (ref 80.0–100.0)
Platelets: 211 K/uL (ref 150–400)
RBC: 5.22 MIL/uL — ABNORMAL HIGH (ref 3.87–5.11)
RDW: 12.2 % (ref 11.5–15.5)
WBC: 10.9 K/uL — ABNORMAL HIGH (ref 4.0–10.5)
nRBC: 0 % (ref 0.0–0.2)

## 2018-04-15 LAB — GLUCOSE, CAPILLARY
Glucose-Capillary: 153 mg/dL — ABNORMAL HIGH (ref 70–99)
Glucose-Capillary: 166 mg/dL — ABNORMAL HIGH (ref 70–99)

## 2018-04-15 MED ORDER — RIVAROXABAN 20 MG PO TABS: 20.0000 mg | ORAL_TABLET | Freq: Every day | ORAL | 0 refills | Status: DC

## 2018-04-15 MED ORDER — RIVAROXABAN 15 MG PO TABS: 15.0000 mg | ORAL_TABLET | Freq: Two times a day (BID) | ORAL | 0 refills | Status: DC

## 2018-04-15 MED ORDER — RIVAROXABAN (XARELTO) VTE STARTER PACK (15 & 20 MG): ORAL_TABLET | ORAL | 0 refills | Status: DC

## 2018-04-15 MED ORDER — LOSARTAN POTASSIUM 25 MG PO TABS: 25.0000 mg | ORAL_TABLET | Freq: Every day | ORAL | 0 refills | Status: DC

## 2018-04-15 MED ORDER — METFORMIN HCL 500 MG PO TABS: 500.0000 mg | ORAL_TABLET | Freq: Two times a day (BID) | ORAL | 0 refills | Status: DC

## 2018-04-15 MED ORDER — LOSARTAN POTASSIUM 25 MG PO TABS
25.0000 mg | ORAL_TABLET | Freq: Every day | ORAL | Status: DC
  Administered 2018-04-15: 25 mg via ORAL
  Filled 2018-04-15: qty 1

## 2018-04-15 MED FILL — LOSARTAN POTASSIUM 25 MG TA: 25 | 30 days supply | Qty: 30 | Fill #0

## 2018-04-15 MED FILL — XARELTO STARTER PACK: 15 & 20 | 30 days supply | Qty: 51 | Fill #0

## 2018-04-15 MED FILL — metFORMIN HCL 500 MG TABS: 500 | 30 days supply | Qty: 60 | Fill #0

## 2018-04-15 NOTE — TOC Transition Note (Signed)
Transition of Care [facility]) - CM/SW Discharge Note   Patient Details  Name: Whitney [name] MRN: [id number] Date of Birth: 31-May-1985  Transition of Care [facility]) CM/SW Contact:  Bartholomew Crews, RN Phone Number: 04/15/2018, 1:37 PM   Clinical Narrative:    Received call from attending to set patient up with PCP. Spoke with patient at bedside who agreed with Primary Care at [facility]. Earliest hospital f/u appointment is 05/20/2018 at 0930. No other transition of care needs identified at this time.   Final next level of care: Home/Self Care Barriers to Discharge: No Barriers Identified   Patient Goals and CMS Choice     Choice offered to / list presented to : NA  Discharge Placement                       Discharge Plan and Services   Discharge Planning Services: Medication Assistance                      Social Determinants of Health (SDOH) Interventions     Readmission Risk Interventions No flowsheet data found.

## 2018-04-15 NOTE — TOC Benefit Eligibility Note (Signed)
Transition of Care [facility]) Benefit Eligibility Note    Patient Details  Name: Whitney [name] MRN: [id number] Date of Birth: 02-Sep-1985   Medication/Dose: Alveda Reasons  15 MG / 20 MG   AND ELIQUIS  10 MG BID / 5 MG BID  Covered?: No     Prescription Coverage Preferred Pharmacy: [facility]  Spoke with Person/Company/Phone Number:: [facility] @ [employer] [place] # [phone]     Prior Approval: Yes  Deductible: Unmet  Additional Notes: PRIOR APPROVAL-  YES #  [phone]    Memory Argue Phone Number: 04/15/2018, 2:13 PM

## 2018-04-15 NOTE — Progress Notes (Signed)
Spoke with patient at bedside to verify that patient had xarelto 30 day card and reduced copay card. Patient had cards at bedside. Patient denies any other transition of care needs. Reports that MD to come back this afternoon to discuss plan, and if ready to transition home, she has transportation home. Patient states that she will f/u with her providers outpatient and has no questions about her self care. No other transition of care needs identified.

## 2018-05-13 ENCOUNTER — Ambulatory Visit (INDEPENDENT_AMBULATORY_CARE_PROVIDER_SITE_OTHER): Payer: 59 | Admitting: Family Medicine

## 2018-05-13 ENCOUNTER — Encounter: Payer: Self-pay | Admitting: Family Medicine

## 2018-05-13 ENCOUNTER — Other Ambulatory Visit: Payer: Self-pay

## 2018-05-13 DIAGNOSIS — I2699 Other pulmonary embolism without acute cor pulmonale: Secondary | ICD-10-CM | POA: Diagnosis not present

## 2018-05-13 DIAGNOSIS — I10 Essential (primary) hypertension: Secondary | ICD-10-CM | POA: Diagnosis not present

## 2018-05-13 DIAGNOSIS — E119 Type 2 diabetes mellitus without complications: Secondary | ICD-10-CM | POA: Diagnosis not present

## 2018-05-13 MED ORDER — RIVAROXABAN 20 MG PO TABS: 20.0000 mg | ORAL_TABLET | Freq: Every day | ORAL | 4 refills | Status: DC

## 2018-05-13 MED ORDER — LOSARTAN POTASSIUM 50 MG PO TABS: 50.0000 mg | ORAL_TABLET | Freq: Every day | ORAL | 2 refills | Status: DC

## 2018-05-13 MED ORDER — METFORMIN HCL 500 MG PO TABS: 500.0000 mg | ORAL_TABLET | Freq: Two times a day (BID) | ORAL | 2 refills | Status: DC

## 2018-05-13 NOTE — Progress Notes (Signed)
Virtual Visit via Telephone Note  I connected with Bishop Dublin on 05/13/18 at  9:30 AM EDT by telephone and verified that I am speaking with the correct person using two identifiers.   I discussed the limitations, risks, security and privacy concerns of performing an evaluation and management service by telephone and the availability of in person appointments. I also discussed with the patient that there may be a patient responsible charge related to this service. The patient expressed understanding and agreed to proceed.  Provided located in primary care office during today's telephonic encounter.  History of Present Illness: Hospital follow-up and Establish Care as New Patient Alise was admitted to inpatient services at [facility] 04/13/18 after presenting to the urgent care with a complaint of left leg pain.  She was found to have a DVT of the lower left leg and bilateral PEs with presence of right heart strain. DVT was thought to be provoked by recent prolonged travel out of state. No history of any prior hypercoagulable event. She was admitted to impatient services with an ordered heparin drip. She was started on Xarelto. During hospitalization [place] was found to have hypertension with systolic readings consistently >150. She was started om Losartan 25 mg once daily. She also had a known history of Type 2 diabetes. A1c during recent admission was found to be 8.5. Reports being diagnosed greater than 1 year ago and has been off medication for several months. No home monitoring of glucose. She was discharged on metformin 500 mg twice daily. Reports BP readings have remained elevated greater than 140/90. She endorses residual chest tightness at intermittently. She report resolution of shortness of breath and is slowly returning to baseline level of activity. She denies swelling, headaches, or chest pain. She is in need of refills of medication.  She is complaining of unrelated chronic issue of  abdominal pain, on-going for than 1 year. Pain is characterizes by an intermittent aching type pain which is occasionally sharp. She has undergone prior work-up for this problem with OBGYN and [place] of pelvic revealed only small left fundal fibroid measuring 1.5 cm. She followed up with OB/GYN in which fibroid was not identified as the source of chronic bilateral lower abdominal/ pelvic pain. She is unable to identify any precipitating factors which worsens or causes pain to occur and nor is she able to identify any activity of which worsens pain.  Assessment and Plan: 1. Type 2 diabetes mellitus without complication, without long-term current use of insulin (HCC) Recent A1C 8.5 indicating poor control.  Repeating A1C in 1 week. Will titrate Metformin and possibly add Glipizide if A1C has not improved. Aim for 30 minutes of exercise most days, with a goal of 150 minutes per week. -Glucose monitoring at minimal of twice daily and keep a log of readings. -Commit to medication adherence and self-adjustment as needed -increase foods containing whole grains (one-half of grain intake). -saturated fat intake should be reduced -reduce intake of trans fat (lowers LDL cholesterol and increases HDL cholesterol) -Eat 4-5 small meals during the day to reduce the risk of becoming hungry.  2. Morbid obesity ([place]) Encouraged efforts to reduce weight include engaging in physical activity as tolerated with goal of 150 minutes per week. Improve dietary choices and eat a meal regimen consistent with a Mediterranean or DASH diet. Reduce simple carbohydrates. Do not skip meals and eat healthy snacks throughout the day to avoid over-eating at dinner. Set a goal weight loss that is achievable for  you.    3. Essential hypertension BP remains not at goal of less than 130/90 since starting medication. Increase losartan 50 mg once daily.  Encourage low sodium intake Encouraged routine physical activity in efforts to reduce  BMI   4. Acute pulmonary embolism, unspecified pulmonary embolism type, unspecified whether acute cor pulmonale present (HCC) -Recommending anticoagulation for provoked PE for a period of 4-6 months -Continue Xarelto 20 mg once daily until advised to discontinue  -Avoid prolonged periods of inactivity -Patient is scheduled to have Nexplanon removed by Dr. Florene Glen in May.   Follow Up Instructions: Fasting lab appointment scheduled 4/29 RTC: 3 months chronic condition management    I discussed the assessment and treatment plan with the patient. The patient was provided an opportunity to ask questions and all were answered. The patient agreed with the plan and demonstrated an understanding of the instructions.   The patient was advised to call back or seek an in-person evaluation if the symptoms worsen or if the condition fails to improve as anticipated.  I provided 25 minutes of non-face-to-face time during this encounter.   Molli Barrows, FNP

## 2018-05-13 NOTE — Progress Notes (Deleted)
Called patient to initiate their telephone visit with provider Molli Barrows, FNP-C. Verified date of birth. ED->Hospital 3/21-3/23: provoked submassive B pulm embolism, chest pain & SHOB. Still having occasional SHOB & chest tightness. Denies any abnormal bleeding since being on the blood thinner. KWalker, CMA.

## 2018-05-20 ENCOUNTER — Inpatient Hospital Stay: Payer: 59 | Admitting: Family Medicine

## 2018-05-21 ENCOUNTER — Telehealth: Payer: Self-pay

## 2018-05-21 NOTE — Telephone Encounter (Signed)
Called patient to do their pre-visit COVID screening.  Call went to voicemail. Unable to prescreen patient.

## 2018-05-22 ENCOUNTER — Ambulatory Visit: Payer: 59

## 2018-05-22 ENCOUNTER — Other Ambulatory Visit: Payer: Self-pay

## 2018-05-22 VITALS — BP 172/114 | HR 92 | Wt 372.2 lb

## 2018-05-22 DIAGNOSIS — Z7901 Long term (current) use of anticoagulants: Secondary | ICD-10-CM

## 2018-05-22 DIAGNOSIS — I2699 Other pulmonary embolism without acute cor pulmonale: Secondary | ICD-10-CM

## 2018-05-22 DIAGNOSIS — E119 Type 2 diabetes mellitus without complications: Secondary | ICD-10-CM

## 2018-05-22 NOTE — Progress Notes (Signed)
Patient here for BP check & labs. Per provider request weight was documented as well. After letting patient sit for 15 minutes BP was 172/114, pulse was 92. Patient states that she did not take BP medications this morning b/c she was fasting for her labs. Spoke with provider & she states to have patient continue Losartan 50 mg tablet and to return to office in 2 weeks for a BP check. Patient is to take BP medication & bring cuff with her to visit so that it can be compared with our machine

## 2018-05-23 LAB — COMPREHENSIVE METABOLIC PANEL
ALT: 44 IU/L — ABNORMAL HIGH (ref 0–32)
AST: 19 IU/L (ref 0–40)
Albumin/Globulin Ratio: 1.5 (ref 1.2–2.2)
Albumin: 3.7 g/dL — ABNORMAL LOW (ref 3.8–4.8)
Alkaline Phosphatase: 57 IU/L (ref 39–117)
BUN/Creatinine Ratio: 11 (ref 9–23)
BUN: 8 mg/dL (ref 6–20)
Bilirubin Total: <0.2 mg/dL (ref 0.0–1.2)
CO2: 20 mmol/L (ref 20–29)
Calcium: 9.8 mg/dL (ref 8.7–10.2)
Chloride: 106 mmol/L (ref 96–106)
Creatinine, Ser: 0.73 mg/dL (ref 0.57–1.00)
GFR calc Af Amer: 125 mL/min/{1.73_m2} (ref >59)
GFR calc non Af Amer: 109 mL/min/{1.73_m2} (ref >59)
Globulin, Total: 2.5 g/dL (ref 1.5–4.5)
Glucose: 204 mg/dL — ABNORMAL HIGH (ref 65–99)
Potassium: 4 mmol/L (ref 3.5–5.2)
Sodium: 138 mmol/L (ref 134–144)
Total Protein: 6.2 g/dL (ref 6.0–8.5)

## 2018-05-23 LAB — CBC WITH DIFFERENTIAL/PLATELET
Basophils Absolute: 0 10*3/uL (ref 0.0–0.2)
Basos: 1 %
EOS (ABSOLUTE): 0.2 10*3/uL (ref 0.0–0.4)
Eos: 2 %
Hematocrit: 39.5 % (ref 34.0–46.6)
Hemoglobin: 13.1 g/dL (ref 11.1–15.9)
Immature Grans (Abs): 0 10*3/uL (ref 0.0–0.1)
Immature Granulocytes: 1 %
Lymphocytes Absolute: 2.2 10*3/uL (ref 0.7–3.1)
Lymphs: 27 %
MCH: 27.5 pg (ref 26.6–33.0)
MCHC: 33.2 g/dL (ref 31.5–35.7)
MCV: 83 fL (ref 79–97)
Monocytes Absolute: 0.6 10*3/uL (ref 0.1–0.9)
Monocytes: 7 %
Neutrophils Absolute: 5.2 10*3/uL (ref 1.4–7.0)
Neutrophils: 62 %
Platelets: 303 10*3/uL (ref 150–450)
RBC: 4.76 x10E6/uL (ref 3.77–5.28)
RDW: 13.1 % (ref 11.7–15.4)
WBC: 8.3 10*3/uL (ref 3.4–10.8)

## 2018-05-23 LAB — LIPID PANEL
Chol/HDL Ratio: 3.3 ratio (ref 0.0–4.4)
Cholesterol, Total: 137 mg/dL (ref 100–199)
HDL: 42 mg/dL (ref >39)
LDL Calculated: 73 mg/dL (ref 0–99)
Triglycerides: 112 mg/dL (ref 0–149)
VLDL Cholesterol Cal: 22 mg/dL (ref 5–40)

## 2018-05-23 LAB — HEMOGLOBIN A1C
Est. average glucose Bld gHb Est-mCnc: 177 mg/dL
Hgb A1c MFr Bld: 7.8 % — ABNORMAL HIGH (ref 4.8–5.6)

## 2018-05-27 ENCOUNTER — Telehealth: Payer: Self-pay | Admitting: Family Medicine

## 2018-05-27 ENCOUNTER — Emergency Department (HOSPITAL_COMMUNITY)
Admission: EM | Admit: 2018-05-27 | Discharge: 2018-05-27 | Disposition: A | Discharge status: Home / Self Care | Payer: 59 | Attending: Emergency Medicine | Admitting: Emergency Medicine

## 2018-05-27 ENCOUNTER — Telehealth (HOSPITAL_COMMUNITY): Payer: Self-pay | Admitting: Family Medicine

## 2018-05-27 ENCOUNTER — Other Ambulatory Visit: Payer: Self-pay | Admitting: Family Medicine

## 2018-05-27 ENCOUNTER — Other Ambulatory Visit: Payer: Self-pay

## 2018-05-27 ENCOUNTER — Emergency Department (HOSPITAL_COMMUNITY): Payer: 59

## 2018-05-27 ENCOUNTER — Encounter (HOSPITAL_COMMUNITY): Payer: Self-pay | Admitting: *Deleted

## 2018-05-27 DIAGNOSIS — Z7901 Long term (current) use of anticoagulants: Secondary | ICD-10-CM | POA: Insufficient documentation

## 2018-05-27 DIAGNOSIS — Z79899 Other long term (current) drug therapy: Secondary | ICD-10-CM | POA: Diagnosis not present

## 2018-05-27 DIAGNOSIS — R202 Paresthesia of skin: Secondary | ICD-10-CM

## 2018-05-27 DIAGNOSIS — Z87891 Personal history of nicotine dependence: Secondary | ICD-10-CM | POA: Diagnosis not present

## 2018-05-27 DIAGNOSIS — E119 Type 2 diabetes mellitus without complications: Secondary | ICD-10-CM | POA: Diagnosis not present

## 2018-05-27 DIAGNOSIS — I10 Essential (primary) hypertension: Secondary | ICD-10-CM | POA: Diagnosis not present

## 2018-05-27 NOTE — ED Provider Notes (Signed)
[facility] EMERGENCY DEPARTMENT Provider Note   CSN: [id number] Arrival date & time: 05/27/18  1552    History   Chief Complaint Chief Complaint  Patient presents with  . Leg Pain    HPI Whitney [name] is a 33 y.o. 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  Patient states on [place] and was immobile for about 6 hours.  She states yesterday she began feeling a tingling sensation in the anterior aspect of her lower left leg that felt somewhat similar to her how her previous DVT began.  She states she also feels a little bit of the tingly sensation in her lateral left thigh as well.  She is compliant with her Eliquis and has not missed any doses.  She states just while being in the ED she developed a little bit of shortness of breath with walking around, however feels fine at rest.  She presents today with concern for recurrence of DVT and PE given her symptoms. She reports chronic mid back pain, however no history of sciatica.  No exogenous estrogen use, recent surgery or trauma.  No injuries.  No swelling or redness.     The history is provided by the patient.    Past Medical History:  Diagnosis Date  . Diabetes mellitus without complication ([place])   . Gallstones   . Headache(784.0)   . No pertinent past medical history     Patient Active Problem List   Diagnosis Date Noted  . Acute deep vein thrombosis (DVT) of popliteal vein of left lower extremity ([place])   . Essential hypertension   . Pulmonary emboli ([place]) 04/13/2018  . Type 2 diabetes mellitus ([place]) 01/17/2016  . Morbid obesity ([place]) 12/27/2015  . Gallstone pancreatitis 03/11/2012  . Abdominal pain, acute 03/11/2012  . Group B streptococcal infection in pregnancy 01/27/2012    Past Surgical History:  Procedure  Laterality Date  . CHOLECYSTECTOMY N/A 03/12/2012   Procedure: LAPAROSCOPIC CHOLECYSTECTOMY;  Surgeon: Harl Bowie, MD;  Location: [facility];  Service: General;  Laterality: N/A;     OB History    Gravida  1   Para  1   Term  1   Preterm  0   AB  0   Living  1     SAB  0   TAB  0   Ectopic  0   Multiple  0   Live Births  1            Home Medications    Prior to Admission medications   Medication Sig Start Date End Date Taking? Authorizing Provider  Biotin 1 MG CAPS Take 1 mg by mouth daily.     [provider]  etonogestrel (NEXPLANON) 68 MG IMPL implant 1 each by Subdermal route once.    [provider]  losartan (COZAAR) 50 MG tablet Take 1 tablet (50 mg total) by mouth daily. 05/13/18   Scot Jun, FNP  metFORMIN (GLUCOPHAGE) 500 MG tablet Take 1 tablet (500 mg total) by mouth 2 (two) times daily with a meal. 05/13/18   Scot Jun, FNP  Prenatal Vit-Fe Fumarate-FA (PRENATAL MULTIVITAMIN) TABS Take 1 tablet by mouth daily.    [provider]  rivaroxaban (XARELTO) 20 MG TABS tablet Take 1 tablet (20 mg total) by  mouth daily with supper. 05/13/18   Scot Jun, FNP    Family History Family History  Problem Relation Age of Onset  . Healthy Mother   . Ulcerative colitis Father   . Diabetes Maternal Grandmother   . Breast cancer Maternal Grandmother   . Syncope episode Maternal Grandfather   . Benign prostatic hyperplasia Maternal Grandfather   . Brain cancer Paternal Grandmother   . Lung cancer Paternal Grandfather   . Anesthesia problems Neg Hx   . Hypotension Neg Hx   . Malignant hyperthermia Neg Hx   . Pseudochol deficiency Neg Hx     Social History Social History   Tobacco Use  . Smoking status: Former Smoker    Types: Cigarettes    Last attempt to quit: 05/24/2011    Years since quitting: 7.0  . Smokeless tobacco: Never Used  Substance Use Topics  . Alcohol use: Yes    Comment: Once a month  on average  . Drug use: No     Allergies   Patient has no known allergies.   Review of Systems Review of Systems  All other systems reviewed and are negative.    Physical Exam Updated Vital Signs BP (!) 173/107 (BP Location: Right Arm)   Pulse 90   Resp 20   SpO2 98%   Physical Exam Vitals signs and nursing note reviewed.  Constitutional:      Appearance: She is well-developed.     Comments: Morbidly obese.  No distress.  HENT:     Head: Normocephalic and atraumatic.  Eyes:     Conjunctiva/sclera: Conjunctivae normal.  Cardiovascular:     Rate and Rhythm: Normal rate and regular rhythm.  Pulmonary:     Effort: Pulmonary effort is normal. No respiratory distress.     Breath sounds: Normal breath sounds.     Comments: Normal work of breathing.  Speaking in full sentences. Chest:     Chest wall: No tenderness.  Abdominal:     Palpations: Abdomen is soft.  Musculoskeletal:     Comments: No swelling to left leg in comparison to the right.  There is no redness.  Patient reports a mild tenderness to the calf.  Neurovascularly intact.  No tenderness to the low back.  Skin:    General: Skin is warm.  Neurological:     Mental Status: She is alert.  Psychiatric:        Behavior: Behavior normal.      ED Treatments / Results  Labs (all labs ordered are listed, but only abnormal results are displayed) Labs Reviewed - No data to display  EKG None  Radiology Dg Chest 2 View  Result Date: 05/27/2018 CLINICAL DATA:  Shortness of breath. EXAM: CHEST - 2 VIEW COMPARISON:  CT chest dated 04/13/2018 FINDINGS: The heart size and mediastinal contours are within normal limits. Both lungs are clear. The visualized skeletal structures are unremarkable. IMPRESSION: No active cardiopulmonary disease. Electronically Signed   By: Constance Holster M.D.   On: 05/27/2018 16:49    Procedures Procedures (including critical care time)  Medications Ordered in ED Medications - No  data to display   Initial Impression / Assessment and Plan / ED Course  I have reviewed the triage vital signs and the nursing notes.  Pertinent labs & imaging results that were available during my care of the patient were reviewed by me and considered in my medical decision making (see chart for details).        Patient  presenting to the emergency department with concern for recurrence of DVT and PE after recent admission in March.  Patient is on anticoagulation and is compliant.  Vital signs are normal here.  Normal work of breathing, speaking in full sentences.  Patient discussed with Dr. Jeanell Sparrow.  Unlikely recurrence of clot as patient is currently anticoagulated and reports compliance.  No further work-up is indicated at this time.  Discussed possibility of sciatica causing the sensation in the leg as patient was sitting for an extended duration of time.  Discussed return precautions.  Patient agreeable to plan and safe for discharge at this time. PCP follow up recommended and discussed.  Discussed results, findings, treatment and follow up. Patient advised of return precautions. Patient verbalized understanding and agreed with plan.  Final Clinical Impressions(s) / ED Diagnoses   Final diagnoses:  Paresthesia of lower extremity    ED Discharge Orders    None       Jericha Bryden, [place] N, PA-C 05/27/18 1910    Pattricia Boss, MD 05/27/18 [id number]

## 2018-05-27 NOTE — Telephone Encounter (Signed)
Please advise the patient to follow-up at the ER to confirm that she has not developed any new blood clots. Also please confirm that she hasn't missed any doses of her anticoagulant therapy.

## 2018-05-27 NOTE — Telephone Encounter (Signed)
Patient called stating that her left leg was in the same pain that she felt when they found the blood clots and her left arm is in pain as well.

## 2018-05-27 NOTE — Discharge Instructions (Addendum)
Your EKG is reassuring and looks unchanged from your previous. follow-up closely with your primary care provider for any further concerns. Continue taking her blood thinner as prescribed. If you develop worsening shortness of breath, or concerning symptoms, you can report back to the emergency department for reevaluation.

## 2018-05-27 NOTE — ED Triage Notes (Signed)
Pt here for eval for DVT to L leg d/t pain, pt admitted in March for DVT and bil PE, pt takes Eliquis, pt reports SOB and L leg  Pain onset, A&O x4

## 2018-05-27 NOTE — Telephone Encounter (Signed)
Notify patient recent labs were significant for the following: recent A1C 7.8 slightly improved from prior  A1C 8.5. I would recommend increasing metformin 1000 mg BID to reach A1C goal of less than 7.0. Cholesterol is normal. I would like to see her in office for diabetes follow-up in 3 months. Keep schedule BP nurse visit follow-up.

## 2018-05-27 NOTE — Telephone Encounter (Signed)
erroneous

## 2018-05-27 NOTE — Telephone Encounter (Signed)
Patient advised to go to the ER for evaluation.  She states that she hasn't missed any doses however on Saturday she went to [facility]. It was about an hour long drive. She did get out of the car when they arrived to use the bathroom but it took about 5 1/2 hours [place] & she wasn't able to get out since the animals freely roam around. She then had the 1 hour drive home. She took her anticoagulant when she got home Saturday night & her symptoms started yesterday.  She also wanted me to let you know that OBGYN canceled her Nexplanon removal that was supposed to be today.

## 2018-05-28 MED ORDER — METFORMIN HCL 1000 MG PO TABS: 1000.0000 mg | ORAL_TABLET | Freq: Two times a day (BID) | ORAL | 3 refills | Status: DC

## 2018-05-28 NOTE — Telephone Encounter (Signed)
Patient notified of lab results & recommendations. Expressed understanding. Made a 3 month follow up appt on 09/02/18 @ 9:50 AM. Prescription released to pharmacy on file.

## 2018-06-05 ENCOUNTER — Ambulatory Visit: Payer: 59

## 2018-06-10 ENCOUNTER — Other Ambulatory Visit: Payer: Self-pay

## 2018-06-10 ENCOUNTER — Ambulatory Visit (INDEPENDENT_AMBULATORY_CARE_PROVIDER_SITE_OTHER): Payer: 59 | Admitting: Family Medicine

## 2018-06-10 VITALS — BP 174/118 | HR 88

## 2018-06-10 DIAGNOSIS — I10 Essential (primary) hypertension: Secondary | ICD-10-CM

## 2018-06-10 DIAGNOSIS — Z0131 Encounter for examination of blood pressure with abnormal findings: Secondary | ICD-10-CM

## 2018-06-10 MED ORDER — HYDROCHLOROTHIAZIDE 25 MG PO TABS: 25.0000 mg | ORAL_TABLET | Freq: Every day | ORAL | 3 refills | Status: DC

## 2018-06-10 MED ORDER — LOSARTAN POTASSIUM 50 MG PO TABS: 100.0000 mg | ORAL_TABLET | Freq: Every day | ORAL | 3 refills | Status: DC

## 2018-06-10 NOTE — Progress Notes (Signed)
Blood pressure is elevated today.  Reading was 174/118.  Pulse is 88.  Patient reports that home readings have consistently been in the 744Z and 146I systolically and diastolically greater than 90.  Will adjust medications as follows increasing losartan to 100 mg once daily and adding hydrochlorothiazide 25 mg once daily.  Patient will return to office in 3 weeks for hypertension follow-up face-to-face visit.

## 2018-06-10 NOTE — Patient Instructions (Signed)
Your BP remains elevated today.  I am increasing Losartan 100 mg (50 mg two tablets) once daily. Adding Hydrochlorothiazide 25 mg Follow-up in 3 weeks hypertension follow-up    Managing Your Hypertension Hypertension is commonly called high blood pressure. This is when the force of your blood pressing against the walls of your arteries is too strong. Arteries are blood vessels that carry blood from your heart throughout your body. Hypertension forces the heart to work harder to pump blood, and may cause the arteries to become narrow or stiff. Having untreated or uncontrolled hypertension can cause heart attack, stroke, kidney disease, and other problems. What are blood pressure readings? A blood pressure reading consists of a higher number over a lower number. Ideally, your blood pressure should be below 120/80. The first ("top") number is called the systolic pressure. It is a measure of the pressure in your arteries as your heart beats. The second ("bottom") number is called the diastolic pressure. It is a measure of the pressure in your arteries as the heart relaxes. What does my blood pressure reading mean? Blood pressure is classified into four stages. Based on your blood pressure reading, your health care provider may use the following stages to determine what type of treatment you need, if any. Systolic pressure and diastolic pressure are measured in a unit called mm Hg. Normal  Systolic pressure: below 782.  Diastolic pressure: below 80. Elevated  Systolic pressure: 423-536.  Diastolic pressure: below 80. Hypertension stage 1  Systolic pressure: 144-315.  Diastolic pressure: 40-08. Hypertension stage 2  Systolic pressure: 676 or above.  Diastolic pressure: 90 or above. What health risks are associated with hypertension? Managing your hypertension is an important responsibility. Uncontrolled hypertension can lead to:  A heart attack.  A stroke.  A weakened blood vessel  (aneurysm).  Heart failure.  Kidney damage.  Eye damage.  Metabolic syndrome.  Memory and concentration problems. What changes can I make to manage my hypertension? Hypertension can be managed by making lifestyle changes and possibly by taking medicines. Your health care provider will help you make a plan to bring your blood pressure within a normal range. Eating and drinking   Eat a diet that is high in fiber and potassium, and low in salt (sodium), added sugar, and fat. An example eating plan is called the DASH (Dietary Approaches to Stop Hypertension) diet. To eat this way: ? Eat plenty of fresh fruits and vegetables. Try to fill half of your plate at each meal with fruits and vegetables. ? Eat whole grains, such as whole wheat pasta, brown rice, or whole grain bread. Fill about one quarter of your plate with whole grains. ? Eat low-fat diary products. ? Avoid fatty cuts of meat, processed or cured meats, and poultry with skin. Fill about one quarter of your plate with lean proteins such as fish, chicken without skin, beans, eggs, and tofu. ? Avoid premade and processed foods. These tend to be higher in sodium, added sugar, and fat.  Reduce your daily sodium intake. Most people with hypertension should eat less than 1,500 mg of sodium a day.  Limit alcohol intake to no more than 1 drink a day for nonpregnant women and 2 drinks a day for men. One drink equals 12 oz of beer, 5 oz of wine, or 1 oz of hard liquor. Lifestyle  Work with your health care provider to maintain a healthy body weight, or to lose weight. Ask what an ideal weight is for you.  Get at least 30 minutes of exercise that causes your heart to beat faster (aerobic exercise) most days of the week. Activities may include walking, swimming, or biking.  Include exercise to strengthen your muscles (resistance exercise), such as weight lifting, as part of your weekly exercise routine. Try to do these types of exercises for  30 minutes at least 3 days a week.  Do not use any products that contain nicotine or tobacco, such as cigarettes and e-cigarettes. If you need help quitting, ask your health care provider.  Control any long-term (chronic) conditions you have, such as high cholesterol or diabetes. Monitoring  Monitor your blood pressure at home as told by your health care provider. Your personal target blood pressure may vary depending on your medical conditions, your age, and other factors.  Have your blood pressure checked regularly, as often as told by your health care provider. Working with your health care provider  Review all the medicines you take with your health care provider because there may be side effects or interactions.  Talk with your health care provider about your diet, exercise habits, and other lifestyle factors that may be contributing to hypertension.  Visit your health care provider regularly. Your health care provider can help you create and adjust your plan for managing hypertension. Will I need medicine to control my blood pressure? Your health care provider may prescribe medicine if lifestyle changes are not enough to get your blood pressure under control, and if:  Your systolic blood pressure is 130 or higher.  Your diastolic blood pressure is 80 or higher. Take medicines only as told by your health care provider. Follow the directions carefully. Blood pressure medicines must be taken as prescribed. The medicine does not work as well when you skip doses. Skipping doses also puts you at risk for problems. Contact a health care provider if:  You think you are having a reaction to medicines you have taken.  You have repeated (recurrent) headaches.  You feel dizzy.  You have swelling in your ankles.  You have trouble with your vision. Get help right away if:  You develop a severe headache or confusion.  You have unusual weakness or numbness, or you feel faint.  You have  severe pain in your chest or abdomen.  You vomit repeatedly.  You have trouble breathing. Summary  Hypertension is when the force of blood pumping through your arteries is too strong. If this condition is not controlled, it may put you at risk for serious complications.  Your personal target blood pressure may vary depending on your medical conditions, your age, and other factors. For most people, a normal blood pressure is less than 120/80.  Hypertension is managed by lifestyle changes, medicines, or both. Lifestyle changes include weight loss, eating a healthy, low-sodium diet, exercising more, and limiting alcohol. This information is not intended to replace advice given to you by your health care provider. Make sure you discuss any questions you have with your health care provider. Document Released: 10/04/2011 Document Revised: 12/08/2015 Document Reviewed: 12/08/2015 Elsevier Interactive Patient Education  2019 [employer].

## 2018-06-28 ENCOUNTER — Telehealth: Payer: Self-pay

## 2018-06-28 NOTE — Telephone Encounter (Signed)

## 2018-07-01 ENCOUNTER — Other Ambulatory Visit: Payer: Self-pay

## 2018-07-01 ENCOUNTER — Encounter: Payer: Self-pay | Admitting: Family Medicine

## 2018-07-01 ENCOUNTER — Ambulatory Visit (INDEPENDENT_AMBULATORY_CARE_PROVIDER_SITE_OTHER): Payer: 59 | Admitting: Family Medicine

## 2018-07-01 VITALS — BP 140/98 | HR 94 | Temp 98.1°F | Resp 17 | Ht 71.0 in | Wt 366.4 lb

## 2018-07-01 DIAGNOSIS — I10 Essential (primary) hypertension: Secondary | ICD-10-CM | POA: Diagnosis not present

## 2018-07-01 DIAGNOSIS — E119 Type 2 diabetes mellitus without complications: Secondary | ICD-10-CM

## 2018-07-01 NOTE — Progress Notes (Signed)
Subjective:  Whitney [name] is a 33 y.o. female with hypertension.  Patient brings home monitor with her to visit today. Her BP cuff is a wrist cuff.  Home cuff is reading on average approximately 20 points higher than her meter here in office.  Patient was concerned as her home readings have been in the 160s and 180s.  Her blood pressure today is 140/98 she took blood pressure medication earlier today.  She continues to remain asymptomatic of headaches, chest pain, shortness of breath.  She is exercising and is currently on a eating program to facilitate weight loss.  Her current BMI is 51.10.  She is tolerating all medications.    Hypertension ROS: taking medications as instructed, no medication side effects noted, no TIA's, no chest pain on exertion, no dyspnea on exertion and no swelling of ankles.    Objective:  BP (!) 140/98   Pulse 94   Temp 98.1 F (36.7 C) (Temporal)   Resp 17   Ht 5\' 11"  (1.803 m)   Wt (!) 366 lb 6.4 oz (166.2 kg)   SpO2 95%   BMI 51.10 kg/m    General appearance: alert, well developed, well nourished, cooperative and in no distress Head: Normocephalic, without obvious abnormality, atraumatic Respiratory: Respirations even and unlabored, normal respiratory rate Heart: rate and rhythm normal.  Extremities: No gross deformities Skin: Skin color, texture, turgor normal. No rashes seen  Psych: Appropriate mood and affect. Neurologic: Mental status: Alert, oriented to person, place, and time, thought content appropriate.   Assessment and Plan: Hypertension, stable and improving. Current treatment plan is effective, no change in therapy.  Given patient has been off medication for quite some time we will have her return to office in 6 weeks for repeat blood pressure check.  At this time medication should have reached his maximum effect.  If blood pressures remain uncontrolled will titrate or add an additional medication.   Molli Barrows, FNP Primary Care at  [facility] [address] [place] [phone]: [phone]

## 2018-07-01 NOTE — Patient Instructions (Signed)

## 2018-07-16 ENCOUNTER — Telehealth: Payer: Self-pay | Admitting: Family Medicine

## 2018-07-16 NOTE — Telephone Encounter (Signed)
Patient called and stated that since she has started taking there higher dosages of her medications she has experienced insomnia, patient states she bought melatonin gummy's from [facility] but that it is making it worst, please follow up.

## 2018-07-17 NOTE — Telephone Encounter (Signed)
Please advise.  Patient is referring to the Losartan 100 mg.

## 2018-07-18 MED ORDER — TRAZODONE HCL 50 MG PO TABS: 25.0000 mg | ORAL_TABLET | Freq: Every evening | ORAL | 3 refills | Status: DC | PRN

## 2018-07-18 NOTE — Telephone Encounter (Signed)
Advise patient I can send her in medication to help with sleep, trazodone 25-50 mg at bedtime as needed, if she would like medication for sleep.  I would not recommend continue with current blood pressure medications and treat the insomnia.  She should also avoid any caffeine or aggressive physical activity 2 to 3 hours prior to sleep.  If insomnia continues to be an issue even with medication we can discuss altering regimen.

## 2018-07-18 NOTE — Telephone Encounter (Signed)
Patient notified of the information below. Expressed understanding. States that she is agreeable to trying Trazodone to see if that will help with sleeping issues. Prescription sent to [facility] on file.

## 2018-08-09 ENCOUNTER — Telehealth: Payer: Self-pay

## 2018-08-09 NOTE — Telephone Encounter (Signed)
Called patient to do their pre-visit COVID screening.  Call went to voicemail. Unable to do prescreening.  

## 2018-08-12 ENCOUNTER — Encounter: Payer: Self-pay | Admitting: Family Medicine

## 2018-08-12 ENCOUNTER — Ambulatory Visit (INDEPENDENT_AMBULATORY_CARE_PROVIDER_SITE_OTHER): Payer: 59 | Admitting: Family Medicine

## 2018-08-12 ENCOUNTER — Other Ambulatory Visit: Payer: Self-pay

## 2018-08-12 VITALS — BP 144/97 | HR 77 | Temp 97.3°F | Resp 17 | Ht 71.0 in | Wt 358.0 lb

## 2018-08-12 DIAGNOSIS — I10 Essential (primary) hypertension: Secondary | ICD-10-CM

## 2018-08-12 DIAGNOSIS — F5101 Primary insomnia: Secondary | ICD-10-CM | POA: Diagnosis not present

## 2018-08-12 NOTE — Patient Instructions (Signed)
Insomnia Insomnia is a sleep disorder that makes it difficult to fall asleep or stay asleep. Insomnia can cause fatigue, low energy, difficulty concentrating, mood swings, and poor performance at work or school. There are three different ways to classify insomnia:  Difficulty falling asleep.  Difficulty staying asleep.  Waking up too early in the morning. Any type of insomnia can be long-term (chronic) or short-term (acute). Both are common. Short-term insomnia usually lasts for three months or less. Chronic insomnia occurs at least three times a week for longer than three months. What are the causes? Insomnia may be caused by another condition, situation, or substance, such as:  Anxiety.  Certain medicines.  Gastroesophageal reflux disease (GERD) or other gastrointestinal conditions.  Asthma or other breathing conditions.  Restless legs syndrome, sleep apnea, or other sleep disorders.  Chronic pain.  Menopause.  Stroke.  Abuse of alcohol, tobacco, or illegal drugs.  Mental health conditions, such as depression.  Caffeine.  Neurological disorders, such as Alzheimer's disease.  An overactive thyroid (hyperthyroidism). Sometimes, the cause of insomnia may not be known. What increases the risk? Risk factors for insomnia include:  Gender. Women are affected more often than men.  Age. Insomnia is more common as you get older.  Stress.  Lack of exercise.  Irregular work schedule or working night shifts.  Traveling between different time zones.  Certain medical and mental health conditions. What are the signs or symptoms? If you have insomnia, the main symptom is having trouble falling asleep or having trouble staying asleep. This may lead to other symptoms, such as:  Feeling fatigued or having low energy.  Feeling nervous about going to sleep.  Not feeling rested in the morning.  Having trouble concentrating.  Feeling irritable, anxious, or depressed. How  is this diagnosed? This condition may be diagnosed based on:  Your symptoms and medical history. Your health care provider may ask about: ? Your sleep habits. ? Any medical conditions you have. ? Your mental health.  A physical exam. How is this treated? Treatment for insomnia depends on the cause. Treatment may focus on treating an underlying condition that is causing insomnia. Treatment may also include:  Medicines to help you sleep.  Counseling or therapy.  Lifestyle adjustments to help you sleep better. Follow these instructions at home: Eating and drinking   Limit or avoid alcohol, caffeinated beverages, and cigarettes, especially close to bedtime. These can disrupt your sleep.  Do not eat a large meal or eat spicy foods right before bedtime. This can lead to digestive discomfort that can make it hard for you to sleep. Sleep habits   Keep a sleep diary to help you and your health care provider figure out what could be causing your insomnia. Write down: ? When you sleep. ? When you wake up during the night. ? How well you sleep. ? How rested you feel the next day. ? Any side effects of medicines you are taking. ? What you eat and drink.  Make your bedroom a dark, comfortable place where it is easy to fall asleep. ? Put up shades or blackout curtains to block light from outside. ? Use a white noise machine to block noise. ? Keep the temperature cool.  Limit screen use before bedtime. This includes: ? Watching TV. ? Using your smartphone, tablet, or computer.  Stick to a routine that includes going to bed and waking up at the same times every day and night. This can help you fall asleep faster. Consider   making a quiet activity, such as reading, part of your nighttime routine.  Try to avoid taking naps during the day so that you sleep better at night.  Get out of bed if you are still awake after 15 minutes of trying to sleep. Keep the lights down, but try reading or  doing a quiet activity. When you feel sleepy, go back to bed. General instructions  Take over-the-counter and prescription medicines only as told by your health care provider.  Exercise regularly, as told by your health care provider. Avoid exercise starting several hours before bedtime.  Use relaxation techniques to manage stress. Ask your health care provider to suggest some techniques that may work well for you. These may include: ? Breathing exercises. ? Routines to release muscle tension. ? Visualizing peaceful scenes.  Make sure that you drive carefully. Avoid driving if you feel very sleepy.  Keep all follow-up visits as told by your health care provider. This is important. Contact a health care provider if:  You are tired throughout the day.  You have trouble in your daily routine due to sleepiness.  You continue to have sleep problems, or your sleep problems get worse. Get help right away if:  You have serious thoughts about hurting yourself or someone else. If you ever feel like you may hurt yourself or others, or have thoughts about taking your own life, get help right away. You can go to your nearest emergency department or call:  Your local emergency services (911 in the [place]  A suicide crisis helpline, such as the [facility] at 970-041-7165. This is open 24 hours a day. Summary  Insomnia is a sleep disorder that makes it difficult to fall asleep or stay asleep.  Insomnia can be long-term (chronic) or short-term (acute).  Treatment for insomnia depends on the cause. Treatment may focus on treating an underlying condition that is causing insomnia.  Keep a sleep diary to help you and your health care provider figure out what could be causing your insomnia. This information is not intended to replace advice given to you by your health care provider. Make sure you discuss any questions you have with your health care provider. Document  Released: 01/07/2000 Document Revised: 12/22/2016 Document Reviewed: 10/19/2016 Elsevier Patient Education  2020 [employer].  Hypertension, Adult Hypertension is another name for high blood pressure. High blood pressure forces your heart to work harder to pump blood. This can cause problems over time. There are two numbers in a blood pressure reading. There is a top number (systolic) over a bottom number (diastolic). It is best to have a blood pressure that is below 120/80. Healthy choices can help lower your blood pressure, or you may need medicine to help lower it. What are the causes? The cause of this condition is not known. Some conditions may be related to high blood pressure. What increases the risk?  Smoking.  Having type 2 diabetes mellitus, high cholesterol, or both.  Not getting enough exercise or physical activity.  Being overweight.  Having too much fat, sugar, calories, or salt (sodium) in your diet.  Drinking too much alcohol.  Having long-term (chronic) kidney disease.  Having a family history of high blood pressure.  Age. Risk increases with age.  Race. You may be at higher risk if you are African American.  Gender. Men are at higher risk than women before age 72. After age 52, women are at higher risk than men.  Having obstructive sleep apnea.  Stress. What are the signs or symptoms?  High blood pressure may not cause symptoms. Very high blood pressure (hypertensive crisis) may cause: ? Headache. ? Feelings of worry or nervousness (anxiety). ? Shortness of breath. ? Nosebleed. ? A feeling of being sick to your stomach (nausea). ? Throwing up (vomiting). ? Changes in how you see. ? Very bad chest pain. ? Seizures. How is this treated?  This condition is treated by making healthy lifestyle changes, such as: ? Eating healthy foods. ? Exercising more. ? Drinking less alcohol.  Your health care provider may prescribe medicine if lifestyle changes  are not enough to get your blood pressure under control, and if: ? Your top number is above 130. ? Your bottom number is above 80.  Your personal target blood pressure may vary. Follow these instructions at home: Eating and drinking   If told, follow the DASH eating plan. To follow this plan: ? Fill one half of your plate at each meal with fruits and vegetables. ? Fill one fourth of your plate at each meal with whole grains. Whole grains include whole-wheat pasta, brown rice, and whole-grain bread. ? Eat or drink low-fat dairy products, such as skim milk or low-fat yogurt. ? Fill one fourth of your plate at each meal with low-fat (lean) proteins. Low-fat proteins include fish, chicken without skin, eggs, beans, and tofu. ? Avoid fatty meat, cured and processed meat, or chicken with skin. ? Avoid pre-made or processed food.  Eat less than 1,500 mg of salt each day.  Do not drink alcohol if: ? Your doctor tells you not to drink. ? You are pregnant, may be pregnant, or are planning to become pregnant.  If you drink alcohol: ? Limit how much you use to:  0-1 drink a day for women.  0-2 drinks a day for men. ? Be aware of how much alcohol is in your drink. In the [place] one drink equals one 12 oz bottle of beer (355 mL), one 5 oz glass of wine (148 mL), or one 1 oz glass of hard liquor (44 mL). Lifestyle   Work with your doctor to stay at a healthy weight or to lose weight. Ask your doctor what the best weight is for you.  Get at least 30 minutes of exercise most days of the week. This may include walking, swimming, or biking.  Get at least 30 minutes of exercise that strengthens your muscles (resistance exercise) at least 3 days a week. This may include lifting weights or doing Pilates.  Do not use any products that contain nicotine or tobacco, such as cigarettes, e-cigarettes, and chewing tobacco. If you need help quitting, ask your doctor.  Check your blood pressure at home as  told by your doctor.  Keep all follow-up visits as told by your doctor. This is important. Medicines  Take over-the-counter and prescription medicines only as told by your doctor. Follow directions carefully.  Do not skip doses of blood pressure medicine. The medicine does not work as well if you skip doses. Skipping doses also puts you at risk for problems.  Ask your doctor about side effects or reactions to medicines that you should watch for. Contact a doctor if you:  Think you are having a reaction to the medicine you are taking.  Have headaches that keep coming back (recurring).  Feel dizzy.  Have swelling in your ankles.  Have trouble with your vision. Get help right away if you:  Get a very bad headache.  Start to feel mixed up (confused).  Feel weak or numb.  Feel faint.  Have very bad pain in your: ? Chest. ? Belly (abdomen).  Throw up more than once.  Have trouble breathing. Summary  Hypertension is another name for high blood pressure.  High blood pressure forces your heart to work harder to pump blood.  For most people, a normal blood pressure is less than 120/80.  Making healthy choices can help lower blood pressure. If your blood pressure does not get lower with healthy choices, you may need to take medicine. This information is not intended to replace advice given to you by your health care provider. Make sure you discuss any questions you have with your health care provider. Document Released: 06/28/2007 Document Revised: 09/19/2017 Document Reviewed: 09/19/2017 Elsevier Patient Education  2020 [employer].

## 2018-08-12 NOTE — Progress Notes (Signed)
Established Patient Office Visit  Subjective:  Patient ID: Whitney [name], female    DOB: 10-07-1985  Age: 33 y.o. MRN: [id number]  CC:  Chief Complaint  Patient presents with  . Hypertension    HPI Whitney [name] presents for follow-up of hypertension.  Patient also with history of DVT and pulmonary embolism for which she is currently on anticoagulant medication.  She reports no unusual bruising or bleeding.  No chest pain and no increased shortness of breath.  No leg pain.  She has some occasional swelling in her lower legs by the end of the day.  She reports that she was supposed to fly to [facility] in September and she and her PCP had discussed having patient remain on anticoagulant medication until she returns from her trip.  Patient states that her blood pressure has been slightly elevated by her home monitor.  She currently checks her blood pressure with a wrist cuff.  She reports compliance with her blood pressure medications but believes that her blood pressure is elevated today because she did not fall asleep until about 4 AM this morning.  She reports longstanding issues with insomnia.  She has tried over-the-counter melatonin without success.  She was also prescribed a medication but only tried it once and she did not feel that it helped.  She states that she will try to fall asleep on her own and by the time she realizes that she is not going to fall asleep, she is afraid to take any medication that would then cause her to oversleep.  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  She states that she often does not attempt to go to sleep until about midnight and then needs to get up by 9 AM.  She does not believe that she snores.  She does have daytime fatigue but she believes that this is related to her lack of sleep at night.  She denies any morning headaches upon  awakening.  Past Medical History:  Diagnosis Date  . Diabetes mellitus without complication ([place])   . Gallstones   . Headache(784.0)   . No pertinent past medical history     Past Surgical History:  Procedure Laterality Date  . CHOLECYSTECTOMY N/A 03/12/2012   Procedure: LAPAROSCOPIC CHOLECYSTECTOMY;  Surgeon: Harl Bowie, MD;  Location: [facility] OR;  Service: General;  Laterality: N/A;    Family History  Problem Relation Age of Onset  . Healthy Mother   . Ulcerative colitis Father   . Diabetes Maternal Grandmother   . Breast cancer Maternal Grandmother   . Syncope episode Maternal Grandfather   . Benign prostatic hyperplasia Maternal Grandfather   . Brain cancer Paternal Grandmother   . Lung cancer Paternal Grandfather   . Anesthesia problems Neg Hx   . Hypotension Neg Hx   . Malignant hyperthermia Neg Hx   . Pseudochol deficiency Neg Hx     Social History   Tobacco Use  . Smoking status: Former Smoker    Types: Cigarettes    Quit date: 05/24/2011    Years since quitting: 7.2  . Smokeless tobacco: Never Used  Substance Use Topics  . Alcohol use: Yes    Comment: Once a month on average  . Drug use: No    Outpatient Medications Prior to Visit  Medication Sig Dispense Refill  . Biotin 1 MG  CAPS Take 1 mg by mouth daily.     . hydrochlorothiazide (HYDRODIURIL) 25 MG tablet Take 1 tablet (25 mg total) by mouth daily. 90 tablet 3  . losartan (COZAAR) 50 MG tablet Take 2 tablets (100 mg total) by mouth daily. 60 tablet 3  . metFORMIN (GLUCOPHAGE) 1000 MG tablet Take 1 tablet (1,000 mg total) by mouth 2 (two) times daily with a meal. 60 tablet 3  . rivaroxaban (XARELTO) 20 MG TABS tablet Take 1 tablet (20 mg total) by mouth daily with supper. 30 tablet 4  . traZODone (DESYREL) 50 MG tablet Take 0.5-1 tablets (25-50 mg total) by mouth at bedtime as needed for sleep. 30 tablet 3  . Prenatal Vit-Fe Fumarate-FA (PRENATAL MULTIVITAMIN) TABS Take 1 tablet by mouth daily.      No facility-administered medications prior to visit.     No Known Allergies  ROS Review of Systems  Constitutional: Positive for fatigue. Negative for chills and fever.  HENT: Negative for nosebleeds, sore throat and trouble swallowing.   Eyes: Negative for photophobia and visual disturbance.  Respiratory: Negative for cough and shortness of breath.   Cardiovascular: Positive for leg swelling (occasional, mild ). Negative for chest pain and palpitations.  Gastrointestinal: Negative for abdominal pain, constipation, diarrhea and nausea.  Endocrine: Negative for polydipsia, polyphagia and polyuria.  Genitourinary: Negative for dysuria and frequency.  Musculoskeletal: Negative for arthralgias, back pain and gait problem.  Neurological: Negative for dizziness and headaches.  Hematological: Negative for adenopathy. Does not bruise/bleed easily.  Psychiatric/Behavioral: Positive for sleep disturbance. Negative for self-injury and suicidal ideas.      Objective:    Physical Exam  Constitutional: She is oriented to person, place, and time. She appears well-developed and well-nourished.  Obese female in NAD  Neck: Normal range of motion. Neck supple. No JVD present.  Cardiovascular: Normal rate and regular rhythm.  Pulmonary/Chest: Effort normal and breath sounds normal.  Abdominal: Soft. There is no abdominal tenderness. There is no rebound and no guarding.  Musculoskeletal:        General: No tenderness or edema.     Comments: No calf tenderness and no peripheral edema on today's exam  Lymphadenopathy:    She has no cervical adenopathy.  Neurological: She is alert and oriented to person, place, and time.  Skin: Skin is warm and dry.  Psychiatric: She has a normal mood and affect. Her behavior is normal.  Nursing note and vitals reviewed.   BP (!) 144/97   Pulse 77   Temp (!) 97.3 F (36.3 C) (Temporal)   Resp 17   Ht 5\' 11"  (1.803 m)   Wt (!) 358 lb (162.4 kg)   SpO2 96%    BMI 49.93 kg/m  Wt Readings from Last 3 Encounters:  08/12/18 (!) 358 lb (162.4 kg)  07/01/18 (!) 366 lb 6.4 oz (166.2 kg)  05/22/18 (!) 372 lb 3.2 oz (168.8 kg)     Health Maintenance Due  Topic Date Due  . FOOT EXAM  01/27/2017    There are no preventive care reminders to display for this patient.  Lab Results  Component Value Date   TSH 1.993 04/14/2018   Lab Results  Component Value Date   WBC 8.3 05/22/2018   HGB 13.1 05/22/2018   HCT 39.5 05/22/2018   MCV 83 05/22/2018   PLT 303 05/22/2018   Lab Results  Component Value Date   NA 138 05/22/2018   K 4.0 05/22/2018   CO2 20 05/22/2018  GLUCOSE 204 (H) 05/22/2018   BUN 8 05/22/2018   CREATININE 0.73 05/22/2018   BILITOT <0.2 05/22/2018   ALKPHOS 57 05/22/2018   AST 19 05/22/2018   ALT 44 (H) 05/22/2018   PROT 6.2 05/22/2018   ALBUMIN 3.7 (L) 05/22/2018   CALCIUM 9.8 05/22/2018   ANIONGAP 9 04/14/2018   GFR 102.95 12/27/2015   Lab Results  Component Value Date   CHOL 137 05/22/2018   Lab Results  Component Value Date   HDL 42 05/22/2018   Lab Results  Component Value Date   LDLCALC 73 05/22/2018   Lab Results  Component Value Date   TRIG 112 05/22/2018   Lab Results  Component Value Date   CHOLHDL 3.3 05/22/2018   Lab Results  Component Value Date   HGBA1C 7.8 (H) 05/22/2018      Assessment & Plan:  1. Essential hypertension Discussed with patient that her blood pressure is not at goal of 130/80 as patient is also diabetic.  Patient does not wish to increase her dose of losartan from 50 to 100 mg at this time.  Patient reports that she did not fall asleep until 4:30 AM last night which may also be contributing to her elevated blood pressure.  Patient would like to see if she can improve her sleep and return for blood pressure recheck prior to making changes in her medication.  2. Primary insomnia Sleep hygiene including setting a regular bedtime and regular wake-up time discussed with  the patient.  She reports the use of melatonin without success but does not think that she took the medication more than twice.  Patient also appears to have been prescribed trazodone in the past but again only took this medication once.  Discussed with patient that she may either want to try the trazodone about 1/2-hour prior to bedtime to see if this helps and to try it for a few nights in a row.  She can also try use of melatonin for 5-7 nights to see if this medication is effective.  Patient does not believe that she has any issues with sleep apnea that this was also discussed as a possibility.  Patient has tried over-the-counter Z quill in the past and I discussed with the patient that this medication is equivalent to 25 mg or more of Benadryl.  Patient states that the medication did make her extremely sleepy including the next morning.  Patient may try over-the-counter children's Benadryl at 12.5 mg if no other medications help with her sleep.  An After Visit Summary was printed and given to the patient.   Follow-up: Return in about 4 weeks (around 09/09/2018) for Hypertension; DM; Insomnia.   Antony Blackbird, MD

## 2018-09-02 ENCOUNTER — Ambulatory Visit: Payer: 59 | Admitting: Family Medicine

## 2018-09-06 ENCOUNTER — Telehealth: Payer: Self-pay

## 2018-09-06 NOTE — Telephone Encounter (Signed)
Called patient to do their pre-visit COVID screening.  Call went to voicemail. Unable to do prescreening.  

## 2018-09-09 ENCOUNTER — Encounter: Payer: Self-pay | Admitting: Family Medicine

## 2018-09-09 ENCOUNTER — Other Ambulatory Visit: Payer: Self-pay

## 2018-09-09 ENCOUNTER — Ambulatory Visit (INDEPENDENT_AMBULATORY_CARE_PROVIDER_SITE_OTHER): Payer: 59 | Admitting: Family Medicine

## 2018-09-09 VITALS — BP 161/106 | HR 90 | Temp 97.3°F | Resp 17 | Ht 71.0 in | Wt 361.0 lb

## 2018-09-09 DIAGNOSIS — Z7901 Long term (current) use of anticoagulants: Secondary | ICD-10-CM

## 2018-09-09 DIAGNOSIS — F5101 Primary insomnia: Secondary | ICD-10-CM | POA: Diagnosis not present

## 2018-09-09 DIAGNOSIS — I10 Essential (primary) hypertension: Secondary | ICD-10-CM | POA: Diagnosis not present

## 2018-09-09 DIAGNOSIS — E119 Type 2 diabetes mellitus without complications: Secondary | ICD-10-CM | POA: Diagnosis not present

## 2018-09-09 DIAGNOSIS — Z6841 Body Mass Index (BMI) 40.0 and over, adult: Secondary | ICD-10-CM

## 2018-09-09 DIAGNOSIS — Z86711 Personal history of pulmonary embolism: Secondary | ICD-10-CM

## 2018-09-09 DIAGNOSIS — Z86718 Personal history of other venous thrombosis and embolism: Secondary | ICD-10-CM

## 2018-09-09 MED ORDER — AMLODIPINE BESYLATE 5 MG PO TABS: 5.0000 mg | ORAL_TABLET | Freq: Every day | ORAL | 3 refills | Status: DC

## 2018-09-09 NOTE — Progress Notes (Signed)
States that BP at home has been running ok. Was up last week 170s-160s/90s-100s but normalized. No c/o headaches, dizziness, chest pain, SHOB.  Takes BP medication at night.

## 2018-09-09 NOTE — Progress Notes (Signed)
Established Patient Office Visit  Subjective:  Patient ID: Whitney [name], female    DOB: January 06, 1986  Age: 33 y.o. MRN: [id number]  CC:  Chief Complaint  Patient presents with  . Diabetes  . Hypertension  . Insomnia    HPI Whitney [name] presents for follow-up of diabetes, hypertension and insomnia.  Patient was recently seen in the office in follow-up of hypertension/elevated blood pressure and she did not wish to add any additional medications for the treatment of her blood pressure.  Patient's last hemoglobin A1c was 7.8 on 05/22/2018.        Patient reports that she thinks that her blood pressure is elevated today because she only got a few hours of sleep last night.  Patient states that actually after her last visit she took Z quill x1 night and after that was able to fall asleep on her own until last night when she again had difficulty sleeping.  She believes that she cannot sleep last night because she was slightly anxious as she had received her paper work in the mail this weekend and will start her college classes tomorrow online.  Patient states that she also has some increased stress between her son being back in school and the fact that she will start school this week.  She denies any headaches or dizziness related to her blood pressure          She reports that she does not check her home blood sugars and she states that she was told not to stick her fingers to check her blood sugar after she was placed on blood thinning medication for treatment of pulmonary embolism.  She denies urinary frequency but has some occasional increased thirst.  She denies any numbness or tingling in her hands or feet.  She is currently taking metformin for treatment of her diabetes.          Patient currently remains on Xarelto 20 mg daily for treatment of pulmonary embolism.  Patient denies any unusual bruising or bleeding.  She denies blood in the stool, no nosebleeds no blood in the urine and no blood when  brushing her teeth.  She has had no further chest pain or increased shortness of breath.  Past Medical History:  Diagnosis Date  . Diabetes mellitus without complication ([place])   . Gallstones   . Headache(784.0)   . No pertinent past medical history     Past Surgical History:  Procedure Laterality Date  . CHOLECYSTECTOMY N/A 03/12/2012   Procedure: LAPAROSCOPIC CHOLECYSTECTOMY;  Surgeon: Harl Bowie, MD;  Location: [facility] OR;  Service: General;  Laterality: N/A;    Family History  Problem Relation Age of Onset  . Healthy Mother   . Ulcerative colitis Father   . Diabetes Maternal Grandmother   . Breast cancer Maternal Grandmother   . Syncope episode Maternal Grandfather   . Benign prostatic hyperplasia Maternal Grandfather   . Brain cancer Paternal Grandmother   . Lung cancer Paternal Grandfather   . Anesthesia problems Neg Hx   . Hypotension Neg Hx   . Malignant hyperthermia Neg Hx   . Pseudochol deficiency Neg Hx     Social History   Tobacco Use  . Smoking status: Former Smoker    Types: Cigarettes    Quit date: 05/24/2011    Years since quitting: 7.3  . Smokeless tobacco: Never Used  Substance Use Topics  . Alcohol use: Yes    Comment: Once a month on average  .  Drug use: No    Outpatient Medications Prior to Visit  Medication Sig Dispense Refill  . Biotin 1 MG CAPS Take 1 mg by mouth daily.     . hydrochlorothiazide (HYDRODIURIL) 25 MG tablet Take 1 tablet (25 mg total) by mouth daily. 90 tablet 3  . losartan (COZAAR) 50 MG tablet Take 2 tablets (100 mg total) by mouth daily. 60 tablet 3  . metFORMIN (GLUCOPHAGE) 1000 MG tablet Take 1 tablet (1,000 mg total) by mouth 2 (two) times daily with a meal. 60 tablet 3  . rivaroxaban (XARELTO) 20 MG TABS tablet Take 1 tablet (20 mg total) by mouth daily with supper. 30 tablet 4  . traZODone (DESYREL) 50 MG tablet Take 0.5-1 tablets (25-50 mg total) by mouth at bedtime as needed for sleep. 30 tablet 3   No  facility-administered medications prior to visit.     No Known Allergies  ROS Review of Systems  Constitutional: Positive for fatigue (Due to poor sleep overnight). Negative for chills and fever.  HENT: Negative for nosebleeds, sore throat and trouble swallowing.   Eyes: Negative for photophobia and visual disturbance.  Respiratory: Negative for cough and shortness of breath.   Cardiovascular: Negative for chest pain, palpitations and leg swelling.  Gastrointestinal: Negative for abdominal pain, anal bleeding, blood in stool, constipation, diarrhea and nausea.  Endocrine: Positive for polydipsia (occasional). Negative for polyphagia and polyuria.  Genitourinary: Negative for dysuria, frequency and hematuria.  Musculoskeletal: Negative for arthralgias and back pain.  Skin: Negative for rash and wound.  Neurological: Negative for dizziness and headaches.  Hematological: Negative for adenopathy. Does not bruise/bleed easily.      Objective:    Physical Exam  Constitutional: She is oriented to person, place, and time. She appears well-developed and well-nourished.  Morbidly obese young adult female in no acute distress  Neck: Normal range of motion. Neck supple. No JVD present.  Cardiovascular: Normal rate and regular rhythm.  Pulmonary/Chest: Effort normal and breath sounds normal.  Abdominal: Soft. There is no abdominal tenderness. There is no rebound and no guarding.  Musculoskeletal:        General: No tenderness or edema.     Comments: No CVA tenderness; patient with bilateral appearance of lower extremity edema but no actual edema on palpation and leg size/circumference appears equal; no tenderness to palpation of posterior calves  Lymphadenopathy:    She has no cervical adenopathy.  Neurological: She is alert and oriented to person, place, and time.  Skin: Skin is warm and dry.  And diabetic foot exam, patient with some areas of thickened skin/callus on the right bottom of  the foot beneath the fourth and fifth toes.  Patient has decreased monofilament sensation in this area as well as decreased monofilament sensation on the plantar surface of the great toes bilaterally.  No active skin breakdown on the feet  Psychiatric: She has a normal mood and affect. Her behavior is normal.  Nursing note and vitals reviewed. Diabetic foot exam: No active skin breakdown on the feet.  Patient does have thickened skin/callus on the plantar surface of the foot below the fourth and fifth toes on the right.  Patient also with decreased monofilament sensation on the plantar surfaces of the great toes bilaterally as well as on the area of thickened skin/callus below the right fourth and fifth toes.  Patient with 1+ dorsalis pedis and posterior tibial pulses by palpation.  Toenails are within normal, no thickening and nails are trimmed to appropriate  length.  BP (!) 161/106   Pulse 90   Temp (!) 97.3 F (36.3 C) (Temporal)   Resp 17   Ht 5\' 11"  (1.803 m)   Wt (!) 361 lb (163.7 kg)   SpO2 96%   BMI 50.35 kg/m  Wt Readings from Last 3 Encounters:  09/09/18 (!) 361 lb (163.7 kg)  08/12/18 (!) 358 lb (162.4 kg)  07/01/18 (!) 366 lb 6.4 oz (166.2 kg)     Health Maintenance Due  Topic Date Due  . FOOT EXAM  01/27/2017  . INFLUENZA VACCINE  08/24/2018   Diabetic foot exam done at today's visit.  Patient encouraged to schedule appointment for nurse visit and late September/early October for influenza immunization   Lab Results  Component Value Date   TSH 1.993 04/14/2018   Lab Results  Component Value Date   WBC 8.3 05/22/2018   HGB 13.1 05/22/2018   HCT 39.5 05/22/2018   MCV 83 05/22/2018   PLT 303 05/22/2018   Lab Results  Component Value Date   NA 138 05/22/2018   K 4.0 05/22/2018   CO2 20 05/22/2018   GLUCOSE 204 (H) 05/22/2018   BUN 8 05/22/2018   CREATININE 0.73 05/22/2018   BILITOT <0.2 05/22/2018   ALKPHOS 57 05/22/2018   AST 19 05/22/2018   ALT 44 (H)  05/22/2018   PROT 6.2 05/22/2018   ALBUMIN 3.7 (L) 05/22/2018   CALCIUM 9.8 05/22/2018   ANIONGAP 9 04/14/2018   GFR 102.95 12/27/2015   Lab Results  Component Value Date   CHOL 137 05/22/2018   Lab Results  Component Value Date   HDL 42 05/22/2018   Lab Results  Component Value Date   LDLCALC 73 05/22/2018   Lab Results  Component Value Date   TRIG 112 05/22/2018   Lab Results  Component Value Date   CHOLHDL 3.3 05/22/2018   Lab Results  Component Value Date   HGBA1C 7.8 (H) 05/22/2018      Assessment & Plan:  1. Type 2 diabetes mellitus without complication, without long-term current use of insulin ([place]) Patient is currently on metformin and her last hemoglobin A1c in April was 7.8.  She reports that she was told at the emergency department not to check her blood sugars since she is on blood thinning medication therefore she is not sure how her blood sugars have been running at home.  Patient will have hemoglobin A1c and BMP and will be notified if change in therapy is needed.  Patient is encouraged to continue low carbohydrate diet and regular cardiovascular exercise.  Patient will also be notified of sooner follow-up as needed based on her lab results. - Hemoglobin O9B - Basic Metabolic Panel  2. Essential hypertension Patient's blood pressure continues to be elevated at today's visit.  Patient will continue losartan for which she is currently 100 mg as she is taking 2 of the 50 mg pills and she will also continue hydrochlorothiazide.  Educational information on hypertension as well as DASH diet provided as part of the after visit summary-AVS.  Patient is also encouraged to return to clinic in 2 weeks for nurse visit blood pressure recheck. - amLODipine (NORVASC) 5 MG tablet; Take 1 tablet (5 mg total) by mouth daily. To lower blood sugar  Dispense: 90 tablet; Refill: 3  3. Primary insomnia She reports that she actually had improvement in insomnia after 1 night of  taking Z quill until her difficulty with sleep last night.  Sleep hygiene discussed  with the patient as she does have prescription given at her last visit for trazodone if needed to help with sleep.  4. Long term current use of anticoagulant therapy;5.  History of pulmonary embolism;6 morbid obesity history of lower extremity DVT Patient is currently on Xarelto 20 mg daily secondary to prior pulmonary embolism and patient will have CBC at today's visit in follow-up of her use of anticoagulant medicine to look for anemia or platelet disorder as a result of her use of anticoagulant medication.  Will obtain input from clinical pharmacist regarding discontinuation of her current Xarelto.  Patient will be notified.  Patient was admitted 04/13/2018 through 04/15/2018 due to submassive bilateral pulmonary embolus and DVT of the left popliteal vein and left posterior tibial vein.  Therapy was recommended for 90 days which patient has exceeded but believes that Xarelto therapy was extended due to a planned trip which did not occur. Patient will be contacted and if she is no longer planning to travel in the near future then her Xarelto can be stopped. Spoke with patient and she is driving to [place], [place] at the end of the month but is not still planning to travel in September. Discussed with patient to continue her Louanna Raw through her trip and for 1 week after she returns and she can then stop Xarelto.  - CBC with Differential  7.  Morbid obesity with BMI 50 Healthy diet and increase physical activity with goal of weight loss encouraged  An After Visit Summary was printed and given to the patient.   Follow-up: Return in about 3 months (around 12/10/2018) for HTN- 2 week nurse visit BP check; .   Antony Blackbird, MD

## 2018-09-09 NOTE — Patient Instructions (Signed)

## 2018-09-10 ENCOUNTER — Other Ambulatory Visit: Payer: Self-pay | Admitting: Family Medicine

## 2018-09-10 LAB — BASIC METABOLIC PANEL WITH GFR
BUN/Creatinine Ratio: 11 (ref 9–23)
BUN: 9 mg/dL (ref 6–20)
CO2: 22 mmol/L (ref 20–29)
Calcium: 10.9 mg/dL — ABNORMAL HIGH (ref 8.7–10.2)
Chloride: 104 mmol/L (ref 96–106)
Creatinine, Ser: 0.85 mg/dL (ref 0.57–1.00)
GFR calc Af Amer: 104 mL/min/1.73
GFR calc non Af Amer: 90 mL/min/1.73
Glucose: 178 mg/dL — ABNORMAL HIGH (ref 65–99)
Potassium: 3.9 mmol/L (ref 3.5–5.2)
Sodium: 139 mmol/L (ref 134–144)

## 2018-09-10 LAB — CBC WITH DIFFERENTIAL/PLATELET
Basophils Absolute: 0.1 x10E3/uL (ref 0.0–0.2)
Basos: 1 %
EOS (ABSOLUTE): 0.1 x10E3/uL (ref 0.0–0.4)
Eos: 2 %
Hematocrit: 42.1 % (ref 34.0–46.6)
Hemoglobin: 13.9 g/dL (ref 11.1–15.9)
Immature Grans (Abs): 0 x10E3/uL (ref 0.0–0.1)
Immature Granulocytes: 0 %
Lymphocytes Absolute: 2 x10E3/uL (ref 0.7–3.1)
Lymphs: 25 %
MCH: 27.7 pg (ref 26.6–33.0)
MCHC: 33 g/dL (ref 31.5–35.7)
MCV: 84 fL (ref 79–97)
Monocytes Absolute: 0.6 x10E3/uL (ref 0.1–0.9)
Monocytes: 7 %
Neutrophils Absolute: 5.1 x10E3/uL (ref 1.4–7.0)
Neutrophils: 65 %
Platelets: 287 x10E3/uL (ref 150–450)
RBC: 5.01 x10E6/uL (ref 3.77–5.28)
RDW: 12.8 % (ref 11.7–15.4)
WBC: 7.9 x10E3/uL (ref 3.4–10.8)

## 2018-09-10 LAB — HEMOGLOBIN A1C
Est. average glucose Bld gHb Est-mCnc: 174 mg/dL
Hgb A1c MFr Bld: 7.7 % — ABNORMAL HIGH (ref 4.8–5.6)

## 2018-09-10 NOTE — Progress Notes (Signed)
Patient ID: Whitney [name], female   DOB: [birth year], 33 y.o.   MRN: [id number]   Patient with a calcium level of 10.9 on recent BMP and has been asked to return for blood work in follow-up. Will place order for PTH and calcium level.

## 2018-09-11 NOTE — Progress Notes (Signed)
Patient notified of results & recommendations. Expressed understanding.

## 2018-09-23 ENCOUNTER — Ambulatory Visit: Payer: 59

## 2018-09-23 ENCOUNTER — Other Ambulatory Visit: Payer: Self-pay

## 2018-09-23 VITALS — BP 137/78 | HR 84

## 2018-09-23 DIAGNOSIS — I10 Essential (primary) hypertension: Secondary | ICD-10-CM

## 2018-09-23 NOTE — Progress Notes (Signed)
Patient here for BP check. After sitting BP was 137/78, pulse was 84  States that readings at home have been in the 130s/80s-90s

## 2018-10-23 ENCOUNTER — Ambulatory Visit: Payer: 59

## 2018-10-28 ENCOUNTER — Encounter (HOSPITAL_COMMUNITY): Payer: Self-pay | Admitting: Emergency Medicine

## 2018-10-28 ENCOUNTER — Emergency Department (HOSPITAL_COMMUNITY)
Admission: EM | Admit: 2018-10-28 | Discharge: 2018-10-28 | Disposition: A | Discharge status: Home / Self Care | Payer: 59 | Attending: Emergency Medicine | Admitting: Emergency Medicine

## 2018-10-28 ENCOUNTER — Emergency Department (HOSPITAL_COMMUNITY): Payer: 59

## 2018-10-28 ENCOUNTER — Emergency Department (EMERGENCY_DEPARTMENT_HOSPITAL): Payer: 59

## 2018-10-28 ENCOUNTER — Other Ambulatory Visit: Payer: Self-pay

## 2018-10-28 DIAGNOSIS — Z7984 Long term (current) use of oral hypoglycemic drugs: Secondary | ICD-10-CM | POA: Insufficient documentation

## 2018-10-28 DIAGNOSIS — E119 Type 2 diabetes mellitus without complications: Secondary | ICD-10-CM | POA: Diagnosis not present

## 2018-10-28 DIAGNOSIS — I82412 Acute embolism and thrombosis of left femoral vein: Secondary | ICD-10-CM | POA: Insufficient documentation

## 2018-10-28 DIAGNOSIS — M79662 Pain in left lower leg: Secondary | ICD-10-CM | POA: Diagnosis not present

## 2018-10-28 DIAGNOSIS — Z87891 Personal history of nicotine dependence: Secondary | ICD-10-CM | POA: Insufficient documentation

## 2018-10-28 DIAGNOSIS — Z86711 Personal history of pulmonary embolism: Secondary | ICD-10-CM | POA: Insufficient documentation

## 2018-10-28 DIAGNOSIS — Z79899 Other long term (current) drug therapy: Secondary | ICD-10-CM | POA: Diagnosis not present

## 2018-10-28 DIAGNOSIS — M79605 Pain in left leg: Secondary | ICD-10-CM | POA: Diagnosis present

## 2018-10-28 LAB — CBC
HCT: 43 % (ref 36.0–46.0)
Hemoglobin: 14.9 g/dL (ref 12.0–15.0)
MCH: 28.2 pg (ref 26.0–34.0)
MCHC: 34.7 g/dL (ref 30.0–36.0)
MCV: 81.4 fL (ref 80.0–100.0)
Platelets: 295 10*3/uL (ref 150–400)
RBC: 5.28 MIL/uL — ABNORMAL HIGH (ref 3.87–5.11)
RDW: 11.9 % (ref 11.5–15.5)
WBC: 7.1 10*3/uL (ref 4.0–10.5)
nRBC: 0 % (ref 0.0–0.2)

## 2018-10-28 LAB — COMPREHENSIVE METABOLIC PANEL
ALT: 73 U/L — ABNORMAL HIGH (ref 0–44)
AST: 73 U/L — ABNORMAL HIGH (ref 15–41)
Albumin: 3.9 g/dL (ref 3.5–5.0)
Alkaline Phosphatase: 68 U/L (ref 38–126)
Anion gap: 13 (ref 5–15)
BUN: 8 mg/dL (ref 6–20)
CO2: 23 mmol/L (ref 22–32)
Calcium: 10.5 mg/dL — ABNORMAL HIGH (ref 8.9–10.3)
Chloride: 101 mmol/L (ref 98–111)
Creatinine, Ser: 0.88 mg/dL (ref 0.44–1.00)
GFR calc Af Amer: >60 mL/min (ref >60)
GFR calc non Af Amer: >60 mL/min (ref >60)
Glucose, Bld: 308 mg/dL — ABNORMAL HIGH (ref 70–99)
Potassium: 3.9 mmol/L (ref 3.5–5.1)
Sodium: 137 mmol/L (ref 135–145)
Total Bilirubin: 0.5 mg/dL (ref 0.3–1.2)
Total Protein: 7.4 g/dL (ref 6.5–8.1)

## 2018-10-28 LAB — I-STAT BETA HCG BLOOD, ED (MC, WL, AP ONLY): I-stat hCG, quantitative: <5 m[IU]/mL (ref <5)

## 2018-10-28 MED ORDER — IOHEXOL 350 MG/ML SOLN
100.0000 mL | Freq: Once | INTRAVENOUS | Status: AC | PRN
  Administered 2018-10-28: 100 mL via INTRAVENOUS

## 2018-10-28 MED ORDER — RIVAROXABAN (XARELTO) VTE STARTER PACK (15 & 20 MG): ORAL_TABLET | ORAL | 0 refills | Status: DC

## 2018-10-28 NOTE — ED Triage Notes (Signed)
Pt here bc she thinks that she has blood clots in left leg again like she did back in march. Pt states her leg feels hot, sore to walk on. Pt just came off of blood thinner little less then a month ago. Pt was on xarelto for 6 months. Her PCP took her off after the 6 months.

## 2018-10-28 NOTE — ED Provider Notes (Signed)
[facility] EMERGENCY DEPARTMENT Provider Note   CSN: [id number] Arrival date & time: 10/28/18  1537     History   Chief Complaint Chief Complaint  Patient presents with   Leg Pain   DVT    HPI Whitney [name] is a 33 y.o. female.     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 She presents today with a new DVT on imaging. She endorses a nonproductive cough for the past 10 days but currently denies chest pain or dyspnea.   Past Medical History:  Diagnosis Date   Diabetes mellitus without complication ([place])    DVT (deep venous thrombosis) ([place]) 03/2018   Gallstones    Headache(784.0)    No pertinent past medical history     Patient Active Problem List   Diagnosis Date Noted   Acute deep vein thrombosis (DVT) of popliteal vein of left lower extremity ([place])    Essential hypertension    Pulmonary emboli ([place]) 04/13/2018   Type 2 diabetes mellitus ([place]) 01/17/2016   Morbid obesity ([place]) 12/27/2015   Gallstone pancreatitis 03/11/2012   Abdominal pain, acute 03/11/2012   Group B streptococcal infection in pregnancy 01/27/2012    Past Surgical History:  Procedure Laterality Date   CHOLECYSTECTOMY N/A 03/12/2012   Procedure: LAPAROSCOPIC CHOLECYSTECTOMY;  Surgeon: Harl Bowie, MD;  Location: [facility];  Service: General;  Laterality: N/A;     OB History    Gravida  1   Para  1   Term  1   Preterm  0   AB  0   Living  1     SAB  0   TAB  0   Ectopic  0   Multiple  0   Live Births  1            Home Medications    Prior to Admission medications   Medication Sig Start Date End Date Taking? Authorizing Provider  amLODipine (NORVASC) 5 MG tablet Take 1 tablet (5 mg total) by mouth daily. To lower blood sugar Patient taking differently: Take 5 mg by mouth daily with  supper. To lower blood pressure 09/09/18  Yes Fulp, Cammie, MD  Biotin 1 MG CAPS Take 1 mg by mouth daily with breakfast.    Yes [provider]  hydrochlorothiazide (HYDRODIURIL) 25 MG tablet Take 1 tablet (25 mg total) by mouth daily. Patient taking differently: Take 25 mg by mouth daily with supper.  06/10/18  Yes Scot Jun, FNP  losartan (COZAAR) 50 MG tablet Take 2 tablets (100 mg total) by mouth daily. Patient taking differently: Take 100 mg by mouth daily with supper.  06/10/18  Yes Scot Jun, FNP  metFORMIN (GLUCOPHAGE) 1000 MG tablet Take 1 tablet (1,000 mg total) by mouth 2 (two) times daily with a meal. Patient taking differently: Take 200 mg by mouth daily with supper.  05/28/18  Yes Scot Jun, FNP  Multiple Vitamins-Minerals (HAIR/SKIN/NAILS) TABS Take 1 tablet by mouth daily with breakfast.   Yes [provider]  Rivaroxaban 15 & 20 MG TBPK Follow package directions: Take one 15mg  tablet by mouth twice a day. On day 22, switch to one 20mg  tablet once a day. Take with food. 10/28/18   Regan Lemming, MD  traZODone (DESYREL) 50 MG tablet  Take 0.5-1 tablets (25-50 mg total) by mouth at bedtime as needed for sleep. Patient not taking: Reported on 10/28/2018 07/18/18   Scot Jun, FNP    Family History Family History  Problem Relation Age of Onset   Healthy Mother    Ulcerative colitis Father    Diabetes Maternal Grandmother    Breast cancer Maternal Grandmother    Syncope episode Maternal Grandfather    Benign prostatic hyperplasia Maternal Grandfather    Brain cancer Paternal Grandmother    Lung cancer Paternal Grandfather    Anesthesia problems Neg Hx    Hypotension Neg Hx    Malignant hyperthermia Neg Hx    Pseudochol deficiency Neg Hx     Social History Social History   Tobacco Use   Smoking status: Former Smoker    Types: Cigarettes    Quit date: 05/24/2011    Years since quitting: 7.4   Smokeless tobacco:  Never Used  Substance Use Topics   Alcohol use: Yes    Comment: Once a month on average   Drug use: No     Allergies   Patient has no known allergies.   Review of Systems Review of Systems  Constitutional: Negative for chills and fever.  Respiratory: Positive for cough. Negative for shortness of breath.   Cardiovascular: Negative for chest pain and palpitations.  Gastrointestinal: Negative for abdominal pain and vomiting.  Genitourinary: Negative for dysuria and hematuria.  Musculoskeletal: Negative for arthralgias and back pain.       LLE swelling and pain  Skin: Negative for color change and rash.  Neurological: Negative for seizures and syncope.  All other systems reviewed and are negative.    Physical Exam Updated Vital Signs BP (!) 157/103    Pulse 92    Temp 98.6 F (37 C) (Oral)    Resp 19    SpO2 99%   Physical Exam Vitals signs and nursing note reviewed.  Constitutional:      General: She is not in acute distress.    Appearance: She is well-developed. She is obese.  HENT:     Head: Normocephalic and atraumatic.  Eyes:     Conjunctiva/sclera: Conjunctivae normal.  Neck:     Musculoskeletal: Neck supple.  Cardiovascular:     Rate and Rhythm: Regular rhythm. Tachycardia present.  Pulmonary:     Effort: Pulmonary effort is normal. No respiratory distress.     Breath sounds: Normal breath sounds.  Abdominal:     Palpations: Abdomen is soft.     Tenderness: There is no abdominal tenderness.  Musculoskeletal:        General: Tenderness present.     Right lower leg: No edema.     Left lower leg: Edema present.     Comments: Left thigh tender to posterior palpation with mild swelling of the LLE noted compared to the right leg  Skin:    General: Skin is warm and dry.     Capillary Refill: Capillary refill takes less than 2 seconds.  Neurological:     General: No focal deficit present.     Mental Status: She is alert and oriented to person, place, and  time.      ED Treatments / Results  Labs (all labs ordered are listed, but only abnormal results are displayed) Labs Reviewed  CBC - Abnormal; Notable for the following components:      Result Value   RBC 5.28 (*)    All other components within normal limits  COMPREHENSIVE METABOLIC PANEL - Abnormal; Notable for the following components:   Glucose, Bld 308 (*)    Calcium 10.5 (*)    AST 73 (*)    ALT 73 (*)    All other components within normal limits  I-STAT BETA HCG BLOOD, ED ([facility], [facility], [facility] ONLY)    EKG EKG Interpretation  Date/Time:  Monday October 28 2018 17:40:28 EDT Ventricular Rate:  104 PR Interval:  178 QRS Duration: 72 QT Interval:  346 QTC Calculation: [id number] R Axis:   47 Text Interpretation:  Sinus tachycardia Nonspecific T wave abnormality Abnormal ECG When compared with ECG of 05/27/2018, No significant change was found Confirmed by Delora Fuel ([id number]) on 10/28/2018 11:17:25 PM   Radiology Ct Angio Chest Pe W And/or Wo Contrast  Result Date: 10/28/2018 CLINICAL DATA:  Shortness of breath EXAM: CT ANGIOGRAPHY CHEST WITH CONTRAST TECHNIQUE: Multidetector CT imaging of the chest was performed using the standard protocol during bolus administration of intravenous contrast. Multiplanar CT image reconstructions and MIPs were obtained to evaluate the vascular anatomy. CONTRAST:  130mL OMNIPAQUE IOHEXOL 350 MG/ML SOLN COMPARISON:  April 13, 2018 FINDINGS: Cardiovascular: There is a optimal opacification of the pulmonary arteries. There is no central,segmental, or subsegmental filling defects within the pulmonary arteries. The heart is normal in size. No pericardial effusion thickening. No evidence right heart strain. There is normal three-vessel brachiocephalic anatomy without proximal stenosis. The thoracic aorta is normal in appearance. Mediastinum/Nodes: No hilar, mediastinal, or axillary adenopathy. Thyroid gland, trachea, and esophagus demonstrate no significant findings.  Lungs/Pleura: The lungs are clear. No pleural effusion or pneumothorax. No airspace consolidation. Upper Abdomen: Diffuse low density seen throughout the liver parenchyma. Musculoskeletal: No chest wall abnormality. No acute or significant osseous findings. Review of the MIP images confirms the above findings. IMPRESSION: No central, segmental, or subsegmental pulmonary embolism. No acute intrathoracic pathology. Electronically Signed   By: Prudencio Pair M.D.   On: 10/28/2018 22:49   Vas [place] Lower Extremity Venous (dvt) (only [facility])  Result Date: 10/28/2018  Lower Venous Study Indications: Pain, and History of DVT, taken of Xarelto last month.  Limitations: Body habitus and patient's pain with compression. Comparison Study: Prior study done 04/13/18 is available for comparison Performing Technologist: Sharion Dove RVS  Examination Guidelines: A complete evaluation includes B-mode imaging, spectral Doppler, color Doppler, and power Doppler as needed of all accessible portions of each vessel. Bilateral testing is considered an integral part of a complete examination. Limited examinations for reoccurring indications may be performed as noted.  +-----+---------------+---------+-----------+----------+--------------+  RIGHT Compressibility Phasicity Spontaneity Properties Thrombus Aging  +-----+---------------+---------+-----------+----------+--------------+  CFV   Full            Yes       Yes                                    +-----+---------------+---------+-----------+----------+--------------+   +---------+---------------+---------+-----------+----------+--------------+  LEFT      Compressibility Phasicity Spontaneity Properties Thrombus Aging  +---------+---------------+---------+-----------+----------+--------------+  CFV       Full            Yes       Yes                                    +---------+---------------+---------+-----------+----------+--------------+  SFJ       Full                                                              +---------+---------------+---------+-----------+----------+--------------+  FV Prox   Full                                                             +---------+---------------+---------+-----------+----------+--------------+  FV Mid    Full                                                             +---------+---------------+---------+-----------+----------+--------------+  FV Distal None                                                             +---------+---------------+---------+-----------+----------+--------------+  PFV       Full                                                             +---------+---------------+---------+-----------+----------+--------------+  POP       Full            Yes       Yes                                    +---------+---------------+---------+-----------+----------+--------------+  PTV       Full                                                             +---------+---------------+---------+-----------+----------+--------------+  PERO      Full                                                             +---------+---------------+---------+-----------+----------+--------------+     Summary: Right: No evidence of common femoral vein obstruction. Left: Findings consistent with age indeterminate deep vein thrombosis involving the left femoral vein. Patient has age indeterminate DVT noted in distal femoral vein. In prior study done in March, 2020, patient had DVT in left Popliteal and posterior tibial veins, those have resolved.  *See table(s) above for measurements and observations.    Preliminary     Procedures Procedures (including critical care time)  Medications Ordered in ED Medications  iohexol (OMNIPAQUE) 350 MG/ML injection 100 mL (100 mLs Intravenous Contrast Given 10/28/18 2217)     Initial Impression / Assessment and Plan / ED Course  I have reviewed the triage vital signs and the nursing notes.  Pertinent labs &  imaging results that were available during my care of the patient were reviewed by me and considered in my medical decision making (see chart for details).  Clinical Course as of Oct 28 2342  Mon Oct 28, 2018  1954 Pulse Rate(!): 102 [JL]    Clinical Course User Index [JL] Regan Lemming, MD       The patient is a 33 year old female with a history of DVT and PE, previously on anticoagulation for 6 months and taken off Xarelto within the past month who presents with ten days of a cough and findings on Doppler US concerning for DVT of the left leg. She arrived to the ED tachycardic but not tachypneic and without an O2 requirement. PERC positive. Well's high risk. CTA PE study ordered to further evaluate for PE. The patient will need to restart her Xarelto and will likely need further workup outpatient for a hypercoagulable state.   CTA revealed no evidence of acute thromboembolic disease. No other acute pathology was identified. Results were communicated to the patient. I provided return precautions in the event of signs and symptoms concerning for a PE. Vitals stable prior to discharge and she remained without dyspnea or chest pain. The patient was provided a prescription for a Xarelto started pack and instructed to follow-up with her PCP.  Final Clinical Impressions(s) / ED Diagnoses   Final diagnoses:  Acute deep vein thrombosis (DVT) of femoral vein of left lower extremity [facility])    ED Discharge Orders         Ordered    Rivaroxaban 15 & 20 MG TBPK     10/28/18 2307           Regan Lemming, MD 10/28/18 2344    Noemi Chapel, MD 11/01/18 [phone]

## 2018-10-28 NOTE — ED Provider Notes (Signed)
I saw and evaluated the patient, reviewed the resident's note and I agree with the findings and plan.  Pertinent History: Obese 33 year old female who had her first DVT in March after a long trip in the car has stopped taking her medications within the last month or so and now presents with worsening pain in her leg.  She has a subtle discomfort in her chest which is poorly described and has been present for approximately 1 week but denies any shortness of breath at rest or with exertion  Pertinent Exam findings: Well-appearing, no distress, speaks in full sentences with normal lung sounds, borderline tachycardia, generally obese without any obvious asymmetry of the legs  I was personally present and directly supervised the following procedures:  Medical evaluation   The patient has been found to have a DVT, will rule out pulmonary embolism otherwise can restart Xarelto  I personally interpreted the EKG as well as the resident and agree with the interpretation on the resident's chart.  Final diagnoses:  Acute deep vein thrombosis (DVT) of femoral vein of left lower extremity (HCC)      Noemi Chapel, MD 11/01/18 [phone]

## 2018-10-28 NOTE — ED Notes (Signed)
Patient Alert and oriented to baseline. Stable and ambulatory to baseline. Patient verbalized understanding of the discharge instructions.  Patient belongings were taken by the patient.   

## 2018-10-28 NOTE — Discharge Instructions (Signed)
Your ultrasound showed that you have a DVT in your left femoral vein but your CT did not reveal any evidence of PE. We recommend you restart your Xarelto (will need to restart with a starter pack) and follow-up with your PCP.

## 2018-10-28 NOTE — Progress Notes (Signed)
VASCULAR LAB PRELIMINARY  PRELIMINARY  PRELIMINARY  PRELIMINARY  Left lower extremity venous duplex completed.    Preliminary report:  See CV proc for preliminary results  Gave Lenn Sink, PA-C report.  Korie Streat, RVT 10/28/2018, 6:56 PM

## 2018-10-30 ENCOUNTER — Other Ambulatory Visit: Payer: Self-pay | Admitting: Family Medicine

## 2018-11-11 ENCOUNTER — Ambulatory Visit (INDEPENDENT_AMBULATORY_CARE_PROVIDER_SITE_OTHER): Payer: 59 | Admitting: Family Medicine

## 2018-11-11 ENCOUNTER — Inpatient Hospital Stay: Payer: 59

## 2018-11-11 DIAGNOSIS — Z7901 Long term (current) use of anticoagulants: Secondary | ICD-10-CM

## 2018-11-11 DIAGNOSIS — Z09 Encounter for follow-up examination after completed treatment for conditions other than malignant neoplasm: Secondary | ICD-10-CM

## 2018-11-11 DIAGNOSIS — I82412 Acute embolism and thrombosis of left femoral vein: Secondary | ICD-10-CM | POA: Diagnosis not present

## 2018-11-11 DIAGNOSIS — E119 Type 2 diabetes mellitus without complications: Secondary | ICD-10-CM

## 2018-11-11 DIAGNOSIS — Z86711 Personal history of pulmonary embolism: Secondary | ICD-10-CM

## 2018-11-11 DIAGNOSIS — R748 Abnormal levels of other serum enzymes: Secondary | ICD-10-CM | POA: Diagnosis not present

## 2018-11-11 DIAGNOSIS — G479 Sleep disorder, unspecified: Secondary | ICD-10-CM

## 2018-11-11 DIAGNOSIS — Z86718 Personal history of other venous thrombosis and embolism: Secondary | ICD-10-CM

## 2018-11-11 NOTE — Progress Notes (Signed)
Virtual Visit via Telephone Note  I connected with Whitney [name]  on 11/11/18 at  2:30 PM EDT by telephone and verified that I am speaking with the correct person using two identifiers.   I discussed the limitations, risks, security and privacy concerns of performing an evaluation and management service by telephone and the availability of in person appointments. I also discussed with the patient that there may be a patient responsible charge related to this service. The patient expressed understanding and agreed to proceed.  Patient Location: Home Provider Location: [facility] Others participating in call: call initiated by Allyne Gee, CMA who then transferred the call to me   History of Present Illness:        33 year old female who is status post emergency department visit on 10/28/2018 due to 1 week of pain and swelling in her left lower leg after she had taken a long car trip.  Patient with history of lower extremity DVT with subsequent PE in March of this year and patient had been on Xarelto which was discontinued a little over a month ago.  In the emergency department patient was found to have age-indeterminate DVT of the left distal femoral vein.  CT angio did not show any evidence of pulmonary embolism.  Patient was placed back on Xarelto.  Patient reports that she was having a mild, dry cough since her emergency department visit but this has improved.  She denies any chest pain, shortness of breath or wheezing.  She continues to have some mild swelling and mild discomfort in the left lower extremity.  She denies any unusual bruising or bleeding related to her use of Xarelto.        She states that her main issue at this time is difficulty sleeping/getting restful sleep.  She reports that she has difficulty falling asleep and then has difficulty with staying asleep.  She was prescribed trazodone in the past which she states that this only helps her to get 3 to 4 hours of sleep.  She has  tried over-the-counter melatonin as well as over-the-counter Z quill and these do not work unless she takes both.  She reports that she lost her measuring so she has been guesstimating the amount of ZzzQuil that she was taking and she believes that it was likely less than the prescribed dose.  The combination of melatonin and ZzzQuil helped her with sleep however she reports that she actually overslept.  She does endorse daytime fatigue as well as possible snoring.  When she does sleep, she does not feel refreshed after sleeping.  She would actually like to be written out of work for a few days so that she can catch up on her sleep as she states that she only had a few hours of sleep last night.  She is currently working full-time as well as going to school therefore she only gets to bed at around 11 or 12 and usually later.  She also has a son and she tends to start her schoolwork after her son has been put to bed.  She is currently working from home.  On review of systems, she denies any current headache or dizziness, no chest pain or palpitations, no shortness of breath or cough.  Some continued left lower extremity edema and discomfort.  No abdominal pain-no nausea/vomiting/diarrhea or constipation and no blood in the stool.  No blood in the urine and no urinary frequency or dysuria.  She does have diabetes and she believes  that perhaps her blood sugars have been higher due to her lack of sleep.  Past Medical History:  Diagnosis Date  . Diabetes mellitus without complication ([place])   . DVT (deep venous thrombosis) ([place]) 03/2018  . Gallstones   . Headache(784.0)   . No pertinent past medical history     Past Surgical History:  Procedure Laterality Date  . CHOLECYSTECTOMY N/A 03/12/2012   Procedure: LAPAROSCOPIC CHOLECYSTECTOMY;  Surgeon: Harl Bowie, MD;  Location: [facility];  Service: General;  Laterality: N/A;    Family History  Problem Relation Age of Onset  . Healthy Mother   . Ulcerative  colitis Father   . Diabetes Maternal Grandmother   . Breast cancer Maternal Grandmother   . Syncope episode Maternal Grandfather   . Benign prostatic hyperplasia Maternal Grandfather   . Brain cancer Paternal Grandmother   . Lung cancer Paternal Grandfather   . Anesthesia problems Neg Hx   . Hypotension Neg Hx   . Malignant hyperthermia Neg Hx   . Pseudochol deficiency Neg Hx     Social History   Tobacco Use  . Smoking status: Former Smoker    Types: Cigarettes    Quit date: 05/24/2011    Years since quitting: 7.4  . Smokeless tobacco: Never Used  Substance Use Topics  . Alcohol use: Yes    Comment: Once a month on average  . Drug use: No     No Known Allergies     Observations/Objective: No vital signs or physical exam conducted as visit was done via telephone  Assessment and Plan: 1. Acute deep vein thrombosis (DVT) of femoral vein of left lower extremity (HCC); 2.  History of pulmonary embolism; encounter for examination following treatment at hospital;  Recurrent DVT; long-term use of anticoagulant Patient had lower extremity DVT in March of this year for which she was on Xarelto for approximately 6 months.  She however recently went on an extended car trip and had onset of left lower extremity pain and swelling and was seen at the emergency department on 10/28/2018 and diagnosed with new left lower extremity DVT.  Patient was restarted on Xarelto.  She reports that she still has some Xarelto from when she was last on the medication and does not need refill at this time.  She denies any unusual bruising or bleeding.  She denies any chest pain and no shortness of breath and has a resolving dry cough.  No new pulmonary embolism was seen on imaging done at the ED but patient did have pulmonary embolism with her DVT in March.  Patient's notes from her ED visit, labs and imaging were reviewed and discussed with the patient.  She has been referred to hematology to look for any disorders  which may be contributing to her recurrent DVTs.  She is encouraged to continue use of Xarelto.  She has been asked to come into the office in approximately 2 weeks for blood work including CBC in follow-up of long-term use of anticoagulant medication.  She reports no unusual bruising or bleeding and hemoglobin done on 10/28/2018 was normal at 14.9 and platelets normal at 295.  She will also have repeat complete metabolic panel as her blood sugar was elevated at 308 and AST and ALT elevated at 73. - CBC with Differential; Future -Ambulatory referral to hematology  3. Type 2 diabetes mellitus without complication, without long-term current use of insulin (HCC) Patient's most recent hemoglobin A1c was 7.7 on 09/09/2018.  She states that  she believes her blood sugars may not be as well-controlled due to her issues with sleep.  Patient has been asked to come into the office to have repeat BMP as her glucose at her recent emergency department visit was elevated at 300.  She will be due for repeat A1c in November.  She is encouraged to monitor her blood sugars, follow a low carbohydrate diet and be compliant with medications. - Comprehensive metabolic panel; Future  4. Elevated liver enzymes Patient's liver enzymes were elevated during recent emergency department visit on 10/28/2018 with ALT and AST both being elevated at 73 and she will have repeat LFTs as part of comprehensive metabolic panel at upcoming lab visit. - Comprehensive metabolic panel; Future  6. Sleep disorder She reports issues with difficulty falling asleep and maintaining sleep as well as daytime fatigue and nonrestful/nonrestorative sleep.  Patient also with morbid obesity.  Patient believes that she may also snore.  Patient reports that trazodone has not been very effective in helping with sleep.  Patient will be referred for split-night sleep study as I suspect that she may also have an element of sleep apnea which may be contributing to her  sleep difficulty. She requested work note for absence in order to sleep/rest for a few days and note written for patient pick-up for work absence from Nov 12, 2018 with return to work on Nov 14, 2018 - Split Night study; Future  Future Appointments  Date Time Provider [place]  12/09/2018  4:10 PM [facility]OVERING PROVIDER [facility] None    Follow Up Instructions:Return in about 4 weeks (around 12/09/2018) for ED visit/sleep/diabetes: 2-week lab visit and 4 weeks in office.    I discussed the assessment and treatment plan with the patient. The patient was provided an opportunity to ask questions and all were answered. The patient agreed with the plan and demonstrated an understanding of the instructions.   The patient was advised to call back or seek an in-person evaluation if the symptoms worsen or if the condition fails to improve as anticipated.  I provided 16 minutes of non-face-to-face time during this encounter.   Antony Blackbird, MD

## 2018-11-11 NOTE — Progress Notes (Signed)
Following up from ED f/up for DVT. Still having some SHOB. No side effects from Xarelto.  States that she is still having issues with her insomnia. Even with the Trazodone she's only getting about 3-4 hrs/night.

## 2018-11-13 ENCOUNTER — Telehealth: Payer: Self-pay | Admitting: Hematology and Oncology

## 2018-11-13 NOTE — Telephone Encounter (Signed)
Received a new hem referral from Dr. Chapman Fitch for hx of dvt. Pt returned my call and has been scheduled to see Dr. Lorenso Courier on 10/26 at 2pm. Pt aware to arrive 15 minutes early.

## 2018-11-14 ENCOUNTER — Telehealth: Payer: Self-pay | Admitting: Hematology and Oncology

## 2018-11-14 NOTE — Telephone Encounter (Signed)
Whitney [name] has been cld and rescheduled to see Dr. Lorenso Courier on 10/27 at 2pm.

## 2018-11-15 ENCOUNTER — Telehealth: Payer: Self-pay | Admitting: Family Medicine

## 2018-11-15 NOTE — Telephone Encounter (Signed)
Dr Chapman Fitch put the order is not referral  on 10/19  . They will contact the patient to schedule an appointment. Thank you

## 2018-11-15 NOTE — Telephone Encounter (Signed)
Patient calling in regards to referral for a sleep study.  Please contact patient.

## 2018-11-18 ENCOUNTER — Encounter: Payer: 59 | Admitting: Hematology and Oncology

## 2018-11-18 ENCOUNTER — Other Ambulatory Visit: Payer: Self-pay | Admitting: Hematology and Oncology

## 2018-11-18 DIAGNOSIS — I82412 Acute embolism and thrombosis of left femoral vein: Secondary | ICD-10-CM

## 2018-11-19 ENCOUNTER — Inpatient Hospital Stay: Payer: 59

## 2018-11-19 ENCOUNTER — Encounter: Payer: Self-pay | Admitting: Hematology and Oncology

## 2018-11-19 ENCOUNTER — Inpatient Hospital Stay: Payer: 59 | Attending: Hematology and Oncology | Admitting: Hematology and Oncology

## 2018-11-19 ENCOUNTER — Other Ambulatory Visit: Payer: Self-pay

## 2018-11-19 VITALS — BP 181/122 | HR 86 | Temp 97.8°F | Resp 17 | Ht 71.0 in | Wt 364.1 lb

## 2018-11-19 DIAGNOSIS — I2699 Other pulmonary embolism without acute cor pulmonale: Secondary | ICD-10-CM | POA: Diagnosis not present

## 2018-11-19 DIAGNOSIS — I82412 Acute embolism and thrombosis of left femoral vein: Secondary | ICD-10-CM

## 2018-11-19 DIAGNOSIS — Z23 Encounter for immunization: Secondary | ICD-10-CM | POA: Diagnosis not present

## 2018-11-19 DIAGNOSIS — I82402 Acute embolism and thrombosis of unspecified deep veins of left lower extremity: Secondary | ICD-10-CM

## 2018-11-19 DIAGNOSIS — Z7901 Long term (current) use of anticoagulants: Secondary | ICD-10-CM

## 2018-11-19 DIAGNOSIS — Z801 Family history of malignant neoplasm of trachea, bronchus and lung: Secondary | ICD-10-CM

## 2018-11-19 DIAGNOSIS — Z803 Family history of malignant neoplasm of breast: Secondary | ICD-10-CM

## 2018-11-19 DIAGNOSIS — Z87891 Personal history of nicotine dependence: Secondary | ICD-10-CM

## 2018-11-19 DIAGNOSIS — Z808 Family history of malignant neoplasm of other organs or systems: Secondary | ICD-10-CM

## 2018-11-19 LAB — CBC WITH DIFFERENTIAL (CANCER CENTER ONLY)
Abs Immature Granulocytes: 0.03 10*3/uL (ref 0.00–0.07)
Basophils Absolute: 0 10*3/uL (ref 0.0–0.1)
Basophils Relative: 1 %
Eosinophils Absolute: 0.1 10*3/uL (ref 0.0–0.5)
Eosinophils Relative: 2 %
HCT: 42.4 % (ref 36.0–46.0)
Hemoglobin: 14.3 g/dL (ref 12.0–15.0)
Immature Granulocytes: 0 %
Lymphocytes Relative: 24 %
Lymphs Abs: 1.7 10*3/uL (ref 0.7–4.0)
MCH: 27.4 pg (ref 26.0–34.0)
MCHC: 33.7 g/dL (ref 30.0–36.0)
MCV: 81.2 fL (ref 80.0–100.0)
Monocytes Absolute: 0.4 10*3/uL (ref 0.1–1.0)
Monocytes Relative: 6 %
Neutro Abs: 4.8 10*3/uL (ref 1.7–7.7)
Neutrophils Relative %: 67 %
Platelet Count: 254 10*3/uL (ref 150–400)
RBC: 5.22 MIL/uL — ABNORMAL HIGH (ref 3.87–5.11)
RDW: 12.1 % (ref 11.5–15.5)
WBC Count: 7.1 10*3/uL (ref 4.0–10.5)
nRBC: 0 % (ref 0.0–0.2)

## 2018-11-19 LAB — CMP (CANCER CENTER ONLY)
ALT: 73 U/L — ABNORMAL HIGH (ref 0–44)
AST: 71 U/L — ABNORMAL HIGH (ref 15–41)
Albumin: 3.9 g/dL (ref 3.5–5.0)
Alkaline Phosphatase: 71 U/L (ref 38–126)
Anion gap: 12 (ref 5–15)
BUN: 7 mg/dL (ref 6–20)
CO2: 23 mmol/L (ref 22–32)
Calcium: 10.3 mg/dL (ref 8.9–10.3)
Chloride: 102 mmol/L (ref 98–111)
Creatinine: 1.01 mg/dL — ABNORMAL HIGH (ref 0.44–1.00)
GFR, Est AFR Am: >60 mL/min (ref >60)
GFR, Estimated: >60 mL/min (ref >60)
Glucose, Bld: 324 mg/dL — ABNORMAL HIGH (ref 70–99)
Potassium: 3.6 mmol/L (ref 3.5–5.1)
Sodium: 137 mmol/L (ref 135–145)
Total Bilirubin: 0.3 mg/dL (ref 0.3–1.2)
Total Protein: 7.6 g/dL (ref 6.5–8.1)

## 2018-11-19 LAB — HCG, SERUM, QUALITATIVE: Preg, Serum: NEGATIVE

## 2018-11-19 MED ORDER — INFLUENZA VAC SPLIT QUAD 0.5 ML IM SUSY
PREFILLED_SYRINGE | INTRAMUSCULAR | Status: AC
  Filled 2018-11-19: qty 0.5

## 2018-11-19 MED ORDER — INFLUENZA VAC SPLIT QUAD 0.5 ML IM SUSY
0.5000 mL | PREFILLED_SYRINGE | Freq: Once | INTRAMUSCULAR | Status: AC
  Administered 2018-11-19: 0.5 mL via INTRAMUSCULAR

## 2018-11-19 NOTE — Progress Notes (Signed)
[facility] [phone]   Fax:([phone]) [place] NOTE  Patient Care Team: Scot Jun, FNP as PCP - General (Family Medicine)  Hematological/Oncological History #Pulmonary Embolism/Lower Extremity DVT 1) 04/13/2018: presented to the ED with shortness of breath, found to have moderate right sided pulmonary emboli with small peripheral left pulmonary emboli. LE doppler showed age indeterminate deep vein thrombosis involving the left popliteal vein, and left posterior tibial vein. Started on xarelto therapy 2) 10/28/2018: presented to the ED with left lower extremity distal femoral vein DVT. Her CTA was negative for PE at that time  3) 11/19/2018: establish care with Dr. Lorenso Courier   CHIEF COMPLAINTS/PURPOSE OF CONSULTATION:  DVT of the Lower Extremity, history of Pulmonary Embolism  HISTORY OF PRESENTING ILLNESS:  IMO ALKHATIB 33 y.o. female with medical history significant for DM type II and gallstones who presents for evaluation of a DVT of the lower extremity.   On review of the records, Ms. Kalmbach presented to the [facility] ED on 04/13/2018 with left leg pain and dyspnea on exertion. She did not require any supplemental O2 at that time. She underwent LLE [place] which revealed an age indeterminate deep vein thrombosis involving the left popliteal vein, and left posterior tibial vein. A CTA showed moderate right sided pulmonary emboli with small peripheral left pulmonary emboli. She was started on Xarelto on 04/14/18 and recommended to continue x 3 months, as it was presumed that this was a provoked PE from a prolonged car ride. On 10/28/2018 Ms. Dunklin presented to the ED again with new onset leg pain and was found to have a DVT in a different vein in the same leg (left lower extremity distal femoral vein DVT). She was referred to hematology for further evaluation and management.   On exam today Ms. Laramie notes that the pain in her leg has resolved with  restarting the Xarelto. She does not have any shortness of breath or chest pain today. She is now on day 15 of 15mg  BID xarelto and has the pills to convert to 20mg  daily on day 22. She further explained that prior to the symptoms of her first clot she drove back and forth to [place] in 1 day, with a total car ride length of 12 hours. She had minimal breaks/stretches during this time. She had tingling in the leg that then turned to pain. It was approximately 3 weeks between onset of symptoms and her initial 04/13/18 ED visit. During her most recent blood clot she had been working from home, mostly working from a chair with limited opportunities to exercise and move her legs. She has had no recent illness or immobility. When the pain developed this time she recognized it almost immediately and was in the ED the next day (10/28/18).  On discussion of her family history, Ms. Mcalister notes her maternal grandmother and maternal aunt both had issues with blood clots (legs for sure, unsure if PE). Her mother has no blood disorders. Her PGF died of lung cancer, PGM died of brain cancer, and MGM had breast cancer. Her father has ulcerative colitis. She has a 62 year old son who is healthy.   She reports she tolerated Xarelto well, with no overt bleeding or bruising. She was compliant with the therapy and was able to afford the medication with the help of her family. She is also in the process of being worked up for insomnia by her PCP.   A further full 10 point  ROS is covered below.   MEDICAL HISTORY:  Past Medical History:  Diagnosis Date   Diabetes mellitus without complication ([place])    DVT (deep venous thrombosis) ([place]) 03/2018   Gallstones    Headache(784.0)    No pertinent past medical history     SURGICAL HISTORY: Past Surgical History:  Procedure Laterality Date   CHOLECYSTECTOMY N/A 03/12/2012   Procedure: LAPAROSCOPIC CHOLECYSTECTOMY;  Surgeon: Harl Bowie, MD;  Location: [facility];   Service: General;  Laterality: N/A;    SOCIAL HISTORY: Social History   Socioeconomic History   Marital status: Single    Spouse name: Not on file   Number of children: 1   Years of education: 13   Highest education level: Not on file  Occupational History   Occupation: Geologist, engineering strain: Not on file   Food insecurity    Worry: Not on file    Inability: Not on file   Transportation needs    Medical: Not on file    Non-medical: Not on file  Tobacco Use   Smoking status: Former Smoker    Types: Cigarettes    Quit date: 05/24/2011    Years since quitting: 7.4   Smokeless tobacco: Never Used  Substance and Sexual Activity   Alcohol use: Yes    Comment: Once a month on average   Drug use: No   Sexual activity: Yes    Birth control/protection: None  Lifestyle   Physical activity    Days per week: Not on file    Minutes per session: Not on file   Stress: Not on file  Relationships   Social connections    Talks on phone: Not on file    Gets together: Not on file    Attends religious service: Not on file    Active member of club or organization: Not on file    Attends meetings of clubs or organizations: Not on file    Relationship status: Not on file   Intimate partner violence    Fear of current or ex partner: Not on file    Emotionally abused: Not on file    Physically abused: Not on file    Forced sexual activity: Not on file  Other Topics Concern   Not on file  Social History Narrative   Fun: Read, hang out with her son.    Denies abuse and feels safe at home.     FAMILY HISTORY: Family History  Problem Relation Age of Onset   Healthy Mother    Ulcerative colitis Father    Diabetes Maternal Grandmother    Breast cancer Maternal Grandmother    Syncope episode Maternal Grandfather    Benign prostatic hyperplasia Maternal Grandfather    Brain cancer Paternal Grandmother    Lung cancer Paternal  Grandfather    Anesthesia problems Neg Hx    Hypotension Neg Hx    Malignant hyperthermia Neg Hx    Pseudochol deficiency Neg Hx     ALLERGIES:  has No Known Allergies.  MEDICATIONS:  Current Outpatient Medications  Medication Sig Dispense Refill   amLODipine (NORVASC) 5 MG tablet Take 1 tablet (5 mg total) by mouth daily. To lower blood sugar (Patient taking differently: Take 5 mg by mouth daily with supper. To lower blood pressure) 90 tablet 3   Biotin 1 MG CAPS Take 1 mg by mouth daily with breakfast.      hydrochlorothiazide (HYDRODIURIL) 25 MG tablet Take 1  tablet (25 mg total) by mouth daily. (Patient taking differently: Take 25 mg by mouth daily with supper. ) 90 tablet 3   losartan (COZAAR) 50 MG tablet Take 2 tablets (100 mg total) by mouth daily. (Patient taking differently: Take 100 mg by mouth daily with supper. ) 60 tablet 3   metFORMIN (GLUCOPHAGE) 1000 MG tablet Take 1 tablet (1,000 mg total) by mouth 2 (two) times daily with a meal. (Patient taking differently: Take 200 mg by mouth daily with supper. ) 60 tablet 3   Multiple Vitamins-Minerals (HAIR/SKIN/NAILS) TABS Take 1 tablet by mouth daily with breakfast.     Rivaroxaban 15 & 20 MG TBPK Follow package directions: Take one 15mg  tablet by mouth twice a day. On day 22, switch to one 20mg  tablet once a day. Take with food. 51 each 0   traZODone (DESYREL) 50 MG tablet Take 0.5-1 tablets (25-50 mg total) by mouth at bedtime as needed for sleep. (Patient not taking: Reported on 11/19/2018) 30 tablet 3   No current facility-administered medications for this visit.     REVIEW OF SYSTEMS:   Constitutional: ( - ) fevers, ( - )  chills , ( - ) night sweats (+) insomnia  Eyes: ( - ) blurriness of vision, ( - ) double vision, ( - ) watery eyes Ears, nose, mouth, throat, and face: ( - ) mucositis, ( - ) sore throat Respiratory: ( - ) cough, ( - ) dyspnea, ( - ) wheezes Cardiovascular: ( - ) palpitation, ( - ) chest  discomfort, ( - ) lower extremity swelling Gastrointestinal:  ( - ) nausea, ( - ) heartburn, ( - ) change in bowel habits Skin: ( - ) abnormal skin rashes Lymphatics: ( - ) new lymphadenopathy, ( - ) easy bruising Neurological: ( - ) numbness, ( - ) tingling, ( - ) new weaknesses Behavioral/Psych: ( - ) mood change, ( - ) new changes  All other systems were reviewed with the patient and are negative.  PHYSICAL EXAMINATION: ECOG PERFORMANCE STATUS: 1 - Symptomatic but completely ambulatory  Vitals:   11/19/18 1426  BP: (!) 181/122  Pulse: 86  Resp: 17  Temp: 97.8 F (36.6 C)  SpO2: 99%   Filed Weights   11/19/18 1426  Weight: (!) 364 lb 1.6 oz (165.2 kg)    GENERAL: well appearing obese African American female in NAD  SKIN: skin color, texture, turgor are normal, no rashes or significant lesions EYES: conjunctiva are pink and non-injected, sclera clear LYMPH:  no palpable lymphadenopathy in the cervical, axillary or inguinal LUNGS: clear to auscultation and percussion with normal breathing effort HEART: regular rate & rhythm and no murmurs and no lower extremity edema ABDOMEN: soft, non-tender, non-distended, normal bowel sounds Musculoskeletal: no cyanosis of digits and no clubbing  PSYCH: alert & oriented x 3, fluent speech NEURO: no focal motor/sensory deficits  LABORATORY DATA:  I have reviewed the data as listed  Recent Results (from the past 2160 hour(s))  Hemoglobin A1c     Status: Abnormal   Collection Time: 09/09/18 10:55 AM  Result Value Ref Range   Hgb A1c MFr Bld 7.7 (H) 4.8 - 5.6 %    Comment:          Prediabetes: 5.7 - 6.4          Diabetes: >6.4          Glycemic control for adults with diabetes: <7.0    Est. average glucose Bld gHb Est-mCnc 174  mg/dL  Basic Metabolic Panel     Status: Abnormal   Collection Time: 09/09/18 10:55 AM  Result Value Ref Range   Glucose 178 (H) 65 - 99 mg/dL   BUN 9 6 - 20 mg/dL   Creatinine, Ser 0.85 0.57 - 1.00 mg/dL     GFR calc non Af Amer 90 >59 mL/min/1.73   GFR calc Af Amer 104 >59 mL/min/1.73   BUN/Creatinine Ratio 11 9 - 23   Sodium 139 134 - 144 mmol/L   Potassium 3.9 3.5 - 5.2 mmol/L   Chloride 104 96 - 106 mmol/L   CO2 22 20 - 29 mmol/L   Calcium 10.9 (H) 8.7 - 10.2 mg/dL  CBC with Differential     Status: None   Collection Time: 09/09/18 10:55 AM  Result Value Ref Range   WBC 7.9 3.4 - 10.8 x10E3/uL   RBC 5.01 3.77 - 5.28 x10E6/uL   Hemoglobin 13.9 11.1 - 15.9 g/dL   Hematocrit 42.1 34.0 - 46.6 %   MCV 84 79 - 97 fL   MCH 27.7 26.6 - 33.0 pg   MCHC 33.0 31.5 - 35.7 g/dL   RDW 12.8 11.7 - 15.4 %   Platelets 287 150 - 450 x10E3/uL   Neutrophils 65 Not Estab. %   Lymphs 25 Not Estab. %   Monocytes 7 Not Estab. %   Eos 2 Not Estab. %   Basos 1 Not Estab. %   Neutrophils Absolute 5.1 1.4 - 7.0 x10E3/uL   Lymphocytes Absolute 2.0 0.7 - 3.1 x10E3/uL   Monocytes Absolute 0.6 0.1 - 0.9 x10E3/uL   EOS (ABSOLUTE) 0.1 0.0 - 0.4 x10E3/uL   Basophils Absolute 0.1 0.0 - 0.2 x10E3/uL   Immature Granulocytes 0 Not Estab. %   Immature Grans (Abs) 0.0 0.0 - 0.1 x10E3/uL  CBC     Status: Abnormal   Collection Time: 10/28/18  5:26 PM  Result Value Ref Range   WBC 7.1 4.0 - 10.5 K/uL   RBC 5.28 (H) 3.87 - 5.11 MIL/uL   Hemoglobin 14.9 12.0 - 15.0 g/dL   HCT 43.0 36.0 - 46.0 %   MCV 81.4 80.0 - 100.0 fL   MCH 28.2 26.0 - 34.0 pg   MCHC 34.7 30.0 - 36.0 g/dL   RDW 11.9 11.5 - 15.5 %   Platelets 295 150 - 400 K/uL   nRBC 0.0 0.0 - 0.2 %    Comment: Performed at [facility], [place] [address] [place], [place] [address]  Comprehensive metabolic panel     Status: Abnormal   Collection Time: 10/28/18  5:26 PM  Result Value Ref Range   Sodium 137 135 - 145 mmol/L   Potassium 3.9 3.5 - 5.1 mmol/L   Chloride 101 98 - 111 mmol/L   CO2 23 22 - 32 mmol/L   Glucose, Bld 308 (H) 70 - 99 mg/dL   BUN 8 6 - 20 mg/dL   Creatinine, Ser 0.88 0.44 - 1.00 mg/dL   Calcium 10.5 (H) 8.9 - 10.3 mg/dL    Total Protein 7.4 6.5 - 8.1 g/dL   Albumin 3.9 3.5 - 5.0 g/dL   AST 73 (H) 15 - 41 U/L   ALT 73 (H) 0 - 44 U/L   Alkaline Phosphatase 68 38 - 126 U/L   Total Bilirubin 0.5 0.3 - 1.2 mg/dL   GFR calc non Af Amer >60 >60 mL/min   GFR calc Af Amer >60 >60 mL/min   Anion gap 13 5 -  15    Comment: Performed at [facility], [place] [address], [place], [place] [address]  I-Stat beta hCG blood, ED     Status: None   Collection Time: 10/28/18  9:05 PM  Result Value Ref Range   I-stat hCG, quantitative <5.0 <5 mIU/mL   Comment 3            Comment:   GEST. AGE      CONC.  (mIU/mL)   <=1 WEEK        5 - 50     2 WEEKS       50 - 500     3 WEEKS       100 - 10,000     4 WEEKS     1,000 - 30,000        FEMALE AND NON-PREGNANT FEMALE:     LESS THAN 5 mIU/mL     RADIOGRAPHIC STUDIES: I have personally reviewed the radiological images as listed and agreed with the findings in the report: no PE notable or other pathologies in the CT scan of the chest.  Ct Angio Chest Pe W And/or Wo Contrast  Result Date: 10/28/2018 CLINICAL DATA:  Shortness of breath EXAM: CT ANGIOGRAPHY CHEST WITH CONTRAST TECHNIQUE: Multidetector CT imaging of the chest was performed using the standard protocol during bolus administration of intravenous contrast. Multiplanar CT image reconstructions and MIPs were obtained to evaluate the vascular anatomy. CONTRAST:  177mL OMNIPAQUE IOHEXOL 350 MG/ML SOLN COMPARISON:  April 13, 2018 FINDINGS: Cardiovascular: There is a optimal opacification of the pulmonary arteries. There is no central,segmental, or subsegmental filling defects within the pulmonary arteries. The heart is normal in size. No pericardial effusion thickening. No evidence right heart strain. There is normal three-vessel brachiocephalic anatomy without proximal stenosis. The thoracic aorta is normal in appearance. Mediastinum/Nodes: No hilar, mediastinal, or axillary adenopathy. Thyroid gland, trachea, and esophagus  demonstrate no significant findings. Lungs/Pleura: The lungs are clear. No pleural effusion or pneumothorax. No airspace consolidation. Upper Abdomen: Diffuse low density seen throughout the liver parenchyma. Musculoskeletal: No chest wall abnormality. No acute or significant osseous findings. Review of the MIP images confirms the above findings. IMPRESSION: No central, segmental, or subsegmental pulmonary embolism. No acute intrathoracic pathology. Electronically Signed   By: Prudencio Pair M.D.   On: 10/28/2018 22:49   Vas [place] Lower Extremity Venous (dvt) (only [facility])  Result Date: 11/05/2018  Lower Venous Study Indications: Pain, and Edema. Other Indications: History of DVT, taken off Xarelto last month. Limitations: Body habitus. Comparison Study: Prior study done 04/13/18 is available for comparison Performing Technologist: Sharion Dove RVS  Examination Guidelines: A complete evaluation includes B-mode imaging, spectral Doppler, color Doppler, and power Doppler as needed of all accessible portions of each vessel. Bilateral testing is considered an integral part of a complete examination. Limited examinations for reoccurring indications may be performed as noted.  +-----+---------------+---------+-----------+----------+--------------+  RIGHT Compressibility Phasicity Spontaneity Properties Thrombus Aging  +-----+---------------+---------+-----------+----------+--------------+  CFV   Partial         Yes       Yes                                    +-----+---------------+---------+-----------+----------+--------------+   +---------+---------------+---------+-----------+----------+-----------------+  LEFT      Compressibility Phasicity Spontaneity Properties Thrombus Aging     +---------+---------------+---------+-----------+----------+-----------------+  CFV       Full  Yes       Yes                                       +---------+---------------+---------+-----------+----------+-----------------+   SFJ       Full                                                                +---------+---------------+---------+-----------+----------+-----------------+  FV Prox   Full                                                                +---------+---------------+---------+-----------+----------+-----------------+  FV Mid    Full                                                                +---------+---------------+---------+-----------+----------+-----------------+  FV Distal None                                                                +---------+---------------+---------+-----------+----------+-----------------+  PFV       Full                                             Age Indeterminate  +---------+---------------+---------+-----------+----------+-----------------+  POP       Full            Yes       Yes                                       +---------+---------------+---------+-----------+----------+-----------------+  PTV       Full                                                                +---------+---------------+---------+-----------+----------+-----------------+     Summary: Right: No evidence of common femoral vein obstruction. Left: Findings consistent with age indeterminate deep vein thrombosis involving the left distal femoral vein.  *See table(s) above for measurements and observations. Electronically signed by Deitra Mayo MD on 11/05/2018 at 3:34:03 PM.    Final     ASSESSMENT & PLAN MADDYX BEAMES 33 y.o. female with medical history significant for DM type II and  gallstones who presents for evaluation of a DVT of the lower extremity. She had discontinued DOAC therapy x 3-4 weeks prior to the onset of her latest clot. It is also worth noting that the clot in the left leg is in a different vein than prior, making it clear this was a new VTE. She received appropriate therapy of xarelto x 6 months. With this new VTE there is valid concern for a hypercoagulable state.   Use  of Xarelto in this patient is appropriate for now, however I would recommend we effectively r/o APS as DOAC therapy has been shown to be inferior to coumadin in that setting. (Annals of Internal Medicine 19 November 2019Volume 171, Issue 10). As such we will order APS studies. Additionally DOAC therapy has not been appropriately studied in obese populations, therefore data is markedly limited. Recent reviews have noted that therapy is acceptable as long as there is shared decision making with the patient, which has been explained to Ms. Ingles (Blood (2020) 135 (12): [id number]).  Assessment for other inheritable mutations or hypercoagulable states is not indicated at this time as it would not alter management. Given these two clots in the last year it is apparent Ms. Lim is in a hypercoagulable state and would benefit from indefinite anticoagulation therapy.   #Recurrent VTEs #Left Lower Extremity DVT and Pulmonary Embolism --in the setting of 2 DVTs, with the latest having no clear inciting factor, I would recommend indefinite continuation of her anticoagulation therapy --Xarelto is a reasonable choice for her anticoagulation. She has a BMI of 50, which is well out of the original study range. More recent literature has been accepting of the use of DOACs in this population as long as risks/benefits are discussed, which we have done today --will assess for antiphospholipid antibody syndrome, as this could potentially make [place] change therapy from xarelto to coumadin  --strict return precautions for shortness of breath, LE swelling or pain, or bleeding --assess baseline CBC and CMP prior to continuation of DOAC therapy. Also, will order pregnancy test as DOACs are not recommended for pregnant patients --RTC in 3 months time or sooner if required by bloodwork today.   Orders Placed This Encounter  Procedures   Beta-2-glycoprotein i abs, IgG/M/A    Standing Status:   Future    Number of Occurrences:   1     Standing Expiration Date:   11/19/2019   Cardiolipin antibodies, IgG, IgM, IgA*    Standing Status:   Future    Number of Occurrences:   1    Standing Expiration Date:   11/19/2019   Lupus anticoagulant panel*    Standing Status:   Future    Number of Occurrences:   1    Standing Expiration Date:   11/19/2019    All questions were answered. The patient knows to call the clinic with any problems, questions or concerns.  A total of more than 60 minutes were spent face-to-face with the patient during this encounter and over half of that time was spent on counseling and coordination of care as outlined above.   Ledell Peoples, MD Department of Hematology/Oncology [place] [facility] Phone: [phone] Pager: [phone] Email: [email or web address]   11/19/2018 3:27 PM   Literature Support:  Rivaroxaban Versus Vitamin K Antagonist in Antiphospholipid Syndrome A Randomized Noninferiority Trial Judithann Sheen, MD, PhD, Sonny Dandy, MD, PhD, Maudie Mercury, MD, et al. Annals of Internal Medicine 19 November 2019Volume 171, Issue 10  --Rivaroxaban  did not show noninferiority to dose-adjusted VKAs for thrombotic APS and, in fact, showed a non-statistically significant near doubling of the risk for recurrent thrombosis.   How I treat obese patients with oral anticoagulants. Tzu-Fei Mertha Finders Carrier Blood [phone]) 135 (12): [id number].  --Standard treatment doses of DOACs can be used in patients with VTE or AF and who have a body weight greater than 120 kg or BMI greater than 40 kg/m2 as long as there is shared decision-making after an informed discussion of available evidence

## 2018-11-19 NOTE — Progress Notes (Signed)
Staff message sent to Stefanie Libel, financial advisor as pt needs assistance with cost of Xarelto.

## 2018-11-20 ENCOUNTER — Telehealth: Payer: Self-pay | Admitting: Hematology and Oncology

## 2018-11-20 ENCOUNTER — Telehealth: Payer: Self-pay | Admitting: *Deleted

## 2018-11-20 NOTE — Telephone Encounter (Signed)
Xarelto patient assistance forms completed by Dr. Lorenso Courier and mailed to patient for her to complete and mail to [facility] and [employer]

## 2018-11-20 NOTE — Telephone Encounter (Signed)
Will schedule 15mo appointment once template is in place

## 2018-11-21 ENCOUNTER — Other Ambulatory Visit: Payer: 59

## 2018-11-21 LAB — BETA-2-GLYCOPROTEIN I ABS, IGG/M/A
Beta-2 Glyco I IgG: <9 GPI IgG units (ref 0–20)
Beta-2-Glycoprotein I IgA: <9 GPI IgA units (ref 0–25)
Beta-2-Glycoprotein I IgM: <9 GPI IgM units (ref 0–32)

## 2018-11-21 LAB — CARDIOLIPIN ANTIBODIES, IGG, IGM, IGA
Anticardiolipin IgA: <9 APL U/mL (ref 0–11)
Anticardiolipin IgG: <9 GPL U/mL (ref 0–14)
Anticardiolipin IgM: <9 MPL U/mL (ref 0–12)

## 2018-11-21 LAB — LUPUS ANTICOAGULANT PANEL
DRVVT: 37.1 s (ref 0.0–47.0)
PTT Lupus Anticoagulant: 26.2 s (ref 0.0–51.9)

## 2018-11-22 ENCOUNTER — Telehealth: Payer: Self-pay | Admitting: *Deleted

## 2018-11-22 NOTE — Telephone Encounter (Signed)
TCT patient regarding lab results from this week. Spoke with patient. Advised that all her lab work was normal. Dr. Lorenso Courier wants to continue her on her Xarelto as ordered.  Also advised that I have sent forms to her for patient assistance on the cost of her Xarelto. Advised that she fills out thne highlighted areas and then mails this to the [employer]. Whitney [name] voiced understanding.

## 2018-11-25 ENCOUNTER — Other Ambulatory Visit: Payer: Self-pay

## 2018-11-25 ENCOUNTER — Other Ambulatory Visit: Payer: 59

## 2018-12-03 ENCOUNTER — Other Ambulatory Visit: Payer: Self-pay

## 2018-12-03 DIAGNOSIS — Z20822 Contact with and (suspected) exposure to covid-19: Secondary | ICD-10-CM

## 2018-12-06 ENCOUNTER — Telehealth: Payer: Self-pay

## 2018-12-06 LAB — NOVEL CORONAVIRUS, NAA: SARS-CoV-2, NAA: NOT DETECTED

## 2018-12-06 NOTE — Telephone Encounter (Signed)
Called patient to do their pre-visit COVID screening.  Have you tested positive for COVID or are you currently waiting for COVID test results? No. Was tested but result was negative  Have you recently traveled internationally([place], [place], [place], [place], [place]) or within the [place] to a hotspot area([place], [place], [place], [place], [place])? no  Are you currently experiencing any of the following symptoms: fever, cough, SHOB, fatigue, body aches, loss of smell/taste, rash, diarrhea, vomiting, severe headaches, weakness, sore throat? no  Have you been in contact with anyone who has recently travelled? no  Have you been in contact with anyone who is experiencing any of the above symptoms or been diagnosed with COVID  or works in or has recently visited a SNF? no

## 2018-12-09 ENCOUNTER — Ambulatory Visit: Payer: 59

## 2018-12-09 ENCOUNTER — Ambulatory Visit (INDEPENDENT_AMBULATORY_CARE_PROVIDER_SITE_OTHER): Payer: 59 | Admitting: Family Medicine

## 2018-12-09 DIAGNOSIS — Z86718 Personal history of other venous thrombosis and embolism: Secondary | ICD-10-CM

## 2018-12-09 DIAGNOSIS — Z7901 Long term (current) use of anticoagulants: Secondary | ICD-10-CM

## 2018-12-09 DIAGNOSIS — Z86711 Personal history of pulmonary embolism: Secondary | ICD-10-CM

## 2018-12-09 DIAGNOSIS — G479 Sleep disorder, unspecified: Secondary | ICD-10-CM

## 2018-12-09 DIAGNOSIS — F5101 Primary insomnia: Secondary | ICD-10-CM

## 2018-12-09 DIAGNOSIS — I10 Essential (primary) hypertension: Secondary | ICD-10-CM | POA: Diagnosis not present

## 2018-12-09 DIAGNOSIS — I82402 Acute embolism and thrombosis of unspecified deep veins of left lower extremity: Secondary | ICD-10-CM

## 2018-12-09 DIAGNOSIS — E119 Type 2 diabetes mellitus without complications: Secondary | ICD-10-CM | POA: Diagnosis not present

## 2018-12-09 NOTE — Progress Notes (Signed)
Virtual Visit via Telephone Note  I connected with Whitney [name] on 12/09/18 at  4:10 PM EST by telephone and verified that I am speaking with the correct person using two identifiers.   I discussed the limitations, risks, security and privacy concerns of performing an evaluation and management service by telephone and the availability of in person appointments. I also discussed with the patient that there may be a patient responsible charge related to this service. The patient expressed understanding and agreed to proceed.  Patient Location: Home Provider Location: [facility] Others participating in call: none   History of Present Illness:      33 year old female seen in follow-up of chronic medical issues including type 2 diabetes, hypertension and patient with recurrent left lower extremity DVT and history of pulmonary embolism.  She reports that she did recently follow-up with hematology and was told that she needs to continue to take Xarelto daily.  She denies any unusual bruising or bleeding.  She denies any headaches or dizziness related to her blood pressure but she did have elevated blood pressure when she saw the hematologist.  She thinks that she was nervous about the visit which contributed to her elevated blood pressure.  She continues to take her Metformin for treatment of diabetes.  She does not check her blood sugars on a regular basis.  She denies any increased thirst, no urinary frequency and no blurred vision related to her blood sugars.        She has noticed that she has continued issues with both falling asleep as well as staying asleep.  Sometimes even after she thinks that she has gotten adequate sleep, she still feels tired the next day.  She is not sure if she snores.  She does have occasional morning headaches.  She denies any episodes of falling asleep or feeling as if she will fall asleep while driving.        On review of systems, she denies any issues with chest pain or  palpitations, no shortness of breath or cough.  She does have some chronic bilateral lower extremity swelling but no acute worsening.  No abdominal pain-no nausea/vomiting.  No blood in the stool no nosebleeds and no blood in the urine.   Past Medical History:  Diagnosis Date  . Diabetes mellitus without complication ([place])   . DVT (deep venous thrombosis) ([place]) 03/2018  . Gallstones   . Headache(784.0)   . No pertinent past medical history     Past Surgical History:  Procedure Laterality Date  . CHOLECYSTECTOMY N/A 03/12/2012   Procedure: LAPAROSCOPIC CHOLECYSTECTOMY;  Surgeon: Harl Bowie, MD;  Location: [facility] OR;  Service: General;  Laterality: N/A;    Family History  Problem Relation Age of Onset  . Healthy Mother   . Ulcerative colitis Father   . Diabetes Maternal Grandmother   . Breast cancer Maternal Grandmother   . Syncope episode Maternal Grandfather   . Benign prostatic hyperplasia Maternal Grandfather   . Brain cancer Paternal Grandmother   . Lung cancer Paternal Grandfather   . Anesthesia problems Neg Hx   . Hypotension Neg Hx   . Malignant hyperthermia Neg Hx   . Pseudochol deficiency Neg Hx     Social History   Tobacco Use  . Smoking status: Former Smoker    Types: Cigarettes    Quit date: 05/24/2011    Years since quitting: 7.5  . Smokeless tobacco: Never Used  Substance Use Topics  . Alcohol use:  Yes    Comment: Once a month on average  . Drug use: No     No Known Allergies     Observations/Objective: No vital signs or physical exam conducted as visit was done via telephone  Assessment and Plan: 1. Essential hypertension She is to continue the use of amlodipine, hydrochlorothiazide and losartan for treatment of hypertension.  She is encouraged to follow a low-sodium diet as well as engage in regular cardiovascular exercise.  She will also be scheduled for sleep study as obstructive sleep apnea may be contributing to her hypertension. - Split night  study; Future  2. Type 2 diabetes mellitus without complication, without long-term current use of insulin ([facility]) Her most recent hemoglobin A1c was 7.7 on 09/09/2018.  Discussed goal of A1c of 7 or less to help prevent long-term complications from diabetes.  She is to continue the use of metformin in addition to a low carbohydrate diet and she is encouraged to exercise on a regular basis.  She does not wish to add additional medication at this time.  3. History of deep vein thrombosis (DVT) of lower extremity; recurrent DVT; history of pulmonary embolism She did follow-up with hematology and was seen 11/19/2018.  Patient's hematology note was reviewed.  Patient is aware that because she has now had DVT x2 as well as pulmonary embolism that it has been recommended that she be on chronic anticoagulant therapy with Xarelto.  She is encouraged to keep follow-up appointment with hematology as she is also undergoing work-up for hypercoagulable state.  4. Sleep disorder; 6. Insomnia; 7. Morbid Obesity Patient with complaint of difficulty with sleeping, sleep that is nonrestorative/nonrefreshing as well as daytime fatigue.  Discussed with patient that her symptoms may be related to obstructive sleep apnea as she is also morbidly obese which is a risk factor for sleep apnea.  She will be scheduled for split-night sleep study. - Split night study; Future  5. Long term current use of anticoagulant therapy She has had recent CBC on 11/19/2018 with normal hemoglobin of 14.3 and normal platelet count of 254.  She denies any current issues with unusual bruising or bleeding.  Continue use of blood thinning medication.   Follow Up Instructions:Return in about 2 months (around 02/08/2019) for Diabetes/hypertension/chronic issues.    I discussed the assessment and treatment plan with the patient. The patient was provided an opportunity to ask questions and all were answered. The patient agreed with the plan and  demonstrated an understanding of the instructions.   The patient was advised to call back or seek an in-person evaluation if the symptoms worsen or if the condition fails to improve as anticipated.  I provided 16 minutes of non-face-to-face time during this encounter.   Antony Blackbird, MD

## 2018-12-21 ENCOUNTER — Encounter: Payer: Self-pay | Admitting: Family Medicine

## 2018-12-24 ENCOUNTER — Telehealth: Payer: Self-pay | Admitting: Family Medicine

## 2018-12-24 ENCOUNTER — Other Ambulatory Visit: Payer: Self-pay | Admitting: Family Medicine

## 2018-12-24 DIAGNOSIS — G479 Sleep disorder, unspecified: Secondary | ICD-10-CM

## 2018-12-24 DIAGNOSIS — I10 Essential (primary) hypertension: Secondary | ICD-10-CM

## 2018-12-24 NOTE — Progress Notes (Signed)
Patient ID: Whitney [name], female   DOB: September 14, 1985, 33 y.o.   MRN: [id number]   Message received that patient's insurance will only cover a home sleep study therefore new order will be placed for home sleep study

## 2018-12-24 NOTE — Telephone Encounter (Signed)
[place] from [facility] called requesting an order changed, they state [place] health care will only approve a home sleep test. Please change order to procede with this option. If you have any question please reach out   Arrey p

## 2018-12-24 NOTE — Telephone Encounter (Signed)
New order placed for home sleep study

## 2018-12-24 NOTE — Telephone Encounter (Signed)
Please update order

## 2018-12-25 ENCOUNTER — Ambulatory Visit (INDEPENDENT_AMBULATORY_CARE_PROVIDER_SITE_OTHER): Payer: 59

## 2018-12-25 ENCOUNTER — Other Ambulatory Visit: Payer: Self-pay

## 2018-12-25 DIAGNOSIS — I10 Essential (primary) hypertension: Secondary | ICD-10-CM

## 2018-12-25 NOTE — Progress Notes (Signed)
Patient here for BP check.  Hasn't been checking BP at home. Has taken BP medication today. Denies chest pain, SHOB, palpitations, dizziness, lower extremity swelling.  BP was 142/96, pulse 89.

## 2018-12-31 ENCOUNTER — Telehealth: Payer: Self-pay

## 2018-12-31 NOTE — Telephone Encounter (Signed)
Call placed to [facility] who explained that they need to contact patient to schedule the home sleep study

## 2019-01-30 ENCOUNTER — Other Ambulatory Visit: Payer: Self-pay

## 2019-01-30 ENCOUNTER — Ambulatory Visit (HOSPITAL_BASED_OUTPATIENT_CLINIC_OR_DEPARTMENT_OTHER): Payer: 59 | Attending: Family Medicine | Admitting: Internal Medicine

## 2019-01-30 DIAGNOSIS — G479 Sleep disorder, unspecified: Secondary | ICD-10-CM | POA: Diagnosis not present

## 2019-01-30 DIAGNOSIS — I10 Essential (primary) hypertension: Secondary | ICD-10-CM | POA: Insufficient documentation

## 2019-01-30 DIAGNOSIS — G4733 Obstructive sleep apnea (adult) (pediatric): Secondary | ICD-10-CM

## 2019-01-31 ENCOUNTER — Telehealth: Payer: Self-pay

## 2019-01-31 NOTE — Telephone Encounter (Signed)

## 2019-02-03 ENCOUNTER — Ambulatory Visit (INDEPENDENT_AMBULATORY_CARE_PROVIDER_SITE_OTHER): Payer: 59 | Admitting: Family Medicine

## 2019-02-03 ENCOUNTER — Other Ambulatory Visit: Payer: Self-pay

## 2019-02-03 VITALS — BP 166/113 | HR 92 | Temp 97.2°F | Resp 17 | Wt 369.0 lb

## 2019-02-03 DIAGNOSIS — I82402 Acute embolism and thrombosis of unspecified deep veins of left lower extremity: Secondary | ICD-10-CM | POA: Diagnosis not present

## 2019-02-03 DIAGNOSIS — I10 Essential (primary) hypertension: Secondary | ICD-10-CM

## 2019-02-03 DIAGNOSIS — E119 Type 2 diabetes mellitus without complications: Secondary | ICD-10-CM

## 2019-02-03 DIAGNOSIS — F5101 Primary insomnia: Secondary | ICD-10-CM

## 2019-02-03 DIAGNOSIS — R748 Abnormal levels of other serum enzymes: Secondary | ICD-10-CM

## 2019-02-03 DIAGNOSIS — Z79899 Other long term (current) drug therapy: Secondary | ICD-10-CM

## 2019-02-03 DIAGNOSIS — G479 Sleep disorder, unspecified: Secondary | ICD-10-CM

## 2019-02-03 DIAGNOSIS — Z7901 Long term (current) use of anticoagulants: Secondary | ICD-10-CM

## 2019-02-03 DIAGNOSIS — Z6841 Body Mass Index (BMI) 40.0 and over, adult: Secondary | ICD-10-CM

## 2019-02-03 LAB — GLUCOSE, POCT (MANUAL RESULT ENTRY): POC Glucose: 306 mg/dL — AB (ref 70–99)

## 2019-02-03 MED ORDER — LANTUS SOLOSTAR 100 UNIT/ML ~~LOC~~ SOPN: 15.0000 [IU] | PEN_INJECTOR | Freq: Every day | SUBCUTANEOUS | 3 refills | Status: DC

## 2019-02-03 MED ORDER — IRBESARTAN 150 MG PO TABS: 150.0000 mg | ORAL_TABLET | Freq: Every day | ORAL | 4 refills | Status: DC

## 2019-02-03 NOTE — Progress Notes (Signed)
Subjective:  Patient ID: Whitney [name], female    DOB: May 10, 1985  Age: 34 y.o. MRN: [id number]  CC: Diabetes, Hypertension (BP readings are still elevated at home(150s/90s-100s)), and Insomnia (did sleep study last week)   HPI Whitney [name], 34 year old female who presents in follow-up of type 2 diabetes with most recent hemoglobin A1c of 7.7 on 09/09/2018, hypertension, history of recurrent DVT with long-term use of anticoagulant, Xarelto, chronic insomnia, sleep disturbance for which she is currently status post sleep study on 01/30/2019 however the results were not yet available and patient with elevated LFTs on most recent blood work.        She reports that she has been compliant with her medications.  She has not been checking her home blood sugars as she states that she was told in the past that she should not stick her fingers to check her blood sugar while being on blood thinning medication.  She has had some episodes of increased thirst as well as urinary frequency.  She is currently taking her Xarelto and did follow-up with hematology and she is aware that she will be on blood thinning medication chronically at this time.  She has had no unusual bruising or bleeding-no blood in the urine, no blood in the stool, no nosebleeds or no bleeding when brushing her gums.  She reports no new episodes of leg pain, leg swelling, chest pain or issues with shortness of breath.  She has been checking her home blood pressures and they continue to be elevated in the 150s over 90s 200s.  She denies any headaches or dizziness related to her blood pressure.  She did go to her appointment for sleep study but reports that she did not sleep well as they had her lie on her back to sleep and she reports that she actually woke herself up due to snoring.  She is not sure that her test was accurate.  Past Medical History:  Diagnosis Date  . Diabetes mellitus without complication ([place])   . DVT (deep venous thrombosis)  ([place]) 03/2018  . Gallstones   . Headache(784.0)   . No pertinent past medical history     Past Surgical History:  Procedure Laterality Date  . CHOLECYSTECTOMY N/A 03/12/2012   Procedure: LAPAROSCOPIC CHOLECYSTECTOMY;  Surgeon: Harl Bowie, MD;  Location: [facility] OR;  Service: General;  Laterality: N/A;    Family History  Problem Relation Age of Onset  . Healthy Mother   . Ulcerative colitis Father   . Diabetes Maternal Grandmother   . Breast cancer Maternal Grandmother   . Syncope episode Maternal Grandfather   . Benign prostatic hyperplasia Maternal Grandfather   . Brain cancer Paternal Grandmother   . Lung cancer Paternal Grandfather   . Anesthesia problems Neg Hx   . Hypotension Neg Hx   . Malignant hyperthermia Neg Hx   . Pseudochol deficiency Neg Hx     Social History   Tobacco Use  . Smoking status: Former Smoker    Types: Cigarettes    Quit date: 05/24/2011    Years since quitting: 7.7  . Smokeless tobacco: Never Used  Substance Use Topics  . Alcohol use: Yes    Comment: Once a month on average    ROS Review of Systems  Constitutional: Negative for chills, fatigue and fever.  HENT: Negative for sore throat and trouble swallowing.   Eyes: Negative for photophobia and visual disturbance.  Respiratory: Negative for cough and shortness of breath.  Cardiovascular: Negative for chest pain and palpitations.  Gastrointestinal: Negative for abdominal pain, constipation, diarrhea and nausea.  Endocrine: Positive for polydipsia (occasional) and polyuria (occasional).  Genitourinary: Positive for frequency. Negative for dysuria.  Musculoskeletal: Negative for arthralgias and back pain.  Skin: Negative for rash and wound.  Neurological: Negative for dizziness and headaches.  Hematological: Negative for adenopathy. Does not bruise/bleed easily.  Psychiatric/Behavioral: Negative for self-injury and suicidal ideas.    Objective:   Today's Vitals: BP (!) 166/113    Pulse 92   Temp (!) 97.2 F (36.2 C) (Temporal)   Resp 17   Wt (!) 369 lb (167.4 kg)   SpO2 96%   BMI 51.47 kg/m   Physical Exam Constitutional:      Appearance: She is obese.  Neck:     Vascular: No carotid bruit.  Cardiovascular:     Rate and Rhythm: Normal rate and regular rhythm.     Pulses:          Dorsalis pedis pulses are 1+ on the right side and 1+ on the left side.       Posterior tibial pulses are 1+ on the right side and 1+ on the left side.  Pulmonary:     Effort: Pulmonary effort is normal.     Breath sounds: Normal breath sounds.  Abdominal:     Palpations: Abdomen is soft.     Tenderness: There is no abdominal tenderness. There is no right CVA tenderness, left CVA tenderness, guarding or rebound.  Musculoskeletal:        General: No swelling or tenderness.     Cervical back: Normal range of motion and neck supple. No rigidity or tenderness.     Right lower leg: No edema.     Left lower leg: No edema.     Right foot: Normal range of motion. No deformity, bunion, Charcot foot, foot drop or prominent metatarsal heads.     Left foot: Normal range of motion. No deformity, bunion, Charcot foot, foot drop or prominent metatarsal heads.  Feet:     Right foot:     Protective Sensation: 10 sites tested. 10 sites sensed.     Skin integrity: Callus and dry skin present. No ulcer, blister, skin breakdown, erythema, warmth or fissure.     Toenail Condition: Right toenails are abnormally thick. Fungal disease present.    Left foot:     Protective Sensation: 10 sites tested. 10 sites sensed.     Skin integrity: Callus and dry skin present. No ulcer, blister, skin breakdown, erythema, warmth or fissure.     Toenail Condition: Left toenails are abnormally thick. Fungal disease present.    Comments: Decreased sensation left heel; thickened discolored bilateral great toenails Lymphadenopathy:     Cervical: No cervical adenopathy.  Skin:    General: Skin is warm and dry.   Neurological:     General: No focal deficit present.     Mental Status: She is alert and oriented to person, place, and time.  Psychiatric:        Mood and Affect: Mood normal.        Behavior: Behavior normal.     Assessment & Plan:  1. Type 2 diabetes mellitus without complication, without long-term current use of insulin ([place]) She reports that at some point she was told that she should not stick her fingers to monitor her blood sugars while on anticoagulant medication.  She therefore does not know how her blood sugars have been running  recently.  She has had some increased thirst as well as urinary frequency at times.  At today's visit, she had nonfasting glucose of 306 and she reports that approximately 1-1/2 to 2 hours prior to the visit she had a green juice smoothie.  Discussed with the patient that her blood sugars have likely been elevated over the past 90 days or more as on recent blood work such as CMP done 11/19/2018 by hematology, blood sugar was elevated at 324.  She will have hemoglobin A1c at today's visit as well as repeat comprehensive metabolic panel and urine microalbumin creatinine.  Patient reports that she has to crush all medications and therefore would prefer to take insulin rather than add an additional pill.  Initial plan had been to have patient start Amaryl in addition to her Metformin.  She reports that she actually has been taking the Metformin but discussed with the patient that if she is having smoothies on a daily basis with frozen fruit that this is likely contributing to her elevated blood sugars.  She will be referred to endocrinology for further evaluation and treatment of her diabetes and she has also been asked to follow-up in 2 weeks with clinical pharmacist and bring her glucometer as well as a blood sugar diary and follow-up of diabetes and hypertension.  She is to initially start Lantus 10 units once daily and has been asked to check her blood sugars at least  once daily but she will likely need to further titrate dose of Lantus.  She will be contacted regarding her lab results. - Microalbumin/Creatinine Ratio, Urine - Glucose (CBG) - Comprehensive metabolic panel - Hemoglobin A1C - Insulin Glargine (LANTUS SOLOSTAR) 100 UNIT/ML Solostar Pen; Inject 15 Units into the skin daily.  Dispense: 5 pen; Refill: 3 - Ambulatory referral to Endocrinology - Amb Referral to Clinical Pharmacist  2. Essential hypertension Will have patient stop losartan 100 mg (she was taking two 50 mg losartan daily).  She reports that she has been taking her losartan as well as amlodipine 5 mg daily and hydrochlorothiazide.  Again, because she states that she has to crush all of her medications, I am concerned that she may not be taking all of her medications on a daily basis.  Losartan will be discontinued and she will take Avapro 150 mg along with amlodipine 5 mg.  As patient has had past issues with DVT and lower extremity edema therefore if her blood pressure remains elevated will likely increase Avapro to 300 mg rather than increasing amlodipine to 10 mg daily which can increased edema for some patients. - irbesartan (AVAPRO) 150 MG tablet; Take 1 tablet (150 mg total) by mouth daily. To lower blood pressure  Dispense: 30 tablet; Refill: 4 - Amb Referral to Clinical Pharmacist  3. Primary insomnia; sleep disturbance Patient with issues with insomnia and is now status post sleep study however the results are not yet available.  She will be notified of the results and if CPAP therapy is indicated to help improve sleep.  4. 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 Long term use of anticoagulant Patient has been seen by hematology and as she has had DVT x2 of the left lower extremity and different veins, she has been encouraged to continue use of Xarelto long-term.  Discussed with patient that hematology wanted her to make a follow-up appointment  in 3 months which she was not aware of and she was encouraged to call and schedule  follow-up appointment.  She will have CBC at today's visit and follow-up of long-term use of anticoagulant which can cause anemia and decreased platelets. -CBC  5. Elevated liver enzymes; 9.  Morbid obesity-BMI 51 We will repeat liver enzymes as part of comprehensive metabolic panel at today's visit as patient with elevated liver enzymes on recent comprehensive metabolic panel's with most recent comprehensive metabolic panel done [id number] by hematology and patient with AST of 71 and ALT of 73.  On review of chart, she has had prior imaging showing fatty infiltration of the liver.  Weight loss and dietary changes recommended.  She may need repeat liver ultrasound and/or GI referral if liver enzymes remain elevated.  7. Encounter for long-term current use of medication She will have comprehensive metabolic panel at today's visit and follow-up of long-term use of medications for treatment of hypertension and diabetes.  Outpatient Encounter Medications as of 02/03/2019  Medication Sig  . amLODipine (NORVASC) 5 MG tablet Take 1 tablet (5 mg total) by mouth daily. To lower blood sugar (Patient taking differently: Take 5 mg by mouth daily with supper. To lower blood pressure)  . Biotin 1 MG CAPS Take 1 mg by mouth daily with breakfast.   . hydrochlorothiazide (HYDRODIURIL) 25 MG tablet Take 1 tablet (25 mg total) by mouth daily. (Patient taking differently: Take 25 mg by mouth daily with supper. )  . losartan (COZAAR) 50 MG tablet Take 2 tablets (100 mg total) by mouth daily. (Patient taking differently: Take 100 mg by mouth daily with supper. )  . metFORMIN (GLUCOPHAGE) 1000 MG tablet Take 1 tablet (1,000 mg total) by mouth 2 (two) times daily with a meal. (Patient taking differently: Take 200 mg by mouth daily with supper. )  . Multiple Vitamins-Minerals (HAIR/SKIN/NAILS) TABS Take 1 tablet by mouth daily with breakfast.   . Rivaroxaban 15 & 20 MG TBPK Follow package directions: Take one 15mg  tablet by mouth twice a day. On day 22, switch to one 20mg  tablet once a day. Take with food.  . traZODone (DESYREL) 50 MG tablet Take 0.5-1 tablets (25-50 mg total) by mouth at bedtime as needed for sleep.   No facility-administered encounter medications on file as of 02/03/2019.    An After Visit Summary was printed and given to the patient.   Follow-up: Return for DM/HTN- 2 weeks with Lurena Joiner; 4-6 weeks with PCP at [facility].  Antony Blackbird MD

## 2019-02-03 NOTE — Patient Instructions (Signed)
Diabetes Basics  Diabetes (diabetes mellitus) is a long-term (chronic) disease. It occurs when the body does not properly use sugar (glucose) that is released from food after you eat. Diabetes may be caused by one or both of these problems:  Your pancreas does not make enough of a hormone called insulin.  Your body does not react in a normal way to insulin that it makes. Insulin lets sugars (glucose) go into cells in your body. This gives you energy. If you have diabetes, sugars cannot get into cells. This causes high blood sugar (hyperglycemia). Follow these instructions at home: How is diabetes treated? You may need to take insulin or other diabetes medicines daily to keep your blood sugar in balance. Take your diabetes medicines every day as told by your doctor. List your diabetes medicines here: Diabetes medicines  Name of medicine: ______________________________ ? Amount (dose): _______________ Time (a.m./p.m.): _______________ Notes: ___________________________________  Name of medicine: ______________________________ ? Amount (dose): _______________ Time (a.m./p.m.): _______________ Notes: ___________________________________  Name of medicine: ______________________________ ? Amount (dose): _______________ Time (a.m./p.m.): _______________ Notes: ___________________________________ If you use insulin, you will learn how to give yourself insulin by injection. You may need to adjust the amount based on the food that you eat. List the types of insulin you use here: Insulin  Insulin type: ______________________________ ? Amount (dose): _______________ Time (a.m./p.m.): _______________ Notes: ___________________________________  Insulin type: ______________________________ ? Amount (dose): _______________ Time (a.m./p.m.): _______________ Notes: ___________________________________  Insulin type: ______________________________ ? Amount (dose): _______________ Time (a.m./p.m.):  _______________ Notes: ___________________________________  Insulin type: ______________________________ ? Amount (dose): _______________ Time (a.m./p.m.): _______________ Notes: ___________________________________  Insulin type: ______________________________ ? Amount (dose): _______________ Time (a.m./p.m.): _______________ Notes: ___________________________________ How do I manage my blood sugar?  Check your blood sugar levels using a blood glucose monitor as directed by your doctor. Your doctor will set treatment goals for you. Generally, you should have these blood sugar levels:  Before meals (preprandial): 80-130 mg/dL (4.4-7.2 mmol/L).  After meals (postprandial): below 180 mg/dL (10 mmol/L).  A1c level: less than 7%. Write down the times that you will check your blood sugar levels: Blood sugar checks  Time: _______________ Notes: ___________________________________  Time: _______________ Notes: ___________________________________  Time: _______________ Notes: ___________________________________  Time: _______________ Notes: ___________________________________  Time: _______________ Notes: ___________________________________  Time: _______________ Notes: ___________________________________  What do I need to know about low blood sugar? Low blood sugar is called hypoglycemia. This is when blood sugar is at or below 70 mg/dL (3.9 mmol/L). Symptoms may include:  Feeling: ? Hungry. ? Worried or nervous (anxious). ? Sweaty and clammy. ? Confused. ? Dizzy. ? Sleepy. ? Sick to your stomach (nauseous).  Having: ? A fast heartbeat. ? A headache. ? A change in your vision. ? Tingling or no feeling (numbness) around the mouth, lips, or tongue. ? Jerky movements that you cannot control (seizure).  Having trouble with: ? Moving (coordination). ? Sleeping. ? Passing out (fainting). ? Getting upset easily (irritability). Treating low blood sugar To treat low blood  sugar, eat or drink something sugary right away. If you can think clearly and swallow safely, follow the 15:15 rule:  Take 15 grams of a fast-acting carb (carbohydrate). Talk with your doctor about how much you should take.  Some fast-acting carbs are: ? Sugar tablets (glucose pills). Take 3-4 glucose pills. ? 6-8 pieces of hard candy. ? 4-6 oz (120-150 mL) of fruit juice. ? 4-6 oz (120-150 mL) of regular (not diet) soda. ? 1 Tbsp (15 mL) honey or sugar.    Check your blood sugar 15 minutes after you take the carb.  If your blood sugar is still at or below 70 mg/dL (3.9 mmol/L), take 15 grams of a carb again.  If your blood sugar does not go above 70 mg/dL (3.9 mmol/L) after 3 tries, get help right away.  After your blood sugar goes back to normal, eat a meal or a snack within 1 hour. Treating very low blood sugar If your blood sugar is at or below 54 mg/dL (3 mmol/L), you have very low blood sugar (severe hypoglycemia). This is an emergency. Do not wait to see if the symptoms will go away. Get medical help right away. Call your local emergency services (911 in the [place] Do not drive yourself to the hospital. Questions to ask your health care provider  Do I need to meet with a diabetes educator?  What equipment will I need to care for myself at home?  What diabetes medicines do I need? When should I take them?  How often do I need to check my blood sugar?  What number can I call if I have questions?  When is my next doctor's visit?  Where can I find a support group for people with diabetes? Where to find more information  American Diabetes Association: www.diabetes.org  American Association of Diabetes Educators: www.diabeteseducator.org/patient-resources Contact a doctor if:  Your blood sugar is at or above 240 mg/dL (13.3 mmol/L) for 2 days in a row.  You have been sick or have had a fever for 2 days or more, and you are not getting better.  You have any of these  problems for more than 6 hours: ? You cannot eat or drink. ? You feel sick to your stomach (nauseous). ? You throw up (vomit). ? You have watery poop (diarrhea). Get help right away if:  Your blood sugar is lower than 54 mg/dL (3 mmol/L).  You get confused.  You have trouble: ? Thinking clearly. ? Breathing. Summary  Diabetes (diabetes mellitus) is a long-term (chronic) disease. It occurs when the body does not properly use sugar (glucose) that is released from food after digestion.  Take insulin and diabetes medicines as told.  Check your blood sugar every day, as often as told.  Keep all follow-up visits as told by your doctor. This is important. This information is not intended to replace advice given to you by your health care provider. Make sure you discuss any questions you have with your health care provider. Document Revised: 10/02/2018 Document Reviewed: 04/13/2017 Elsevier Patient Education  2020 [place] for Diabetes Mellitus, Adult  Carbohydrate counting is a method of keeping track of how many carbohydrates you eat. Eating carbohydrates naturally increases the amount of sugar (glucose) in the blood. Counting how many carbohydrates you eat helps keep your blood glucose within normal limits, which helps you manage your diabetes (diabetes mellitus). It is important to know how many carbohydrates you can safely have in each meal. This is different for every person. A diet and nutrition specialist (registered dietitian) can help you make a meal plan and calculate how many carbohydrates you should have at each meal and snack. Carbohydrates are found in the following foods:  Grains, such as breads and cereals.  Dried beans and soy products.  Starchy vegetables, such as potatoes, peas, and corn.  Fruit and fruit juices.  Milk and yogurt.  Sweets and snack foods, such as cake, cookies, candy, chips, and soft drinks. How do I  count  carbohydrates? There are two ways to count carbohydrates in food. You can use either of the methods or a combination of both. Reading "Nutrition Facts" on packaged food The "Nutrition Facts" list is included on the labels of almost all packaged foods and beverages in the [place] It includes:  The serving size.  Information about nutrients in each serving, including the grams (g) of carbohydrate per serving. To use the "Nutrition Facts":  Decide how many servings you will have.  Multiply the number of servings by the number of carbohydrates per serving.  The resulting number is the total amount of carbohydrates that you will be having. Learning standard serving sizes of other foods When you eat carbohydrate foods that are not packaged or do not include "Nutrition Facts" on the label, you need to measure the servings in order to count the amount of carbohydrates:  Measure the foods that you will eat with a food scale or measuring cup, if needed.  Decide how many standard-size servings you will eat.  Multiply the number of servings by 15. Most carbohydrate-rich foods have about 15 g of carbohydrates per serving. ? For example, if you eat 8 oz (170 g) of strawberries, you will have eaten 2 servings and 30 g of carbohydrates (2 servings x 15 g = 30 g).  For foods that have more than one food mixed, such as soups and casseroles, you must count the carbohydrates in each food that is included. The following list contains standard serving sizes of common carbohydrate-rich foods. Each of these servings has about 15 g of carbohydrates:   hamburger bun or  English muffin.   oz (15 mL) syrup.   oz (14 g) jelly.  1 slice of bread.  1 six-inch tortilla.  3 oz (85 g) cooked rice or pasta.  4 oz (113 g) cooked dried beans.  4 oz (113 g) starchy vegetable, such as peas, corn, or potatoes.  4 oz (113 g) hot cereal.  4 oz (113 g) mashed potatoes or  of a large baked potato.  4 oz (113  g) canned or frozen fruit.  4 oz (120 mL) fruit juice.  4-6 crackers.  6 chicken nuggets.  6 oz (170 g) unsweetened dry cereal.  6 oz (170 g) plain fat-free yogurt or yogurt sweetened with artificial sweeteners.  8 oz (240 mL) milk.  8 oz (170 g) fresh fruit or one small piece of fruit.  24 oz (680 g) popped popcorn. Example of carbohydrate counting Sample meal  3 oz (85 g) chicken breast.  6 oz (170 g) brown rice.  4 oz (113 g) corn.  8 oz (240 mL) milk.  8 oz (170 g) strawberries with sugar-free whipped topping. Carbohydrate calculation 1. Identify the foods that contain carbohydrates: ? Rice. ? Corn. ? Milk. ? Strawberries. 2. Calculate how many servings you have of each food: ? 2 servings rice. ? 1 serving corn. ? 1 serving milk. ? 1 serving strawberries. 3. Multiply each number of servings by 15 g: ? 2 servings rice x 15 g = 30 g. ? 1 serving corn x 15 g = 15 g. ? 1 serving milk x 15 g = 15 g. ? 1 serving strawberries x 15 g = 15 g. 4. Add together all of the amounts to find the total grams of carbohydrates eaten: ? 30 g + 15 g + 15 g + 15 g = 75 g of carbohydrates total. Summary  Carbohydrate counting is a method of  keeping track of how many carbohydrates you eat.  Eating carbohydrates naturally increases the amount of sugar (glucose) in the blood.  Counting how many carbohydrates you eat helps keep your blood glucose within normal limits, which helps you manage your diabetes.  A diet and nutrition specialist (registered dietitian) can help you make a meal plan and calculate how many carbohydrates you should have at each meal and snack. This information is not intended to replace advice given to you by your health care provider. Make sure you discuss any questions you have with your health care provider. Document Revised: 08/03/2016 Document Reviewed: 06/23/2015 Elsevier Patient Education  [place].

## 2019-02-04 ENCOUNTER — Other Ambulatory Visit: Payer: Self-pay | Admitting: Family Medicine

## 2019-02-04 DIAGNOSIS — R7989 Other specified abnormal findings of blood chemistry: Secondary | ICD-10-CM

## 2019-02-04 DIAGNOSIS — R945 Abnormal results of liver function studies: Secondary | ICD-10-CM

## 2019-02-04 LAB — COMPREHENSIVE METABOLIC PANEL WITH GFR
ALT: 83 IU/L — ABNORMAL HIGH (ref 0–32)
AST: 66 IU/L — ABNORMAL HIGH (ref 0–40)
Albumin/Globulin Ratio: 1.3 (ref 1.2–2.2)
Albumin: 4.1 g/dL (ref 3.8–4.8)
Alkaline Phosphatase: 70 IU/L (ref 39–117)
BUN/Creatinine Ratio: 9 (ref 9–23)
BUN: 7 mg/dL (ref 6–20)
Bilirubin Total: 0.3 mg/dL (ref 0.0–1.2)
CO2: 24 mmol/L (ref 20–29)
Calcium: 10.5 mg/dL — ABNORMAL HIGH (ref 8.7–10.2)
Chloride: 101 mmol/L (ref 96–106)
Creatinine, Ser: 0.75 mg/dL (ref 0.57–1.00)
GFR calc Af Amer: 120 mL/min/1.73
GFR calc non Af Amer: 104 mL/min/1.73
Globulin, Total: 3.1 g/dL (ref 1.5–4.5)
Glucose: 262 mg/dL — ABNORMAL HIGH (ref 65–99)
Potassium: 4 mmol/L (ref 3.5–5.2)
Sodium: 137 mmol/L (ref 134–144)
Total Protein: 7.2 g/dL (ref 6.0–8.5)

## 2019-02-04 LAB — CBC
Hematocrit: 42.6 % (ref 34.0–46.6)
Hemoglobin: 14.3 g/dL (ref 11.1–15.9)
MCH: 27.1 pg (ref 26.6–33.0)
MCHC: 33.6 g/dL (ref 31.5–35.7)
MCV: 81 fL (ref 79–97)
Platelets: 270 x10E3/uL (ref 150–450)
RBC: 5.28 x10E6/uL (ref 3.77–5.28)
RDW: 12.9 % (ref 11.7–15.4)
WBC: 6.6 x10E3/uL (ref 3.4–10.8)

## 2019-02-04 LAB — MICROALBUMIN / CREATININE URINE RATIO
Creatinine, Urine: 124 mg/dL
Microalb/Creat Ratio: 20 mg/g{creat} (ref 0–29)
Microalbumin, Urine: 24.2 ug/mL

## 2019-02-04 LAB — HEMOGLOBIN A1C
Est. average glucose Bld gHb Est-mCnc: 255 mg/dL
Hgb A1c MFr Bld: 10.5 % — ABNORMAL HIGH (ref 4.8–5.6)

## 2019-02-12 ENCOUNTER — Encounter: Payer: Self-pay | Admitting: Nurse Practitioner

## 2019-02-15 DIAGNOSIS — I10 Essential (primary) hypertension: Secondary | ICD-10-CM | POA: Diagnosis not present

## 2019-02-15 DIAGNOSIS — G479 Sleep disorder, unspecified: Secondary | ICD-10-CM | POA: Diagnosis not present

## 2019-02-15 NOTE — Procedures (Signed)
    Patient Name: Whitney [name], Graft Date: 01/31/2019 Gender: Female D.O.B: 03-20-1985 Age (years): 33 Referring Provider: Cammie Fulp Height (inches): 71 Interpreting Physician: Baird Lyons MD, ABSM Weight (lbs): 306 RPSGT: Jacolyn Reedy BMI: 43 MRN: [id number] Neck Size: 16.00  CLINICAL INFORMATION Sleep Study Type: HST Indication for sleep study: Hypertension Epworth Sleepiness Score: 8  SLEEP STUDY TECHNIQUE A multi-channel overnight portable sleep study was performed. The channels recorded were: nasal airflow, thoracic respiratory movement, and oxygen saturation with a pulse oximetry. Snoring was also monitored.  MEDICATIONS Patient self administered medications include: none reported  SLEEP ARCHITECTURE Patient was studied for 322.3 minutes. The sleep efficiency was 100.0 % and the patient was supine for 0%. The arousal index was 0.0 per hour.  RESPIRATORY PARAMETERS The overall AHI was 11.5 per hour, with a central apnea index of 0.0 per hour. The oxygen nadir was 83% during sleep.  CARDIAC DATA Mean heart rate during sleep was 85.3 bpm.  IMPRESSIONS - Mild obstructive sleep apnea occurred during this study (AHI = 11.5/h). - No significant central sleep apnea occurred during this study (CAI = 0.0/h). - Moderate oxygen desaturation was noted during this study (Min O2 = 83%). Mean sat 95%. - Patient snored.  DIAGNOSIS - Obstructive Sleep Apnea (327.23 [G47.33 ICD-10])  RECOMMENDATIONS - Treatment for mild OSA is directed at symptoms. Conservative measures might include observation, weight loss and sleep positionoff back. - Other options, including CPAP, a fitted oral appliance or ENT evaluation, would be based on clinical judgment. - Be careful with alcohol, sedatives and other CNS depressants that may worsen sleep apnea and disrupt normal sleep architecture. - Sleep hygiene should be reviewed to assess factors that may improve sleep quality. - Weight  management and regular exercise should be initiated or continued.  [Electronically signed] 02/15/2019 11:12 AM  Baird Lyons MD, ABSM Diplomate, American Board of Sleep Medicine   NPI: [id number]                         [facility] [place] [facility]  ELECTRONICALLY SIGNED ON:  02/15/2019, 11:09 AM [place] PH: [phone]   FX: [phone] [place]

## 2019-02-17 ENCOUNTER — Telehealth: Payer: Self-pay

## 2019-02-17 NOTE — Telephone Encounter (Signed)
Call placed to the patient to review the results of the sleep study. Conservative measures as well as the option of a CPAP were discussed.  The patient said that she does not have a problem sleeping but her issue is falling asleep.  She said she is able to stay asleep without any problem. She avoids sedatives and does not consume alcohol.  She also noted that she does not sleep on her back.   Informed her that this CM would notify her PCP of her concern about falling asleep.

## 2019-02-17 NOTE — Telephone Encounter (Signed)
I would recommend that she try melatonin. Because of her sleep apnea she should avoid sedative medication. She should consider CPAP therapy if her sleep issues persist.

## 2019-02-18 NOTE — Telephone Encounter (Signed)
Unfortunately because she has sleep apnea the recommendation is that she not use any sedative type medications for sleep so she may want to obtain CPAP to see if use of the device might help her improve initiation of sleep

## 2019-02-19 NOTE — Telephone Encounter (Signed)
-----   Message from Antony Blackbird, MD sent at 02/17/2019 12:12 PM EST ----- Sleep study was consistent with mild obstructive sleep apnea. The nurse case manager will contact patient to see if patient is interested in using CPAP to treat her sleep apnea versus measures such as weight loss, sleeping on side, or oral dental appliance

## 2019-02-19 NOTE — Telephone Encounter (Signed)
Patient verified DOB Patient is aware of CPAP being recommended VS weight loss, sleeping on her side and oral dental appliance. Patient shares that she declines the use of a CPAP and already sleeps on her side. Patient was advised the only other way to tackle the concern would be weight loss due to a sedative not being medically appropriate due to being Dx with OSA.

## 2019-02-21 ENCOUNTER — Other Ambulatory Visit: Payer: Self-pay

## 2019-02-21 ENCOUNTER — Ambulatory Visit: Payer: 59 | Attending: Family Medicine | Admitting: Pharmacist

## 2019-02-21 VITALS — BP 170/101 | HR 88

## 2019-02-21 DIAGNOSIS — I10 Essential (primary) hypertension: Secondary | ICD-10-CM

## 2019-02-21 MED ORDER — IRBESARTAN 300 MG PO TABS: 300.0000 mg | ORAL_TABLET | Freq: Every day | ORAL | 0 refills | Status: DC

## 2019-02-21 NOTE — Progress Notes (Signed)
    S:    PCP: Dr. Juleen China  No chief complaint on file.  Patient arrives in good spirits.  Presents for diabetes evaluation, education, and management. Patient was referred on 02/03/2019 by Dr. Chapman Fitch.  Patient reports diabetes was diagnosed several years ago. She has been on metformin and, at one point, Trulicity but is unable to continue Trulicity d/t cost.  Family/Social History:  - FHx: DM - Tobacco: former smoker (quit in 2013) - Alcohol: occasionally   Insurance coverage/medication affordability: [place]  Patient denies adherence with medications.  Current diabetes medications include: Lantus 15 units daily (has not started), metformin 1000 mg BID Current hypertension medications include: amlodipine 5 mg, HCTZ 25 mg daily, irbesartan 150 mg daily  Current hyperlipidemia medications include: none   Patient denies hypoglycemic events.  Patient reported dietary habits:  - Pt admits to dietary indiscretion - Drinks diet or zero calorie sweetened beverages  - She drinks green smoothies; uses bananas and frozen berries  Patient-reported exercise habits:  - Patient recently started step classes 2x/week   Patient denies polyuria, polydipsia.  Patient denies neuropathy (nerve pain). Patient denies visual changes. Patient reports self foot exams.    O:  POCT: 222  Lab Results  Component Value Date   HGBA1C 10.5 (H) 02/03/2019   There were no vitals filed for this visit.  Lipid Panel     Component Value Date/Time   CHOL 137 05/22/2018 0920   TRIG 112 05/22/2018 0920   HDL 42 05/22/2018 0920   CHOLHDL 3.3 05/22/2018 0920   CHOLHDL 3 12/27/2015 1014   VLDL 13.2 12/27/2015 1014   LDLCALC 73 05/22/2018 0920   Home fasting blood sugars: not checking  2 hour post-meal/random blood sugars: not checking.  Reported home BP:  - SBPs: 140-150  Clinical Atherosclerotic Cardiovascular Disease (ASCVD): No  The ASCVD Risk score Mikey Bussing DC Jr., et al., 2013) failed to  calculate for the following reasons:   The 2013 ASCVD risk score is only valid for ages 59 to 64   A/P: Diabetes longstanding currently uncontrolled. Patient is able to verbalize appropriate hypoglycemia management plan. Patient is not adherent with medication. Control is suboptimal due to dietary indiscretion . -Start Lantus 15 units as prescribed. -Continue metformin 1000 mg BID -Encouraged to begin checking home CBGs  -Extensively discussed pathophysiology of diabetes, recommended lifestyle interventions, dietary effects on blood sugar control -Counseled on s/sx of and management of hypoglycemia -Next A1C anticipated 4/21.   Hypertension longstanding currently uncontrolled. Home BP above goal.  Blood pressure goal = <130/80 mmHg. Patient is adherent to medications.  -Increase irbesartan to 300 mg daily.   HM: UTD on PNA, influenza and tetanus vaccines.   Written patient instructions provided. Total time in face to face counseling 30 minutes.   Follow up Pharmacist Clinic Visit in 1 week.    Benard Halsted, PharmD, [place] [phone]

## 2019-02-24 ENCOUNTER — Other Ambulatory Visit (INDEPENDENT_AMBULATORY_CARE_PROVIDER_SITE_OTHER): Payer: 59

## 2019-02-24 ENCOUNTER — Encounter: Payer: Self-pay | Admitting: Nurse Practitioner

## 2019-02-24 ENCOUNTER — Ambulatory Visit (INDEPENDENT_AMBULATORY_CARE_PROVIDER_SITE_OTHER): Payer: 59 | Admitting: Nurse Practitioner

## 2019-02-24 VITALS — BP 130/70 | HR 85 | Temp 98.4°F | Ht 71.0 in | Wt 364.0 lb

## 2019-02-24 DIAGNOSIS — R945 Abnormal results of liver function studies: Secondary | ICD-10-CM | POA: Diagnosis not present

## 2019-02-24 DIAGNOSIS — R7989 Other specified abnormal findings of blood chemistry: Secondary | ICD-10-CM

## 2019-02-24 LAB — IBC + FERRITIN
Ferritin: 140.7 ng/mL (ref 10.0–291.0)
Iron: 91 ug/dL (ref 42–145)
Saturation Ratios: 28.6 % (ref 20.0–50.0)
Transferrin: 227 mg/dL (ref 212.0–360.0)

## 2019-02-24 NOTE — Progress Notes (Signed)
ASSESSMENT / PLAN:   35. 34 yo female with mildly elevated liver enzymes. Given HTN, obesity and DM she may have NAFLD but need to exclude other etiologies.  --Obtain RUQ U/S --We discussed weight loss and importance in management, it may help improve insulin resistance. She is on Metformin --Triglycerides are normal.  --Obtain labs for viral /autoimmune / genetic / metabolic markers of chronic liver disease including:   Hepatitis A ( IgM and IgG) Hepatitis B surface antigen Hepatitis B surface antibody Hepatitis B core ab HCV antibody Ferritin, TIBC ANA, AMA, Anti-smooth muscle antibody Alpha 1 antitrypsin Ceruloplasm  -- will call her with results when available.   2. Uncontrolled DM, recent A1c ~ 10. On Metformin. Started insulin a couple of days ago. Met with PharmD with [facility] last week. He is working with her to monitor CBGs / lifestyle modifications.   3. 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  Her liver enzymes were markedly abnormal in 2014 at the time of biliary pancreatitis. They subsequently normalized and remained normal until April 2020 at which time ALT was just minimally elevated. Labs repeated a couple of times in October 2020 and again last month with AST / ALT stable in the 60- 80 range. Remainder of liver tests remain normal. Patient hasn't started any new medications in the last several months. No [place] of liver disease. She seldom consume Etoh. No history of blood transfusions. Takes [employer] gummies, no herbs. No OTC medications.  She offers no GI complaints such as abdominal pain, N/V   Data Reviewed:  02/03/19  AST 66 / ALT 83  11/19/18 AST 71 / ALT 73  05/22/18 AST 19 / ALT 44  Past Medical History:  Diagnosis Date  . Diabetes mellitus without complication ([place])   . DVT (deep venous thrombosis) ([place]) 03/2018  . Gallstones   . Headache(784.0)   . Hypertension     Past Surgical History:  Procedure Laterality Date  . CHOLECYSTECTOMY N/A 03/12/2012   Procedure: LAPAROSCOPIC CHOLECYSTECTOMY;  Surgeon: Harl Bowie, MD;  Location: [facility];  Service: General;  Laterality: N/A;   Family History  Problem Relation Age of Onset  . Healthy Mother   . Ulcerative colitis Father   . Diabetes Maternal Grandmother   . Breast cancer Maternal Grandmother   . Syncope episode Maternal Grandfather   . Benign prostatic hyperplasia Maternal Grandfather   . Brain cancer Paternal Grandmother   . Lung cancer Paternal Grandfather   . Anesthesia problems Neg Hx   . Hypotension Neg Hx   . Malignant hyperthermia Neg Hx   . Pseudochol deficiency Neg Hx   . Colon cancer Neg Hx   . Esophageal cancer Neg Hx   . Rectal cancer Neg Hx    Social History   Tobacco Use  . Smoking status: Former Smoker    Types: Cigarettes    Quit date: 05/24/2011    Years since quitting: 7.7  . Smokeless tobacco: Never Used  Substance Use Topics  .  Alcohol use: Yes    Comment: Once a month on average  . Drug use: No   Current Outpatient Medications  Medication Sig Dispense Refill  . amLODipine (NORVASC) 5 MG tablet Take 1 tablet (5 mg total) by mouth daily. To lower blood sugar (Patient taking differently: Take 5 mg by mouth daily with supper. To lower blood pressure) 90 tablet 3  . Biotin 1 MG CAPS Take 1 mg by mouth daily with breakfast.     . hydrochlorothiazide (HYDRODIURIL) 25 MG tablet Take 1 tablet (25 mg total) by mouth daily. (Patient taking differently: Take 25 mg by mouth daily with supper. ) 90 tablet 3  . Insulin Glargine (LANTUS SOLOSTAR) 100 UNIT/ML Solostar Pen Inject 15 Units into the skin daily. 5 pen 3   . irbesartan (AVAPRO) 300 MG tablet Take 1 tablet (300 mg total) by mouth daily. 90 tablet 0  . metFORMIN (GLUCOPHAGE) 1000 MG tablet Take 1 tablet (1,000 mg total) by mouth 2 (two) times daily with a meal. (Patient taking differently: Take 200 mg by mouth daily with supper. ) 60 tablet 3  . Multiple Vitamins-Minerals (HAIR/SKIN/NAILS) TABS Take 1 tablet by mouth daily with breakfast.    . rivaroxaban (XARELTO) 20 MG TABS tablet Take 20 mg by mouth daily with supper.     No current facility-administered medications for this visit.   No Known Allergies   Review of Systems: All systems reviewed and negative except where noted in HPI.   Creatinine clearance cannot be calculated (Patient's most recent lab result is older than the maximum 21 days allowed.)   Physical Exam:    Wt Readings from Last 3 Encounters:  02/24/19 (!) 364 lb (165.1 kg)  02/03/19 (!) 369 lb (167.4 kg)  11/19/18 (!) 364 lb 1.6 oz (165.2 kg)    BP 130/70 Comment: large cuff  Pulse 85   Temp 98.4 F (36.9 C)   Ht '5\' 11"'  (1.803 m)   Wt (!) 364 lb (165.1 kg)   BMI 50.77 kg/m  Constitutional:  Pleasant female in no acute distress. Psychiatric: Normal mood and affect. Behavior is normal. EENT: Pupils normal.  Conjunctivae are normal. No scleral icterus. Neck supple.  Cardiovascular: Normal rate, regular rhythm. No edema Pulmonary/chest: Effort normal and breath sounds normal. No wheezing, rales or rhonchi. Abdominal: Soft, nondistended, nontender. Bowel sounds active throughout. There are no masses palpable. No hepatomegaly. Neurological: Alert and oriented to person place and time. Skin: Skin is warm and dry. No rashes noted.  Tye Savoy, NP  02/24/2019, 4:22 PM  Cc: Antony Blackbird, MD

## 2019-02-24 NOTE — Patient Instructions (Signed)
If you are age 35 or older, your body mass index should be between 23-30. Your Body mass index is 50.77 kg/m. If this is out of the aforementioned range listed, please consider follow up with your Primary Care Provider.  If you are age 54 or younger, your body mass index should be between 19-25. Your Body mass index is 50.77 kg/m. If this is out of the aformentioned range listed, please consider follow up with your Primary Care Provider.   Your provider has requested that you go to the basement level for lab work before leaving today. Press "B" on the elevator. The lab is located at the first door on the left as you exit the elevator.  You have been scheduled for an abdominal ultrasound at [facility] (1st floor of hospital) on 03/05/19 at 9 am. Please arrive 15 minutes prior to your appointment for registration. Make certain not to have anything to eat or drink after midnight the night prior to your appointment. Should you need to reschedule your appointment, please contact radiology at [phone]. This test typically takes about 30 minutes to perform.  We will call you with results.  Thank you for choosing me and [facility].   Tye Savoy, NP

## 2019-02-25 NOTE — Progress Notes (Signed)
____________________________________________________________  Attending physician addendum:  Thank you for sending this case to me. I have reviewed the entire note, and the outlined plan seems appropriate.  Laraina Sulton Danis, MD  ____________________________________________________________  

## 2019-02-28 LAB — ANTI-NUCLEAR AB-TITER (ANA TITER): ANA Titer 1: 1:320 {titer} — ABNORMAL HIGH

## 2019-02-28 LAB — HEPATITIS A ANTIBODY, TOTAL: Hepatitis A AB,Total: NONREACTIVE

## 2019-02-28 LAB — HEPATITIS B SURFACE ANTIBODY,QUALITATIVE: Hep B S Ab: REACTIVE — AB

## 2019-02-28 LAB — HEPATITIS C ANTIBODY
Hepatitis C Ab: NONREACTIVE
SIGNAL TO CUT-OFF: 0.01 (ref <1.00)

## 2019-02-28 LAB — ANTI-SMOOTH MUSCLE ANTIBODY, IGG: Actin (Smooth Muscle) Antibody (IGG): 24 U — ABNORMAL HIGH (ref <20)

## 2019-02-28 LAB — ALPHA-1-ANTITRYPSIN: A-1 Antitrypsin, Ser: 138 mg/dL (ref 83–199)

## 2019-02-28 LAB — HEPATITIS B SURFACE ANTIGEN: Hepatitis B Surface Ag: NONREACTIVE

## 2019-02-28 LAB — ANA: Anti Nuclear Antibody (ANA): POSITIVE — AB

## 2019-02-28 LAB — MITOCHONDRIAL ANTIBODIES: Mitochondrial M2 Ab, IgG: <=20 U

## 2019-02-28 LAB — CERULOPLASMIN: Ceruloplasmin: 40 mg/dL (ref 18–53)

## 2019-03-03 ENCOUNTER — Ambulatory Visit (INDEPENDENT_AMBULATORY_CARE_PROVIDER_SITE_OTHER): Payer: 59 | Admitting: Endocrinology

## 2019-03-03 ENCOUNTER — Encounter: Payer: Self-pay | Admitting: Endocrinology

## 2019-03-03 ENCOUNTER — Other Ambulatory Visit: Payer: Self-pay

## 2019-03-03 ENCOUNTER — Telehealth: Payer: Self-pay

## 2019-03-03 ENCOUNTER — Telehealth: Payer: Self-pay | Admitting: Nurse Practitioner

## 2019-03-03 VITALS — BP 144/92 | HR 103 | Ht 71.0 in | Wt 369.8 lb

## 2019-03-03 DIAGNOSIS — Z794 Long term (current) use of insulin: Secondary | ICD-10-CM

## 2019-03-03 DIAGNOSIS — E119 Type 2 diabetes mellitus without complications: Secondary | ICD-10-CM | POA: Diagnosis not present

## 2019-03-03 MED ORDER — ONETOUCH VERIO VI STRP: 1.0000 | ORAL_STRIP | Freq: Two times a day (BID) | 12 refills | Status: DC

## 2019-03-03 MED ORDER — TRULICITY 0.75 MG/0.5ML ~~LOC~~ SOAJ: 0.7500 mg | SUBCUTANEOUS | 11 refills | Status: DC

## 2019-03-03 NOTE — Telephone Encounter (Signed)
PRIOR AUTHORIZATION - REMAINS PENDING - PATIENT MUST SUPPLY ADDITIONAL INFORMATION  PA initiation date: 03/03/19   Insurance Company: [employer] Submission completed electronically through [employer] My Meds: Yes  There was an error with your request  ? Unable to locate member at this time. Please try again later. Sissi [place]: [place] and spoke with Patty. States plan is set to terminate 03/23/19. Further states plan pharmacy benefits are not managed by [employer]. Provided [employer] [phone]. After calling this #, was not able to complete PA d/t automated system continuously asked for fax #, provider's name and zip code. Was unable to proceed any further with this PA. This will remain on hold until pt can provide additional information re: her pharmacy benefits or an alternate # for which we can complete PA.

## 2019-03-03 NOTE — Progress Notes (Signed)
Subjective:    Patient ID: Whitney [name], female    DOB: Dec 26, 1985, 34 y.o.   MRN: [id number]  HPI pt is referred by Dr Chapman Fitch, for diabetes.  Pt states DM was dx'ed in 2017; she is unaware of any chronic complications; she has been on insulin since 1/21; pt says her diet and exercise are better recently; she has never had GDM, pancreatitis, pancreatic surgery, severe hypoglycemia or DKA.  She stopped Trulicity, due to cost.  She took Lantus 15/d, until she ran out.  She does not check cbg's.   Past Medical History:  Diagnosis Date  . Diabetes mellitus without complication ([place])   . DVT (deep venous thrombosis) ([place]) 03/2018  . Gallstones   . Headache(784.0)   . Hypertension     Past Surgical History:  Procedure Laterality Date  . CHOLECYSTECTOMY N/A 03/12/2012   Procedure: LAPAROSCOPIC CHOLECYSTECTOMY;  Surgeon: Harl Bowie, MD;  Location: [facility];  Service: General;  Laterality: N/A;    Social History   Socioeconomic History  . Marital status: Single    Spouse name: Not on file  . Number of children: 1  . Years of education: 34  . Highest education level: Not on file  Occupational History  . Not on file  Tobacco Use  . Smoking status: Former Smoker    Types: Cigarettes    Quit date: 05/24/2011    Years since quitting: 7.7  . Smokeless tobacco: Never Used  Substance and Sexual Activity  . Alcohol use: Yes    Comment: Once a month on average  . Drug use: No  . Sexual activity: Not Currently  Other Topics Concern  . Not on file  Social History Narrative   Fun: Read, hang out with her son.    Denies abuse and feels safe at home.    Social Determinants of Health   Financial Resource Strain:   . Difficulty of Paying Living Expenses: Not on file  Food Insecurity:   . Worried About Charity fundraiser in the Last Year: Not on file  . Ran Out of Food in the Last Year: Not on file  Transportation Needs:   . Lack of Transportation (Medical): Not on file  . Lack of  Transportation (Non-Medical): Not on file  Physical Activity:   . Days of Exercise per Week: Not on file  . Minutes of Exercise per Session: Not on file  Stress:   . Feeling of Stress : Not on file  Social Connections:   . Frequency of Communication with Friends and Family: Not on file  . Frequency of Social Gatherings with Friends and Family: Not on file  . Attends Religious Services: Not on file  . Active Member of Clubs or Organizations: Not on file  . Attends Archivist Meetings: Not on file  . Marital Status: Not on file  Intimate Partner Violence:   . Fear of Current or Ex-Partner: Not on file  . Emotionally Abused: Not on file  . Physically Abused: Not on file  . Sexually Abused: Not on file    Current Outpatient Medications on File Prior to Visit  Medication Sig Dispense Refill  . amLODipine (NORVASC) 5 MG tablet Take 1 tablet (5 mg total) by mouth daily. To lower blood sugar (Patient taking differently: Take 5 mg by mouth daily with supper. To lower blood pressure) 90 tablet 3  . Biotin 1 MG CAPS Take 1 mg by mouth daily with breakfast.     .  hydrochlorothiazide (HYDRODIURIL) 25 MG tablet Take 1 tablet (25 mg total) by mouth daily. (Patient taking differently: Take 25 mg by mouth daily with supper. ) 90 tablet 3  . irbesartan (AVAPRO) 300 MG tablet Take 1 tablet (300 mg total) by mouth daily. 90 tablet 0  . metFORMIN (GLUCOPHAGE) 1000 MG tablet Take 1 tablet (1,000 mg total) by mouth 2 (two) times daily with a meal. (Patient taking differently: Take 200 mg by mouth daily with supper. ) 60 tablet 3  . Multiple Vitamins-Minerals (HAIR/SKIN/NAILS) TABS Take 1 tablet by mouth daily with breakfast.    . rivaroxaban (XARELTO) 20 MG TABS tablet Take 20 mg by mouth daily with supper.    . Insulin Glargine (LANTUS SOLOSTAR) 100 UNIT/ML Solostar Pen Inject 15 Units into the skin daily. (Patient not taking: Reported on 03/03/2019) 5 pen 3   No current facility-administered  medications on file prior to visit.    No Known Allergies  Family History  Problem Relation Age of Onset  . Healthy Mother   . Ulcerative colitis Father   . Diabetes Maternal Grandmother   . Breast cancer Maternal Grandmother   . Syncope episode Maternal Grandfather   . Benign prostatic hyperplasia Maternal Grandfather   . Brain cancer Paternal Grandmother   . Lung cancer Paternal Grandfather   . Anesthesia problems Neg Hx   . Hypotension Neg Hx   . Malignant hyperthermia Neg Hx   . Pseudochol deficiency Neg Hx   . Colon cancer Neg Hx   . Esophageal cancer Neg Hx   . Rectal cancer Neg Hx     BP (!) 144/92 (BP Location: Right Wrist, Patient Position: Sitting, Cuff Size: Large)   Pulse (!) 103   Ht 5\' 11"  (1.803 m)   Wt (!) 369 lb 12.8 oz (167.7 kg)   SpO2 97%   BMI 51.58 kg/m    Review of Systems denies weight loss, blurry vision, chest pain, sob, n/v, urinary frequency, muscle cramps, excessive diaphoresis, and memory loss.  She has insomnia.      Objective:   Physical Exam VS: see vs page GEN: no distress.  morbid obesity HEAD: head: no deformity eyes: no periorbital swelling, no proptosis external nose and ears are normal NECK: supple, thyroid is not enlarged CHEST WALL: no deformity LUNGS: clear to auscultation CV: reg rate and rhythm, no murmur MUSCULOSKELETAL: muscle bulk and strength are grossly normal.  no obvious joint swelling.  gait is normal and steady.   EXTEMITIES: no deformity.  no ulcer on the feet.  feet are of normal color and temp.  Trace bilat leg edema.   PULSES: dorsalis pedis intact bilat.  no carotid bruit NEURO:  cn 2-12 grossly intact.   readily moves all 4's.  sensation is intact to touch on the feet SKIN:  Normal texture and temperature.  No rash or suspicious lesion is visible.   NODES:  None palpable at the neck.   PSYCH: alert, well-oriented.  Does not appear anxious nor depressed.    Lab Results  Component Value Date   HGBA1C  10.5 (H) 02/03/2019   Lab Results  Component Value Date   TSH 1.993 04/14/2018   Lab Results  Component Value Date   CREATININE 0.75 02/03/2019   BUN 7 02/03/2019   NA 137 02/03/2019   K 4.0 02/03/2019   CL 101 02/03/2019   CO2 24 02/03/2019   I have reviewed outside records, and summarized: Pt was noted to have elevated a1c, and referred  here. HTN, insomnia, and NASH were also addressed.       Assessment & Plan:  HTN: is noted today Insulin-requiring type 2 DM: we discussed.  I told pt she would not yet run out of her insulin a 15 units qd. Obesity, new to me.  Patient Instructions  Your blood pressure is high today.  Please see your primary care provider soon, to have it rechecked good diet and exercise significantly improve the control of your diabetes.  please let me know if you wish to be referred to a dietician.  high blood sugar is very risky to your health.  you should see an eye doctor and dentist every year.  It is very important to get all recommended vaccinations.  Controlling your blood pressure and cholesterol drastically reduces the damage diabetes does to your body.  Those who smoke should quit.  Please discuss these with your doctor.  check your blood sugar twice a day.  vary the time of day when you check, between before the 3 meals, and at bedtime.  also check if you have symptoms of your blood sugar being too high or too low.  please keep a record of the readings and bring it to your next appointment here (or you can bring the meter itself).  You can write it on any piece of paper.  please call us sooner if your blood sugar goes below 70, or if you have a lot of readings over 200.  Here is a new meter.  I have sent a prescription to your pharmacy, for strips.   At 15 units per day, 1 pen lasts approx 20 days.  Please let us know when you need a refill.   Please also resume the Trulicity at [id number] mg per week.  I have sent a prescription to your pharmacy.  Here is a  discount card.   Please come back for a follow-up appointment in 2-3 weeks.     Bariatric Surgery You have so much to gain by losing weight.  You may have already tried every diet and exercise plan imaginable.  And, you may have sought advice from your family physician, too.   Sometimes, in spite of such diligent efforts, you may not be able to achieve long-term results by yourself.  In cases of severe obesity, bariatric or weight loss surgery is a proven method of achieving long-term weight control.  Our Services Our bariatric surgery programs offer our patients new hope and long-term weight-loss solution.  Since introducing our services in 2003, we have conducted more than 2,400 successful procedures.  Our program is designated as a Programmer, multimedia by the Metabolic and Bariatric Surgery Accreditation and Quality Improvement Program (MBSAQIP), a IT trainer that sets rigorous patient safety and outcome standards.  Our program is also designated as a Ecologist by [employer].   Our exceptional weight-loss surgery team specializes in diagnosis, treatment, follow-up care, and ongoing support for our patients with severe weight loss challenges.  We currently offer laparoscopic sleeve gastrectomy, gastric bypass, and adjustable gastric band (LAP-BAND).    Attend our [place] Choosing to undergo a bariatric procedure is a big decision, and one that should not be taken lightly.  You now have two options in how you learn about weight-loss surgery - in person or online.  Our objective is to ensure you have all of the information that you need to evaluate the advantages and obligations of this life changing procedure.  Please  note that you are not alone in this process, and our experienced team is ready to assist and answer all of your questions.  There are several ways to register for a seminar (either on-line or in person): 1)  Call [phone] 2) Go  on-line to [facility] and register for either type of seminar.  [email or web address]

## 2019-03-03 NOTE — Patient Instructions (Addendum)
Your blood pressure is high today.  Please see your primary care provider soon, to have it rechecked good diet and exercise significantly improve the control of your diabetes.  please let me know if you wish to be referred to a dietician.  high blood sugar is very risky to your health.  you should see an eye doctor and dentist every year.  It is very important to get all recommended vaccinations.  Controlling your blood pressure and cholesterol drastically reduces the damage diabetes does to your body.  Those who smoke should quit.  Please discuss these with your doctor.  check your blood sugar twice a day.  vary the time of day when you check, between before the 3 meals, and at bedtime.  also check if you have symptoms of your blood sugar being too high or too low.  please keep a record of the readings and bring it to your next appointment here (or you can bring the meter itself).  You can write it on any piece of paper.  please call us sooner if your blood sugar goes below 70, or if you have a lot of readings over 200.  Here is a new meter.  I have sent a prescription to your pharmacy, for strips.   At 15 units per day, 1 pen lasts approx 20 days.  Please let us know when you need a refill.   Please also resume the Trulicity at [id number] mg per week.  I have sent a prescription to your pharmacy.  Here is a discount card.   Please come back for a follow-up appointment in 2-3 weeks.     Bariatric Surgery You have so much to gain by losing weight.  You may have already tried every diet and exercise plan imaginable.  And, you may have sought advice from your family physician, too.   Sometimes, in spite of such diligent efforts, you may not be able to achieve long-term results by yourself.  In cases of severe obesity, bariatric or weight loss surgery is a proven method of achieving long-term weight control.  Our Services Our bariatric surgery programs offer our patients new hope and long-term weight-loss  solution.  Since introducing our services in 2003, we have conducted more than 2,400 successful procedures.  Our program is designated as a Programmer, multimedia by the Metabolic and Bariatric Surgery Accreditation and Quality Improvement Program (MBSAQIP), a IT trainer that sets rigorous patient safety and outcome standards.  Our program is also designated as a Ecologist by [employer].   Our exceptional weight-loss surgery team specializes in diagnosis, treatment, follow-up care, and ongoing support for our patients with severe weight loss challenges.  We currently offer laparoscopic sleeve gastrectomy, gastric bypass, and adjustable gastric band (LAP-BAND).    Attend our [place] Choosing to undergo a bariatric procedure is a big decision, and one that should not be taken lightly.  You now have two options in how you learn about weight-loss surgery - in person or online.  Our objective is to ensure you have all of the information that you need to evaluate the advantages and obligations of this life changing procedure.  Please note that you are not alone in this process, and our experienced team is ready to assist and answer all of your questions.  There are several ways to register for a seminar (either on-line or in person): 1)  Call [phone] 2) Go on-line to [facility] and register for either type  of seminar.  [email or web address]

## 2019-03-04 NOTE — Telephone Encounter (Signed)
Whitney [name] if you would route her labs once you have reviewed them. Thank you.

## 2019-03-05 ENCOUNTER — Ambulatory Visit (HOSPITAL_COMMUNITY)
Admission: RE | Admit: 2019-03-05 | Discharge: 2019-03-05 | Disposition: A | Discharge status: Home / Self Care | Payer: 59 | Source: Ambulatory Visit | Attending: Nurse Practitioner | Admitting: Nurse Practitioner

## 2019-03-05 ENCOUNTER — Other Ambulatory Visit: Payer: Self-pay

## 2019-03-05 DIAGNOSIS — R7989 Other specified abnormal findings of blood chemistry: Secondary | ICD-10-CM

## 2019-03-05 DIAGNOSIS — R945 Abnormal results of liver function studies: Secondary | ICD-10-CM | POA: Diagnosis not present

## 2019-03-14 ENCOUNTER — Ambulatory Visit: Payer: 59 | Admitting: Pharmacist

## 2019-03-17 ENCOUNTER — Other Ambulatory Visit: Payer: Self-pay

## 2019-03-17 DIAGNOSIS — R7989 Other specified abnormal findings of blood chemistry: Secondary | ICD-10-CM

## 2019-03-17 DIAGNOSIS — R945 Abnormal results of liver function studies: Secondary | ICD-10-CM

## 2019-03-17 NOTE — Telephone Encounter (Signed)
Whitney Stabler, MD  03/07/2019 10:23 PM EST    IgG will be sufficient. Despite autoimmune markers, this still seems more likely to be fatty liver given overall clinical picture and mild transaminitis.  Given elevated risks of liver biopsy due to BMI and chronic anticoagulation, and the fact that LFTs only mildly elevated (below usual threshold for AIH treatment) I recommend consultation with [place] [facility] to see if they think a biopsy is necessary    Called the patient. No answer. Left a message asking she return my call.  Return for lab as ordered. Referral to [facility]). Records faxed.

## 2019-03-17 NOTE — Telephone Encounter (Signed)
Patient is aware of the recommendations. She agrees to this plan and will come this week for the labs. She understands to expect a call to schedule an appointment with the hepatologist for consultation.

## 2019-03-18 ENCOUNTER — Other Ambulatory Visit: Payer: 59

## 2019-03-18 DIAGNOSIS — R945 Abnormal results of liver function studies: Secondary | ICD-10-CM

## 2019-03-18 DIAGNOSIS — R7989 Other specified abnormal findings of blood chemistry: Secondary | ICD-10-CM

## 2019-03-19 ENCOUNTER — Ambulatory Visit: Payer: 59 | Attending: Family Medicine | Admitting: Pharmacist

## 2019-03-19 ENCOUNTER — Other Ambulatory Visit: Payer: Self-pay

## 2019-03-19 VITALS — BP 169/113 | HR 90

## 2019-03-19 DIAGNOSIS — E1165 Type 2 diabetes mellitus with hyperglycemia: Secondary | ICD-10-CM | POA: Diagnosis not present

## 2019-03-19 DIAGNOSIS — I10 Essential (primary) hypertension: Secondary | ICD-10-CM

## 2019-03-19 DIAGNOSIS — Z0131 Encounter for examination of blood pressure with abnormal findings: Secondary | ICD-10-CM | POA: Diagnosis not present

## 2019-03-19 LAB — IGG: IgG (Immunoglobin G), Serum: 1656 mg/dL — ABNORMAL HIGH (ref 600–1640)

## 2019-03-19 NOTE — Progress Notes (Signed)
    S:    PCP: Dr. Juleen China  No chief complaint on file.  Patient arrives in good spirits.  Presents for diabetes evaluation, education, and management. Patient was referred on 02/03/2019 by Dr. Chapman Fitch.  Family/Social History:  - FHx: DM - Tobacco: former smoker (quit in 2013) - Alcohol: occasionally   Insurance coverage/medication affordability: [place]  Patient reports adherence with medications.  Current diabetes medications include: Lantus 15 units daily, metformin 123XX123 mg BID, Trulicity [id number] mg weekly (has not started) Current hypertension medications include: amlodipine 5 mg, HCTZ 25 mg daily, irbesartan 150 mg daily  Current hyperlipidemia medications include: none   Patient denies hypoglycemic events.  Patient reported dietary habits:  - Pt admits to dietary indiscretion - Drinks diet or zero calorie sweetened beverages  - She drinks green smoothies; uses bananas and frozen berries  Patient-reported exercise habits:  - Patient recently started step classes 2x/week   Patient denies polyuria, polydipsia.  Patient denies neuropathy (nerve pain). Patient denies visual changes. Patient reports self foot exams.    O:  Lab Results  Component Value Date   HGBA1C 10.5 (H) 02/03/2019   Vitals:   03/19/19 1707  BP: (!) 169/113  Pulse: 90    Lipid Panel     Component Value Date/Time   CHOL 137 05/22/2018 0920   TRIG 112 05/22/2018 0920   HDL 42 05/22/2018 0920   CHOLHDL 3.3 05/22/2018 0920   CHOLHDL 3 12/27/2015 1014   VLDL 13.2 12/27/2015 1014   LDLCALC 73 05/22/2018 0920   Home fasting blood sugars: not checking  2 hour post-meal/random blood sugars: not checking.  Reported home BP:  - SBPs: 140-150  Clinical Atherosclerotic Cardiovascular Disease (ASCVD): No  The ASCVD Risk score Mikey Bussing DC Jr., et al., 2013) failed to calculate for the following reasons:   The 2013 ASCVD risk score is only valid for ages 9 to 4   A/P: Hypertension  longstanding currently uncontrolled. Home BP above goal.  Blood pressure goal = <130/80 mmHg. Patient is adherent to medications. Will defer further management to patient's PCP.  -Continue current medications.   Written patient instructions provided. Total time in face to face counseling 30 minutes.   Follow up Pharmacist Clinic Visit in 1 week.    Benard Halsted, PharmD, [place] [phone]

## 2019-03-20 ENCOUNTER — Telehealth: Payer: Self-pay

## 2019-03-20 NOTE — Telephone Encounter (Signed)
Called patient to do their pre-visit COVID screening.  Call went to voicemail. Unable to do prescreening.  

## 2019-03-21 ENCOUNTER — Encounter: Payer: Self-pay | Admitting: Internal Medicine

## 2019-03-21 ENCOUNTER — Other Ambulatory Visit: Payer: Self-pay

## 2019-03-21 ENCOUNTER — Ambulatory Visit (INDEPENDENT_AMBULATORY_CARE_PROVIDER_SITE_OTHER): Payer: 59 | Admitting: Internal Medicine

## 2019-03-21 VITALS — BP 166/113 | HR 97 | Temp 97.3°F | Resp 18 | Wt 363.0 lb

## 2019-03-21 DIAGNOSIS — I10 Essential (primary) hypertension: Secondary | ICD-10-CM | POA: Diagnosis not present

## 2019-03-21 DIAGNOSIS — F5101 Primary insomnia: Secondary | ICD-10-CM | POA: Diagnosis not present

## 2019-03-21 MED ORDER — AMLODIPINE BESYLATE 10 MG PO TABS: 10.0000 mg | ORAL_TABLET | Freq: Every day | ORAL | 1 refills | Status: DC

## 2019-03-21 NOTE — Progress Notes (Signed)
  Subjective:    Whitney [name] - 34 y.o. female MRN [id number]  Date of birth: [birth year]  HPI  Whitney [name] is here for follow up.  Chronic HTN Disease Monitoring:  Home BP Monitoring - Yes. 158/106 this AM. Typically [id number] systolic.  Chest pain- no  Dyspnea- no Headache - yes, migraines but consistent with history   Medications: HCTZ 25 mg daily, Irbesartan 300 mg daily, Amlodipine 5 mg daily  Compliance- yes (although missed medications yesterday)Lightheadedness- no  Edema- no   Insomnia: Has been occurring for almost a year. Not sleeping at nighttime. Will stay up all night and go to bed around 6-9am or just not sleeping at all. If she does fall asleep in the morning, she will sleep maybe 5 hours. Initially thought her insomnia was due to medications. She did a sleep study and was diagnosed with mild OSA. Once she gets to sleep, she can sleep but has issues falling asleep. Tried Trazodone in the past and it did not work. Only ever took one tablet of the medication.     Health Maintenance Due  Topic Date Due  . FOOT EXAM  01/27/2017    -  reports that she quit smoking about 7 years ago. Her smoking use included cigarettes. She has never used smokeless tobacco. - Review of Systems: Per HPI. - Past Medical History: Patient Active Problem List   Diagnosis Date Noted  . Acute deep vein thrombosis (DVT) of popliteal vein of left lower extremity ([place])   . Essential hypertension   . Pulmonary emboli ([place]) 04/13/2018  . Type 2 diabetes mellitus ([place]) 01/17/2016  . Morbid obesity ([place]) 12/27/2015  . Gallstone pancreatitis 03/11/2012  . Abdominal pain, acute 03/11/2012  . Group B streptococcal infection in pregnancy 01/27/2012   - Medications: reviewed and updated   Objective:   Physical Exam BP (!) 166/113   Pulse 97   Temp (!) 97.3 F (36.3 C) (Temporal)   Resp 18   Wt (!) 363 lb (164.7 kg)   SpO2 96%   BMI 50.63 kg/m  Physical Exam  Constitutional:  She is oriented to person, place, and time and well-developed, well-nourished, and in no distress. No distress.  HENT:  Head: Normocephalic and atraumatic.  Eyes: Conjunctivae and EOM are normal.  Cardiovascular: Normal rate, regular rhythm and normal heart sounds.  No murmur heard. Pulmonary/Chest: Effort normal and breath sounds normal. No respiratory distress.  Musculoskeletal:        General: Normal range of motion.  Neurological: She is alert and oriented to person, place, and time.  Skin: Skin is warm and dry. She is not diaphoretic.  Psychiatric: Affect and judgment normal.        Assessment & Plan:   1. Essential hypertension BP not at goal. Increase Amlodipine to 10 mg. Continue to monitor at home. Return in 2 months.  Counseled on blood pressure goal of less than 130/80, low-sodium, DASH diet, medication compliance, 150 minutes of moderate intensity exercise per week. Discussed medication compliance, adverse effects. - amLODipine (NORVASC) 10 MG tablet; Take 1 tablet (10 mg total) by mouth daily. To lower blood sugar  Dispense: 30 tablet; Refill: 1  2. Primary insomnia Advised to try Trazodone at larger dose of 100 mg. Take 1-1.5 hours prior to desired bedtime. Discussed sleep hygiene tactics as well.      Phill Myron, D.O. 03/21/2019, 10:59 AM Primary Care at [facility]

## 2019-03-21 NOTE — Patient Instructions (Addendum)
Increase your Amlodipine to 10 mg. Take two tablets of the 5 mg until you run out and then pick up your new Rx.   Try Trazodone 100 mg (two tablets) for sleep.   What can I do to improve my insomnia?--You can follow good "sleep hygiene." That means that you: ?Sleep only long enough to feel rested and then get out of bed ?Go to bed and get up at the same time every day ?Do not try to force yourself to sleep. If you can't sleep, get out of bed and try again later. ?Have coffee, tea, and other foods that have caffeine only in the morning ?Avoid alcohol in the late afternoon, evening, and bedtime ?Avoid smoking, especially in the evening ?Keep your bedroom dark, cool, quiet, and free of reminders of work or other things that cause you stress ?Solve problems you have before you go to bed ?Exercise several days a week, but not right before bed ?Avoid looking at phones or reading devices ("e-books") that give off light before bed. This can make it harder to fall asleep. Other things that can improve sleep include: ?Relaxation therapy, in which you focus on relaxing all the muscles in your body 1 by 1 ?Working with a Engineer, materials to deal with the problems that might be causing poor sleep

## 2019-03-24 ENCOUNTER — Other Ambulatory Visit: Payer: Self-pay

## 2019-03-24 ENCOUNTER — Ambulatory Visit: Payer: 59 | Admitting: Endocrinology

## 2019-03-26 ENCOUNTER — Ambulatory Visit (INDEPENDENT_AMBULATORY_CARE_PROVIDER_SITE_OTHER): Payer: 59 | Admitting: Endocrinology

## 2019-03-26 ENCOUNTER — Encounter: Payer: Self-pay | Admitting: Endocrinology

## 2019-03-26 ENCOUNTER — Other Ambulatory Visit: Payer: Self-pay

## 2019-03-26 VITALS — BP 124/76 | HR 105 | Ht 71.0 in | Wt 372.0 lb

## 2019-03-26 DIAGNOSIS — Z794 Long term (current) use of insulin: Secondary | ICD-10-CM

## 2019-03-26 DIAGNOSIS — E119 Type 2 diabetes mellitus without complications: Secondary | ICD-10-CM

## 2019-03-26 LAB — POCT GLYCOSYLATED HEMOGLOBIN (HGB A1C): Hemoglobin A1C: 9.9 % — AB (ref 4.0–5.6)

## 2019-03-26 MED ORDER — METFORMIN HCL ER 500 MG PO TB24: 1000.0000 mg | ORAL_TABLET | Freq: Every day | ORAL | 3 refills | Status: DC

## 2019-03-26 MED ORDER — HYDROCHLOROTHIAZIDE 25 MG PO TABS: 12.5000 mg | ORAL_TABLET | Freq: Every day | ORAL | 3 refills | Status: DC

## 2019-03-26 MED ORDER — FARXIGA 10 MG PO TABS: 10.0000 mg | ORAL_TABLET | Freq: Every day | ORAL | 11 refills | Status: DC

## 2019-03-26 NOTE — Patient Instructions (Addendum)
check your blood sugar twice a day.  vary the time of day when you check, between before the 3 meals, and at bedtime.  also check if you have symptoms of your blood sugar being too high or too low.  please keep a record of the readings and bring it to your next appointment here (or you can bring the meter itself).  You can write it on any piece of paper.  please call us sooner if your blood sugar goes below 70, or if you have a lot of readings over 200.   foor now, please: Reduce the metformin to 2 pills per day, and: Reduce the HCTZ to 1/2 pill per day, and: I have sent a prescription to your pharmacy, to add "[place]," and: Please continue the same insulin, and: Hold off on the Trulicity until the nausea is better.  We'll address that further next time.  Please come back for a follow-up appointment in 6 weeks.

## 2019-03-26 NOTE — Progress Notes (Signed)
Subjective:    Patient ID: Whitney [name], female    DOB: 10/12/1985, 34 y.o.   MRN: [id number]  HPI Pt returns for f/u of diabetes mellitus: DM type: Insulin-requiring type 2 Dx'ed: 0000000 Complications: none Therapy: insulin since 1/21 GDM: never DKA: never Severe hypoglycemia: never Pancreatitis: never Pancreatic imaging: never SDOH: none Other: she takes QD insulin, at least for now Interval history: she brings a record of her cbg's which I have reviewed today.  cbg varies from 120-300, but most are in the mid-100's.   Past Medical History:  Diagnosis Date  . Diabetes mellitus without complication ([place])   . DVT (deep venous thrombosis) ([place]) 03/2018  . Gallstones   . Headache(784.0)   . Hypertension     Past Surgical History:  Procedure Laterality Date  . CHOLECYSTECTOMY N/A 03/12/2012   Procedure: LAPAROSCOPIC CHOLECYSTECTOMY;  Surgeon: Harl Bowie, MD;  Location: [facility];  Service: General;  Laterality: N/A;    Social History   Socioeconomic History  . Marital status: Single    Spouse name: Not on file  . Number of children: 1  . Years of education: 17  . Highest education level: Not on file  Occupational History  . Not on file  Tobacco Use  . Smoking status: Former Smoker    Types: Cigarettes    Quit date: 05/24/2011    Years since quitting: 7.8  . Smokeless tobacco: Never Used  Substance and Sexual Activity  . Alcohol use: Yes    Comment: Once a month on average  . Drug use: No  . Sexual activity: Not Currently  Other Topics Concern  . Not on file  Social History Narrative   Fun: Read, hang out with her son.    Denies abuse and feels safe at home.    Social Determinants of Health   Financial Resource Strain:   . Difficulty of Paying Living Expenses: Not on file  Food Insecurity:   . Worried About Charity fundraiser in the Last Year: Not on file  . Ran Out of Food in the Last Year: Not on file  Transportation Needs:   . Lack of Transportation  (Medical): Not on file  . Lack of Transportation (Non-Medical): Not on file  Physical Activity:   . Days of Exercise per Week: Not on file  . Minutes of Exercise per Session: Not on file  Stress:   . Feeling of Stress : Not on file  Social Connections:   . Frequency of Communication with Friends and Family: Not on file  . Frequency of Social Gatherings with Friends and Family: Not on file  . Attends Religious Services: Not on file  . Active Member of Clubs or Organizations: Not on file  . Attends Archivist Meetings: Not on file  . Marital Status: Not on file  Intimate Partner Violence:   . Fear of Current or Ex-Partner: Not on file  . Emotionally Abused: Not on file  . Physically Abused: Not on file  . Sexually Abused: Not on file    Current Outpatient Medications on File Prior to Visit  Medication Sig Dispense Refill  . amLODipine (NORVASC) 10 MG tablet Take 1 tablet (10 mg total) by mouth daily. To lower blood sugar 30 tablet 1  . Biotin 1 MG CAPS Take 1 mg by mouth daily with breakfast.     . glucose blood (ONETOUCH VERIO) test strip 1 each by Other route 2 (two) times daily. And lancets 2/day  100 each 12  . Insulin Glargine (LANTUS SOLOSTAR) 100 UNIT/ML Solostar Pen Inject 15 Units into the skin daily. 5 pen 3  . irbesartan (AVAPRO) 300 MG tablet Take 1 tablet (300 mg total) by mouth daily. 90 tablet 0  . Lancets (ONETOUCH DELICA PLUS 123XX123) MISC USE 1 TO CHECK GLUCOSE TWICE DAILY    . Multiple Vitamins-Minerals (HAIR/SKIN/NAILS) TABS Take 1 tablet by mouth daily with breakfast.    . rivaroxaban (XARELTO) 20 MG TABS tablet Take 20 mg by mouth daily with supper.     No current facility-administered medications on file prior to visit.    No Known Allergies  Family History  Problem Relation Age of Onset  . Healthy Mother   . Ulcerative colitis Father   . Diabetes Maternal Grandmother   . Breast cancer Maternal Grandmother   . Syncope episode Maternal  Grandfather   . Benign prostatic hyperplasia Maternal Grandfather   . Brain cancer Paternal Grandmother   . Lung cancer Paternal Grandfather   . Anesthesia problems Neg Hx   . Hypotension Neg Hx   . Malignant hyperthermia Neg Hx   . Pseudochol deficiency Neg Hx   . Colon cancer Neg Hx   . Esophageal cancer Neg Hx   . Rectal cancer Neg Hx     BP 124/76 (BP Location: Right Wrist, Patient Position: Sitting, Cuff Size: Large)   Pulse (!) 105   Ht 5\' 11"  (1.803 m)   Wt (!) 372 lb (168.7 kg)   SpO2 97%   BMI 51.88 kg/m   Review of Systems She denies hypoglycemia.  She has slight nausea.      Objective:   Physical Exam VITAL SIGNS:  See vs page GENERAL: no distress Pulses: dorsalis pedis intact bilat.   MSK: no deformity of the feet CV: no leg edema Skin:  no ulcer on the feet.  normal color and temp on the feet.  Neuro: sensation is intact to touch on the feet   Lab Results  Component Value Date   CREATININE 0.75 02/03/2019   BUN 7 02/03/2019   NA 137 02/03/2019   K 4.0 02/03/2019   CL 101 02/03/2019   CO2 24 02/03/2019    Lab Results  Component Value Date   HGBA1C 9.9 (A) 03/26/2019       Assessment & Plan:  Insulin-requiring type 2 DM: she needs increased rx Nausea: This limits rx dosages HTN: we need to reduce the HCTZ, due to the [place]  Patient Instructions  check your blood sugar twice a day.  vary the time of day when you check, between before the 3 meals, and at bedtime.  also check if you have symptoms of your blood sugar being too high or too low.  please keep a record of the readings and bring it to your next appointment here (or you can bring the meter itself).  You can write it on any piece of paper.  please call us sooner if your blood sugar goes below 70, or if you have a lot of readings over 200.   foor now, please: Reduce the metformin to 2 pills per day, and: Reduce the HCTZ to 1/2 pill per day, and: I have sent a prescription to your  pharmacy, to add "[place]," and: Please continue the same insulin, and: Hold off on the Trulicity until the nausea is better.  We'll address that further next time.  Please come back for a follow-up appointment in 6 weeks.

## 2019-03-28 ENCOUNTER — Telehealth: Payer: Self-pay | Admitting: Nurse Practitioner

## 2019-03-28 NOTE — Telephone Encounter (Signed)
Confirmed the referral has been received by [facility]. It has and they anticipate reaching out to the patient next week. I have left this information on the patient's voicemail and thanked her for letting us know. Encouraged her to keep [place] informed.

## 2019-04-18 ENCOUNTER — Telehealth: Payer: Self-pay | Admitting: Internal Medicine

## 2019-04-18 NOTE — Telephone Encounter (Signed)
Patient is wanting to get the COVID vaccine but is worried due to underlying conditions.   Please reach out to patient regarding her concerns.

## 2019-04-18 NOTE — Telephone Encounter (Signed)
Returned patient's call. She has the opportunity to take the COVID vaccine. Discussed that given her medical conditions, would strongly suggest she be vaccinated due to potential of worsened disease course with her co-morbidities if she were to contract the virus.   She had some other concerns including fatigue and irregular menstrual cycles. Suggested she make an appointment so that we can discuss further with adequate time.   Phill Myron, D.O. Primary Care at [facility]  04/18/2019, 10:03 AM

## 2019-05-06 ENCOUNTER — Ambulatory Visit: Payer: 59 | Admitting: Endocrinology

## 2019-05-16 ENCOUNTER — Telehealth: Payer: Self-pay

## 2019-05-16 NOTE — Telephone Encounter (Signed)
Called patient to do their pre-visit COVID screening.   Patient states that she is having issues with her insurance. Wants to reschedule appointment. Appointment moved to 06/09/2019 @ 10:10 am.

## 2019-05-19 ENCOUNTER — Ambulatory Visit: Payer: 59 | Admitting: Internal Medicine

## 2019-06-06 ENCOUNTER — Telehealth: Payer: Self-pay

## 2019-06-06 NOTE — Telephone Encounter (Signed)

## 2019-06-09 ENCOUNTER — Ambulatory Visit (INDEPENDENT_AMBULATORY_CARE_PROVIDER_SITE_OTHER): Payer: Self-pay | Admitting: Internal Medicine

## 2019-06-09 ENCOUNTER — Other Ambulatory Visit: Payer: Self-pay

## 2019-06-09 ENCOUNTER — Encounter: Payer: Self-pay | Admitting: Internal Medicine

## 2019-06-09 VITALS — BP 167/125 | HR 83 | Temp 97.2°F | Resp 17 | Ht 71.0 in | Wt 365.0 lb

## 2019-06-09 DIAGNOSIS — I10 Essential (primary) hypertension: Secondary | ICD-10-CM

## 2019-06-09 DIAGNOSIS — F5101 Primary insomnia: Secondary | ICD-10-CM

## 2019-06-09 MED ORDER — CARVEDILOL 6.25 MG PO TABS: 6.2500 mg | ORAL_TABLET | Freq: Two times a day (BID) | ORAL | 3 refills | Status: DC

## 2019-06-09 NOTE — Patient Instructions (Addendum)
What can I do to improve my insomnia?--You can follow good "sleep hygiene." That means that you: ?Sleep only long enough to feel rested and then get out of bed ?Go to bed and get up at the same time every day ?Do not try to force yourself to sleep. If you can't sleep, get out of bed and try again later. ?Have coffee, tea, and other foods that have caffeine only in the morning ?Avoid alcohol in the late afternoon, evening, and bedtime ?Avoid smoking, especially in the evening ?Keep your bedroom dark, cool, quiet, and free of reminders of work or other things that cause you stress ?Solve problems you have before you go to bed ?Exercise several days a week, but not right before bed ?Avoid looking at phones or reading devices ("e-books") that give off light before bed. This can make it harder to fall asleep. Other things that can improve sleep include: ?Relaxation therapy, in which you focus on relaxing all the muscles in your body 1 by 1 ?Working with a Engineer, materials to deal with the problems that might be causing poor sleep  Sleep hygiene guidelines Recommendation Details  Regular bedtime and rise time Having a consistent bedtime and rise time leads to more regular sleep schedules and avoids periods of sleep deprivation or periods of extended wakefulness during the night.  Avoid napping Avoid napping, especially naps lasting longer than 1 hour and naps late in the day.  Limit caffeine Avoid caffeine after lunch. The time between lunch and bedtimerepresents approximately 2 half-lives for caffeine, and this time window allows for most caffeine to be metabolized before bedtime.  Limit alcohol Recommendations are typically focused on avoiding alcohol near bedtime. Alcohol is initially sedating, but activating as it is metabolized. Alcohol also negatively impacts sleep architecture.  Avoid nicotine Nicotine is a stimulant and should be avoided near bedtime and at night.  Exercise  Daytime physical activity is encouraged, in particular, 4 to 6 hours before bedtime, as this may facilitate sleep onset. Rigorous exercise within 2 hours of bedtime is discouraged.  Keep the sleep environment quiet and dark Noise and light exposure during the night can disrupt sleep. White noise or ear plugs are often recommended to reduce noise. Using blackout shades or an eye mask is commonly recommended to reduce light. This may also include avoiding exposure to television or technology near bedtime, as this can have an impact on circadian rhythms by shifting sleep timing later.   Bedroom clock Avoid checking the time at night. This includes alarm clocks and other time pieces (eg, watches and smart phones). Checking the time increases cognitive arousal and prolongs wakefulness.  Evening eating Avoid a large meal near bedtime, but don't go to bed hungry. Eat a healthy and filling meal in the evening and avoid late-night snacks.

## 2019-06-09 NOTE — Progress Notes (Signed)
  Subjective:    Whitney [name] - 34 y.o. female MRN [id number]  Date of birth: 1986-01-13  HPI  Whitney [name] is here for follow up.  Chronic HTN Increased Amlodipine from 5 to 10 mg at last visit.  Disease Monitoring:  Home BP Monitoring - 140-150/80-90  Chest pain- no  Dyspnea- no Headache - no  Medications: HCTZ 25 mg, Irbesartan 300 mg daily, Amlodipine 10 mg  Compliance- yes, bu did not take this morningLightheadedness- no  Edema- no    Insomnia: At prior visit, recommend trying Trazodone 100 mg nightly prn for insomnia. She has actually been able to discontinue medication. Says she's not sure what happened but all of the sudden her sleep greatly improved.     Health Maintenance:  Health Maintenance Due  Topic Date Due  . COVID-19 Vaccine (1) Never done  . FOOT EXAM  01/27/2017    -  reports that she quit smoking about 8 years ago. Her smoking use included cigarettes. She has never used smokeless tobacco. - Review of Systems: Per HPI. - Past Medical History: Patient Active Problem List   Diagnosis Date Noted  . Acute deep vein thrombosis (DVT) of popliteal vein of left lower extremity ([place])   . Essential hypertension   . Pulmonary emboli ([place]) 04/13/2018  . Type 2 diabetes mellitus ([place]) 01/17/2016  . Morbid obesity ([place]) 12/27/2015   - Medications: reviewed and updated   Objective:   Physical Exam BP (!) 167/125   Pulse 83   Temp (!) 97.2 F (36.2 C) (Temporal)   Resp 17   Ht 5\' 11"  (1.803 m)   Wt (!) 365 lb (165.6 kg)   LMP 05/24/2019 (Within Days)   SpO2 95%   BMI 50.91 kg/m  Physical Exam  Constitutional: She is oriented to person, place, and time and well-developed, well-nourished, and in no distress. No distress.  HENT:  Head: Normocephalic and atraumatic.  Eyes: Conjunctivae and EOM are normal.  Cardiovascular: Normal rate, regular rhythm and normal heart sounds.  No murmur heard. Pulmonary/Chest: Effort normal and breath  sounds normal. No respiratory distress.  Musculoskeletal:        General: Normal range of motion.  Neurological: She is alert and oriented to person, place, and time.  Skin: Skin is warm and dry. She is not diaphoretic.  Psychiatric: Affect and judgment normal.           Assessment & Plan:   1. Essential hypertension Still above goal. Will add Coreg. Discussed potential side effects and appropriate way to take medication with patient. Continue other medications. If BP still uncontrolled with addition of 4th agent, would warrant further work up. Certainly, obesity is likely worsening the situation. Could consider work up for undiagnosed OSA although her sleep symptoms have all resolved. Return in one month.  Counseled on blood pressure goal of less than 130/80, low-sodium, DASH diet, medication compliance, 150 minutes of moderate intensity exercise per week. Discussed medication compliance, adverse effects. - carvedilol (COREG) 6.25 MG tablet; Take 1 tablet (6.25 mg total) by mouth 2 (two) times daily with a meal.  Dispense: 60 tablet; Refill: 3  2. Primary insomnia Resolved. Sleep hygiene emphasized to try to avoid future periods of insomnia.      Phill Myron, D.O. 06/09/2019, 10:26 AM Primary Care at [facility]

## 2019-07-11 ENCOUNTER — Telehealth: Payer: Self-pay

## 2019-07-11 NOTE — Telephone Encounter (Signed)
Called patient to do their pre-visit COVID screening.  Patient states that she needs to reschedule her appointment. Transferred to the front desk.

## 2019-07-14 ENCOUNTER — Ambulatory Visit: Payer: Self-pay | Admitting: Internal Medicine

## 2019-08-06 ENCOUNTER — Telehealth: Payer: Self-pay

## 2019-08-06 NOTE — Telephone Encounter (Signed)
Called patient to do their pre-visit COVID screening.  Call went to voicemail. Unable to do prescreening.  

## 2019-08-07 ENCOUNTER — Ambulatory Visit: Payer: Self-pay | Admitting: Internal Medicine

## 2019-11-24 DIAGNOSIS — N920 Excessive and frequent menstruation with regular cycle: Secondary | ICD-10-CM | POA: Insufficient documentation

## 2019-11-24 DIAGNOSIS — I10 Essential (primary) hypertension: Secondary | ICD-10-CM

## 2019-12-25 ENCOUNTER — Other Ambulatory Visit: Payer: Self-pay

## 2020-01-02 DIAGNOSIS — R768 Other specified abnormal immunological findings in serum: Secondary | ICD-10-CM | POA: Insufficient documentation

## 2020-01-02 DIAGNOSIS — K760 Fatty (change of) liver, not elsewhere classified: Secondary | ICD-10-CM | POA: Insufficient documentation

## 2020-01-02 DIAGNOSIS — G4733 Obstructive sleep apnea (adult) (pediatric): Secondary | ICD-10-CM | POA: Insufficient documentation

## 2020-01-23 ENCOUNTER — Other Ambulatory Visit: Payer: Self-pay

## 2020-01-23 ENCOUNTER — Ambulatory Visit
Admission: EM | Admit: 2020-01-23 | Discharge: 2020-01-23 | Disposition: A | Discharge status: Home / Self Care | Payer: BC Managed Care – PPO

## 2020-02-16 ENCOUNTER — Other Ambulatory Visit: Payer: Self-pay

## 2020-02-16 ENCOUNTER — Encounter (HOSPITAL_COMMUNITY): Payer: Self-pay | Admitting: Emergency Medicine

## 2020-02-16 ENCOUNTER — Emergency Department (HOSPITAL_COMMUNITY)
Admission: EM | Admit: 2020-02-16 | Discharge: 2020-02-17 | Disposition: A | Discharge status: Home / Self Care | Payer: 59 | Attending: Emergency Medicine | Admitting: Emergency Medicine

## 2020-02-16 DIAGNOSIS — Z79899 Other long term (current) drug therapy: Secondary | ICD-10-CM | POA: Diagnosis not present

## 2020-02-16 DIAGNOSIS — N83201 Unspecified ovarian cyst, right side: Secondary | ICD-10-CM

## 2020-02-16 DIAGNOSIS — Z7984 Long term (current) use of oral hypoglycemic drugs: Secondary | ICD-10-CM | POA: Insufficient documentation

## 2020-02-16 DIAGNOSIS — Z86718 Personal history of other venous thrombosis and embolism: Secondary | ICD-10-CM | POA: Insufficient documentation

## 2020-02-16 DIAGNOSIS — R1031 Right lower quadrant pain: Secondary | ICD-10-CM | POA: Diagnosis present

## 2020-02-16 DIAGNOSIS — Z7901 Long term (current) use of anticoagulants: Secondary | ICD-10-CM | POA: Diagnosis not present

## 2020-02-16 DIAGNOSIS — Z794 Long term (current) use of insulin: Secondary | ICD-10-CM | POA: Diagnosis not present

## 2020-02-16 DIAGNOSIS — N83209 Unspecified ovarian cyst, unspecified side: Secondary | ICD-10-CM | POA: Insufficient documentation

## 2020-02-16 DIAGNOSIS — I10 Essential (primary) hypertension: Secondary | ICD-10-CM | POA: Insufficient documentation

## 2020-02-16 DIAGNOSIS — E119 Type 2 diabetes mellitus without complications: Secondary | ICD-10-CM | POA: Insufficient documentation

## 2020-02-16 LAB — COMPREHENSIVE METABOLIC PANEL
ALT: 45 U/L — ABNORMAL HIGH (ref 0–44)
AST: 27 U/L (ref 15–41)
Albumin: 3.4 g/dL — ABNORMAL LOW (ref 3.5–5.0)
Alkaline Phosphatase: 57 U/L (ref 38–126)
Anion gap: 9 (ref 5–15)
BUN: 9 mg/dL (ref 6–20)
CO2: 23 mmol/L (ref 22–32)
Calcium: 9.6 mg/dL (ref 8.9–10.3)
Chloride: 103 mmol/L (ref 98–111)
Creatinine, Ser: 0.86 mg/dL (ref 0.44–1.00)
GFR, Estimated: >60 mL/min (ref >60)
Glucose, Bld: 227 mg/dL — ABNORMAL HIGH (ref 70–99)
Potassium: 3.4 mmol/L — ABNORMAL LOW (ref 3.5–5.1)
Sodium: 135 mmol/L (ref 135–145)
Total Bilirubin: 0.6 mg/dL (ref 0.3–1.2)
Total Protein: 7.2 g/dL (ref 6.5–8.1)

## 2020-02-16 LAB — LIPASE, BLOOD: Lipase: 30 U/L (ref 11–51)

## 2020-02-16 LAB — CBC
HCT: 42.8 % (ref 36.0–46.0)
Hemoglobin: 13.7 g/dL (ref 12.0–15.0)
MCH: 25.8 pg — ABNORMAL LOW (ref 26.0–34.0)
MCHC: 32 g/dL (ref 30.0–36.0)
MCV: 80.5 fL (ref 80.0–100.0)
Platelets: 316 10*3/uL (ref 150–400)
RBC: 5.32 MIL/uL — ABNORMAL HIGH (ref 3.87–5.11)
RDW: 13.7 % (ref 11.5–15.5)
WBC: 8.9 10*3/uL (ref 4.0–10.5)
nRBC: 0 % (ref 0.0–0.2)

## 2020-02-16 LAB — I-STAT BETA HCG BLOOD, ED (MC, WL, AP ONLY): I-stat hCG, quantitative: <5 m[IU]/mL (ref <5)

## 2020-02-16 NOTE — ED Triage Notes (Signed)
Acute onset of abdominal pain below the umbilicus.  She did not take her blood pressure meds today.  Patient is here with continued abdominal pain, no nausea or vomiting.  She is hypertensive at 260/153.  EKG NSR.

## 2020-02-17 ENCOUNTER — Emergency Department (HOSPITAL_COMMUNITY): Payer: 59

## 2020-02-17 MED ORDER — SODIUM CHLORIDE 0.9 % IV BOLUS
1000.0000 mL | Freq: Once | INTRAVENOUS | Status: AC
  Administered 2020-02-17: 1000 mL via INTRAVENOUS

## 2020-02-17 MED ORDER — KETOROLAC TROMETHAMINE 30 MG/ML IJ SOLN
30.0000 mg | Freq: Once | INTRAMUSCULAR | Status: AC
  Administered 2020-02-17: 30 mg via INTRAVENOUS
  Filled 2020-02-17: qty 1

## 2020-02-17 MED ORDER — CARVEDILOL 3.125 MG PO TABS
6.2500 mg | ORAL_TABLET | Freq: Once | ORAL | Status: AC
  Administered 2020-02-17: 6.25 mg via ORAL
  Filled 2020-02-17: qty 2

## 2020-02-17 MED ORDER — AMLODIPINE BESYLATE 5 MG PO TABS
10.0000 mg | ORAL_TABLET | Freq: Once | ORAL | Status: AC
  Administered 2020-02-17: 10 mg via ORAL
  Filled 2020-02-17: qty 2

## 2020-02-17 MED ORDER — IOHEXOL 300 MG/ML  SOLN
100.0000 mL | Freq: Once | INTRAMUSCULAR | Status: AC | PRN
  Administered 2020-02-17: 100 mL via INTRAVENOUS

## 2020-02-17 NOTE — Discharge Instructions (Signed)
You have been seen and discharged from the emergency department.  You were found to have a right ovarian cyst.  Follow-up with your primary provider and OB/GYN for reevaluation. Take Tylenol as needed for pain control. If you have any worsening symptoms, sudden right lower quadrant pain or further concerns for health please return to an emergency department for further evaluation.

## 2020-02-17 NOTE — ED Notes (Signed)
Patient transported to CT 

## 2020-02-17 NOTE — ED Provider Notes (Signed)
[facility] EMERGENCY DEPARTMENT Provider Note   CSN: [id number] Arrival date & time: 02/16/20  2141     History Chief Complaint  Patient presents with  . Abdominal Pain    Whitney [name] is a 35 y.o. female.  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  Patient has irregular menses, she is currently menstruating.  She states yesterday evening around dinnertime she had sudden onset sharp right lower quadrant/groin pain.  She states since this started its been persistent, today transition to more of an achy feeling than sharp.  The pain does not radiate.  She has had no diarrhea or other genitourinary symptoms.  She is never had pain like this before.  She denies any fever.  She has not taken any of her home medications including hypertensive meds prior to arrival.  Past Medical History:  Diagnosis Date  . Diabetes mellitus without complication ([place])   . DVT (deep venous thrombosis) ([place]) 03/2018  . Gallstones   . Headache(784.0)   . Hypertension     Patient Active Problem List   Diagnosis Date Noted  . Acute deep vein thrombosis (DVT) of popliteal vein of left lower extremity ([place])   . Essential hypertension   . Pulmonary emboli ([place]) 04/13/2018  . Type 2 diabetes mellitus ([place]) 01/17/2016  . Morbid obesity ([place]) 12/27/2015    Past Surgical History:  Procedure Laterality Date  . CHOLECYSTECTOMY N/A 03/12/2012   Procedure: LAPAROSCOPIC CHOLECYSTECTOMY;  Surgeon: Harl Bowie, MD;  Location: [place];  Service: General;  Laterality: N/A;     OB History    Gravida  1   Para  1   Term  1   Preterm  0   AB  0   Living  1     SAB  0   IAB  0   Ectopic  0   Multiple  0   Live Births  1           Family History  Problem Relation Age of Onset  . Healthy Mother   . Ulcerative  colitis Father   . Diabetes Maternal Grandmother   . Breast cancer Maternal Grandmother   . Syncope episode Maternal Grandfather   . Benign prostatic hyperplasia Maternal Grandfather   . Brain cancer Paternal Grandmother   . Lung cancer Paternal Grandfather   . Anesthesia problems Neg Hx   . Hypotension Neg Hx   . Malignant hyperthermia Neg Hx   . Pseudochol deficiency Neg Hx   . Colon cancer Neg Hx   . Esophageal cancer Neg Hx   . Rectal cancer Neg Hx     Social History   Tobacco Use  . Smoking status: Former Smoker    Types: Cigarettes    Quit date: 05/24/2011    Years since quitting: 8.7  . Smokeless tobacco: Never Used  Vaping Use  . Vaping Use: Never used  Substance Use Topics  . Alcohol use: Yes    Comment: Once a month on average  . Drug use: No    Home Medications Prior to Admission medications   Medication Sig Start Date End Date Taking? Authorizing Provider  amLODipine (NORVASC) 10 MG tablet Take 1 tablet (10 mg total) by mouth daily. To lower blood sugar 03/21/19   Nicolette Bang, DO  Biotin 1 MG CAPS Take 1 mg by  mouth daily with breakfast.     [provider]  carvedilol (COREG) 6.25 MG tablet Take 1 tablet (6.25 mg total) by mouth 2 (two) times daily with a meal. 06/09/19   Nicolette Bang, DO  dapagliflozin propanediol (FARXIGA) 10 MG TABS tablet Take 10 mg by mouth daily before breakfast. 03/26/19   Renato Shin, MD  glucose blood (ONETOUCH VERIO) test strip 1 each by Other route 2 (two) times daily. And lancets 2/day 03/03/19   Renato Shin, MD  hydrochlorothiazide (HYDRODIURIL) 25 MG tablet Take 0.5 tablets (12.5 mg total) by mouth daily. 03/26/19   Renato Shin, MD  Insulin Glargine (LANTUS SOLOSTAR) 100 UNIT/ML Solostar Pen Inject 15 Units into the skin daily. 02/03/19   Fulp, Cammie, MD  irbesartan (AVAPRO) 300 MG tablet Take 1 tablet (300 mg total) by mouth daily. 02/21/19   Fulp, Cammie, MD  Lancets (ONETOUCH DELICA PLUS  [id number]) MISC USE 1 TO CHECK GLUCOSE TWICE DAILY 03/04/19   [provider]  metFORMIN (GLUCOPHAGE-XR) 500 MG 24 hr tablet Take 2 tablets (1,000 mg total) by mouth daily with breakfast. 03/26/19   Renato Shin, MD  Multiple Vitamins-Minerals (HAIR/SKIN/NAILS) TABS Take 1 tablet by mouth daily with breakfast.    [provider]  rivaroxaban (XARELTO) 20 MG TABS tablet Take 20 mg by mouth daily with supper.    [provider]    Allergies    Patient has no known allergies.  Review of Systems   Review of Systems  Constitutional: Negative for chills and fever.  HENT: Negative for congestion.   Eyes: Negative for visual disturbance.  Respiratory: Negative for shortness of breath.   Cardiovascular: Negative for chest pain.  Gastrointestinal: Positive for abdominal pain. Negative for diarrhea and vomiting.  Genitourinary: Positive for vaginal bleeding and vaginal pain. Negative for dysuria and vaginal discharge.  Skin: Negative for rash.  Neurological: Negative for headaches.    Physical Exam Updated Vital Signs BP (!) 181/117 (BP Location: Right Arm)   Pulse (!) 101   Temp 98.7 F (37.1 C) (Oral)   Resp 16   SpO2 96%   Physical Exam Vitals and nursing note reviewed.  Constitutional:      Appearance: Normal appearance.  HENT:     Head: Normocephalic.     Mouth/Throat:     Mouth: Mucous membranes are moist.  Cardiovascular:     Rate and Rhythm: Normal rate.  Pulmonary:     Effort: Pulmonary effort is normal. No respiratory distress.  Abdominal:     Palpations: Abdomen is soft.     Tenderness: There is abdominal tenderness in the right lower quadrant.  Skin:    General: Skin is warm.  Neurological:     Mental Status: She is alert and oriented to person, place, and time. Mental status is at baseline.  Psychiatric:        Mood and Affect: Mood normal.     ED Results / Procedures / Treatments   Labs (all labs ordered are listed, but only abnormal  results are displayed) Labs Reviewed  COMPREHENSIVE METABOLIC PANEL - Abnormal; Notable for the following components:      Result Value   Potassium 3.4 (*)    Glucose, Bld 227 (*)    Albumin 3.4 (*)    ALT 45 (*)    All other components within normal limits  CBC - Abnormal; Notable for the following components:   RBC 5.32 (*)    MCH 25.8 (*)    All  other components within normal limits  LIPASE, BLOOD  URINALYSIS, ROUTINE W REFLEX MICROSCOPIC  I-STAT BETA HCG BLOOD, ED ([facility], [facility], [facility] ONLY)    EKG None  Radiology No results found.  Procedures Procedures   Medications Ordered in ED Medications - No data to display  ED Course  I have reviewed the triage vital signs and the nursing notes.  Pertinent labs & imaging results that were available during my care of the patient were reviewed by me and considered in my medical decision making (see chart for details).    MDM Rules/Calculators/A&P                          Patient hypertensive on arrival but she did not take her morning medications.  She has tenderness to palpation of the right lower quadrant, no abdominal distention.  Blood work is reassuring, abdominal labs are normal.  Ultrasound done to rule out torsion given the acute onset of the pain, and identifies a large ovarian cyst without torsion.  CT done given the significant tenderness on exam, CT again identifies a ovarian torsion without any appendicitis or other complications.  Discussed in length the diagnosis of ovarian cyst and need for outpatient follow-up.  Patient understands and offers no new concerns or complaints.  Patient will be discharged and treated as an outpatient.  Discharge plan and strict return to ED precautions discussed, patient verbalizes understanding and agreement.  Final Clinical Impression(s) / ED Diagnoses Final diagnoses:  None    Rx / DC Orders ED Discharge Orders    None       Lorelle Gibbs, DO 02/17/20 1625

## 2020-02-19 ENCOUNTER — Telehealth: Payer: Self-pay

## 2020-02-19 NOTE — Telephone Encounter (Signed)
Transition Care Management Unsuccessful Follow-up Telephone Call  Date of discharge and from where:  02/18/20 from [facility]  Attempts:  1st Attempt  Reason for unsuccessful TCM follow-up call:  Left voice message

## 2020-02-20 NOTE — Telephone Encounter (Signed)
Transition Care Management Unsuccessful Follow-up Telephone Call  Date of discharge and from where:  02/18/20 from [facility]  Attempts:  2nd Attempt  Reason for unsuccessful TCM follow-up call:  Left voice message

## 2020-02-23 NOTE — Telephone Encounter (Signed)
Transition Care Management Unsuccessful Follow-up Telephone Call  Date of discharge and from where:  02/18/20 from [facility]  Attempts:  3rd Attempt  Reason for unsuccessful TCM follow-up call:  Unable to reach patient

## 2020-03-24 ENCOUNTER — Ambulatory Visit
Admission: RE | Admit: 2020-03-24 | Discharge: 2020-03-24 | Disposition: A | Discharge status: Home / Self Care | Payer: 59 | Source: Ambulatory Visit | Attending: Family Medicine | Admitting: Family Medicine

## 2020-03-24 ENCOUNTER — Other Ambulatory Visit: Payer: Self-pay

## 2020-03-24 VITALS — BP 162/92 | HR 94 | Temp 98.2°F | Resp 18

## 2020-03-24 DIAGNOSIS — R102 Pelvic and perineal pain: Secondary | ICD-10-CM

## 2020-03-24 DIAGNOSIS — N83202 Unspecified ovarian cyst, left side: Secondary | ICD-10-CM

## 2020-03-24 LAB — POCT URINALYSIS DIP (MANUAL ENTRY)
Bilirubin, UA: NEGATIVE
Blood, UA: NEGATIVE
Glucose, UA: 500 mg/dL — AB
Ketones, POC UA: NEGATIVE mg/dL
Leukocytes, UA: NEGATIVE
Nitrite, UA: NEGATIVE
Protein Ur, POC: NEGATIVE mg/dL
Spec Grav, UA: 1.015 (ref 1.010–1.025)
Urobilinogen, UA: 0.2 E.U./dL
pH, UA: 6.5 (ref 5.0–8.0)

## 2020-03-24 MED ORDER — METHOCARBAMOL 500 MG PO TABS: 500.0000 mg | ORAL_TABLET | Freq: Two times a day (BID) | ORAL | 0 refills | Status: DC | PRN

## 2020-03-24 MED ORDER — GABAPENTIN 250 MG/5ML PO SOLN: 300.0000 mg | Freq: Three times a day (TID) | ORAL | 0 refills | Status: AC

## 2020-03-24 NOTE — ED Triage Notes (Signed)
Pt states has a cyst on lt ovary with scheduled pre op for surgery on 03/08. States having pain to lt lower abdomen and now rt lower abdomen. States hx of abnormal bleeding as well which OBGYN is aware. States pain is preventing her from working. Pt states needs work note.

## 2020-03-24 NOTE — ED Provider Notes (Signed)
[facility]    CSN: [id number] Arrival date & time: 03/24/20  1000      History   Chief Complaint Chief Complaint  Patient presents with  . appt 10  . Abdominal Pain    HPI Whitney [name] is a 35 y.o. female.   HPI Patient presents today with lower quadrant abdominal pain/pelvic pain related to lower left ovarian cyst. Patients blood pressure is elevated on arrival . Reports hasn't taken medication today. Patient reports pain is causing distress with ADL. She is unable to work. PCP appointment scheduled for 03/29/20. Gynecology appt scheduled for 03/30/20. No diffuse lose of blood. No n&v. Past Medical History:  Diagnosis Date  . Diabetes mellitus without complication ([place])   . DVT (deep venous thrombosis) ([place]) 03/2018  . Gallstones   . Headache(784.0)   . Hypertension     Patient Active Problem List   Diagnosis Date Noted  . Acute deep vein thrombosis (DVT) of popliteal vein of left lower extremity ([place])   . Essential hypertension   . Pulmonary emboli ([place]) 04/13/2018  . Type 2 diabetes mellitus ([place]) 01/17/2016  . Morbid obesity ([place]) 12/27/2015    Past Surgical History:  Procedure Laterality Date  . CHOLECYSTECTOMY N/A 03/12/2012   Procedure: LAPAROSCOPIC CHOLECYSTECTOMY;  Surgeon: Harl Bowie, MD;  Location: [place];  Service: General;  Laterality: N/A;    OB History    Gravida  1   Para  1   Term  1   Preterm  0   AB  0   Living  1     SAB  0   IAB  0   Ectopic  0   Multiple  0   Live Births  1            Home Medications    Prior to Admission medications   Medication Sig Start Date End Date Taking? Authorizing Provider  amLODipine (NORVASC) 10 MG tablet Take 1 tablet (10 mg total) by mouth daily. To lower blood sugar 03/21/19   Nicolette Bang, DO  Biotin 1 MG CAPS Take 1 mg by mouth daily with breakfast.     [provider]  carvedilol (COREG) 6.25 MG tablet Take 1 tablet (6.25 mg total) by mouth 2  (two) times daily with a meal. 06/09/19   Nicolette Bang, DO  dapagliflozin propanediol (FARXIGA) 10 MG TABS tablet Take 10 mg by mouth daily before breakfast. 03/26/19   Renato Shin, MD  glucose blood (ONETOUCH VERIO) test strip 1 each by Other route 2 (two) times daily. And lancets 2/day 03/03/19   Renato Shin, MD  hydrochlorothiazide (HYDRODIURIL) 25 MG tablet Take 0.5 tablets (12.5 mg total) by mouth daily. Patient taking differently: Take 25 mg by mouth daily. 03/26/19   Renato Shin, MD  Insulin Glargine (LANTUS SOLOSTAR) 100 UNIT/ML Solostar Pen Inject 15 Units into the skin daily. 02/03/19   Fulp, Cammie, MD  irbesartan (AVAPRO) 300 MG tablet Take 1 tablet (300 mg total) by mouth daily. 02/21/19   Fulp, Ander Gaster, MD  Lancets (ONETOUCH DELICA PLUS [id number]) [place] 1 each by Other route as directed. 03/04/19   [provider]  metFORMIN (GLUCOPHAGE-XR) 500 MG 24 hr tablet Take 2 tablets (1,000 mg total) by mouth daily with breakfast. 03/26/19   Renato Shin, MD  Multiple Vitamins-Minerals (HAIR/SKIN/NAILS) TABS Take 1 tablet by mouth daily with breakfast.    [provider]  rivaroxaban (XARELTO) 20 MG TABS tablet Take 20 mg by  mouth daily with supper.    [provider]    Family History Family History  Problem Relation Age of Onset  . Healthy Mother   . Ulcerative colitis Father   . Diabetes Maternal Grandmother   . Breast cancer Maternal Grandmother   . Syncope episode Maternal Grandfather   . Benign prostatic hyperplasia Maternal Grandfather   . Brain cancer Paternal Grandmother   . Lung cancer Paternal Grandfather   . Anesthesia problems Neg Hx   . Hypotension Neg Hx   . Malignant hyperthermia Neg Hx   . Pseudochol deficiency Neg Hx   . Colon cancer Neg Hx   . Esophageal cancer Neg Hx   . Rectal cancer Neg Hx     Social History Social History   Tobacco Use  . Smoking status: Former Smoker    Types: Cigarettes    Quit date: 05/24/2011     Years since quitting: 8.8  . Smokeless tobacco: Never Used  Vaping Use  . Vaping Use: Never used  Substance Use Topics  . Alcohol use: Yes    Comment: Once a month on average  . Drug use: No     Allergies   Patient has no known allergies.   Review of Systems Review of Systems  Pertinent negatives listed in HPI Physical Exam Triage Vital Signs ED Triage Vitals [03/24/20 1020]  Enc Vitals Group     BP (!) 182/138     Pulse Rate 94     Resp 18     Temp 98.2 F (36.8 C)     Temp Source Oral     SpO2 97 %     Weight      Height      Head Circumference      Peak Flow      Pain Score 5     Pain Loc      Pain Edu?      Excl. in [place]?    No data found.  Updated Vital Signs BP (!) 182/138 (BP Location: Left Arm) Comment: states has appt with PCP for HTN  Pulse 94   Temp 98.2 F (36.8 C) (Oral)   Resp 18   SpO2 97%   Visual Acuity Right Eye Distance:   Left Eye Distance:   Bilateral Distance:    Right Eye Near:   Left Eye Near:    Bilateral Near:     Physical Exam Constitutional:      Appearance: She is well-developed. She is obese.  HENT:     Head: Normocephalic.  Cardiovascular:     Rate and Rhythm: Normal rate and regular rhythm.  Pulmonary:     Effort: Pulmonary effort is normal.     Breath sounds: Normal breath sounds. No decreased air movement.  Abdominal:     General: Abdomen is protuberant. There is no abdominal bruit.  Skin:    General: Skin is warm.     Capillary Refill: Capillary refill takes less than 2 seconds.     Coloration: Skin is mottled.  Neurological:     General: No focal deficit present.      UC Treatments / Results  Labs (all labs ordered are listed, but only abnormal results are displayed) Labs Reviewed  POCT URINALYSIS DIP (MANUAL ENTRY) - Abnormal; Notable for the following components:      Result Value   Glucose, UA =500 (*)    All other components within normal limits    EKG   Radiology No results  found.  Procedures Procedures (including critical care time)  Medications Ordered in UC Medications - No data to display  Initial Impression / Assessment and Plan / UC Course  I have reviewed the triage vital signs and the nursing notes.  Pertinent labs & imaging results that were available during my care of the patient were reviewed by me and considered in my medical decision making (see chart for details).    Trial Gabapentin and Robaxin. Per current guidelines, avoids in opioids in management in acute abdominal pain.  No nausea or vomiting.BP elevated, concern for compliance as patient endorses inability to swallow pills. PCP appt scheduled for 03/29/20. Symptoms worsen PCP follow-up.  Final Clinical Impressions(s) / UC Diagnoses   Final diagnoses:  Pelvic pain in female  Cyst of left ovary   Discharge Instructions   None    ED Prescriptions    Medication Sig Dispense Auth. Provider   gabapentin (NEURONTIN) 250 MG/5ML solution Take 6 mLs (300 mg total) by mouth 3 (three) times daily. 470 mL Scot Jun, FNP   methocarbamol (ROBAXIN) 500 MG tablet Take 1 tablet (500 mg total) by mouth 2 (two) times daily as needed for muscle spasms (Take as needed for your abdominal cramping). 30 tablet Scot Jun, FNP     PDMP not reviewed this encounter.   Scot Jun, FNP 03/28/20 2147

## 2020-03-29 ENCOUNTER — Encounter: Payer: Self-pay | Admitting: Internal Medicine

## 2020-03-29 ENCOUNTER — Other Ambulatory Visit: Payer: Self-pay

## 2020-03-29 ENCOUNTER — Ambulatory Visit (INDEPENDENT_AMBULATORY_CARE_PROVIDER_SITE_OTHER): Payer: 59 | Admitting: Internal Medicine

## 2020-03-29 VITALS — BP 175/132 | HR 92 | Temp 98.9°F | Resp 18 | Ht 71.0 in | Wt 370.8 lb

## 2020-03-29 DIAGNOSIS — Z7901 Long term (current) use of anticoagulants: Secondary | ICD-10-CM | POA: Diagnosis not present

## 2020-03-29 DIAGNOSIS — Z01818 Encounter for other preprocedural examination: Secondary | ICD-10-CM

## 2020-03-29 DIAGNOSIS — I10 Essential (primary) hypertension: Secondary | ICD-10-CM | POA: Diagnosis not present

## 2020-03-29 DIAGNOSIS — G4733 Obstructive sleep apnea (adult) (pediatric): Secondary | ICD-10-CM

## 2020-03-29 MED ORDER — CARVEDILOL 12.5 MG PO TABS: 12.5000 mg | ORAL_TABLET | Freq: Two times a day (BID) | ORAL | 3 refills | Status: DC

## 2020-03-29 MED ORDER — HYDROCHLOROTHIAZIDE 25 MG PO TABS: 25.0000 mg | ORAL_TABLET | Freq: Every day | ORAL | Status: DC

## 2020-03-29 NOTE — Progress Notes (Signed)
Subjective:    Whitney [name] - 35 y.o. female MRN [id number]  Date of birth: 1985/06/26  HPI  Whitney [name] is here for preoperative clearance for surgery for left ovarian cyst removal. Has appointment with OBGYN tomorrow, surgery is still not scheduled. She was instructed to see her PCP in the meantime. She has a history of HTN prescribed: Amlodipine 10 mg, Coreg 6.25 mg BID, HCTZ 25 mg, Irbesartan 300 mg. She did not take her medications today. Prior to today, she was not missing any doses. Has BP cuff at home. She says BPs are not that high, but a good day would be 140/90s so still above goal.   Had at home sleep study in Jan 2021. She was diagnosed with mild OSA. Patient reports that they way she slept that night was the worst she normally sleeps. She is not willing to try CPAP machine.   She takes Xarelto for history of DVT/PE in March 2020. Was on anticoagulation for 6 months. As soon as this was discontinued, she developed another DVT and will be on long term anticoagulation.   Health Maintenance:  Health Maintenance Due  Topic Date Due  . FOOT EXAM  01/27/2017  . COVID-19 Vaccine (2 - Inadvertent 3-dose series) 06/16/2019  . OPHTHALMOLOGY EXAM  06/27/2019  . INFLUENZA VACCINE  08/24/2019  . HEMOGLOBIN A1C  09/26/2019    -  reports that she quit smoking about 8 years ago. Her smoking use included cigarettes. She has never used smokeless tobacco. - Review of Systems: Per HPI. - Past Medical History: Patient Active Problem List   Diagnosis Date Noted  . Acute deep vein thrombosis (DVT) of popliteal vein of left lower extremity ([place])   . Essential hypertension   . Pulmonary emboli ([place]) 04/13/2018  . Type 2 diabetes mellitus ([place]) 01/17/2016  . Morbid obesity ([place]) 12/27/2015   - Medications: reviewed and updated   Objective:   Physical Exam BP (!) 175/132 (BP Location: Right Arm, Patient Position: Sitting)   Pulse 92   Temp 98.9 F (37.2 C) (Oral)   Resp 18   Ht 5\' 11"   (1.803 m)   Wt (!) 370 lb 12.8 oz (168.2 kg)   SpO2 97%   BMI 51.72 kg/m  Physical Exam Constitutional:      General: She is not in acute distress.    Appearance: She is not diaphoretic.  HENT:     Head: Normocephalic and atraumatic.  Eyes:     Extraocular Movements: EOM normal.     Conjunctiva/sclera: Conjunctivae normal.  Cardiovascular:     Rate and Rhythm: Normal rate and regular rhythm.     Heart sounds: Normal heart sounds. No murmur heard.   Pulmonary:     Effort: Pulmonary effort is normal. No respiratory distress.     Breath sounds: Normal breath sounds.  Musculoskeletal:        General: Normal range of motion.  Skin:    General: Skin is warm and dry.  Neurological:     Mental Status: She is alert and oriented to person, place, and time.  Psychiatric:        Mood and Affect: Affect normal.        Judgment: Judgment normal.            Assessment & Plan:   1. Essential hypertension BP significantly above goal. Discussed with patient that would not recommend proceeding with surgery at this point due to this. Will increase Coreg as pulse appears  to be able to tolerate. Discussed with patient that untreated OSA can also lead to resistant HTN. She is unwilling to consider use of CPAP. Will refer to HTN clinic for further management.  - carvedilol (COREG) 12.5 MG tablet; Take 1 tablet (12.5 mg total) by mouth 2 (two) times daily with a meal.  Dispense: 60 tablet; Refill: 3 - Ambulatory referral to [facility]  2. Preop examination Discussed would not recommend proceeding with surgery until have obtained better HTN control. Referral placed as above. Would ask cardiology to consider if she needs further cardiac clearance testing as well especially given other co-morbidities such as morbid obesity and T2DM.   3. On anticoagulant therapy On Xarelto for h/o VTE x2. Will need to be held appropriately for surgery.   4. OSA  (obstructive sleep apnea) Have recommended use of CPAP given OSA.     Phill Myron, D.O. 03/29/2020, 4:28 PM Primary Care at [facility]

## 2020-03-29 NOTE — Progress Notes (Signed)
Reports elevated BP reading at [facility] on [place], 3/2. Needs to address for surgery, not yet scheduled.

## 2020-03-29 NOTE — Patient Instructions (Signed)

## 2020-04-09 ENCOUNTER — Telehealth: Payer: Self-pay | Admitting: Hematology and Oncology

## 2020-04-09 NOTE — Telephone Encounter (Signed)
Scheduled appt per 3/17 sch msg - called pt, no answer. Left message for patient with appt date and time

## 2020-04-13 ENCOUNTER — Other Ambulatory Visit: Payer: Self-pay | Admitting: Obstetrics & Gynecology

## 2020-04-20 ENCOUNTER — Ambulatory Visit (INDEPENDENT_AMBULATORY_CARE_PROVIDER_SITE_OTHER): Payer: 59

## 2020-04-20 ENCOUNTER — Other Ambulatory Visit: Payer: Self-pay

## 2020-04-20 DIAGNOSIS — Z111 Encounter for screening for respiratory tuberculosis: Secondary | ICD-10-CM

## 2020-04-20 NOTE — Progress Notes (Signed)
Tuberculin skin test applied to left ventral forearm. Explained to patient to return within 48-72 hours for reading and paperwork. Patient verbalized understanding.   PPD reading-Negative

## 2020-04-22 ENCOUNTER — Inpatient Hospital Stay: Payer: 59 | Admitting: Hematology and Oncology

## 2020-04-22 ENCOUNTER — Other Ambulatory Visit: Payer: Self-pay | Admitting: Hematology and Oncology

## 2020-04-22 ENCOUNTER — Inpatient Hospital Stay: Payer: 59

## 2020-04-22 ENCOUNTER — Inpatient Hospital Stay (HOSPITAL_BASED_OUTPATIENT_CLINIC_OR_DEPARTMENT_OTHER): Payer: 59 | Admitting: Hematology and Oncology

## 2020-04-22 ENCOUNTER — Inpatient Hospital Stay: Payer: 59 | Attending: Hematology and Oncology

## 2020-04-22 ENCOUNTER — Other Ambulatory Visit: Payer: Self-pay

## 2020-04-22 VITALS — BP 199/100 | HR 103 | Temp 98.0°F | Resp 18 | Wt 382.2 lb

## 2020-04-22 DIAGNOSIS — Z7901 Long term (current) use of anticoagulants: Secondary | ICD-10-CM | POA: Insufficient documentation

## 2020-04-22 DIAGNOSIS — Z86718 Personal history of other venous thrombosis and embolism: Secondary | ICD-10-CM | POA: Diagnosis present

## 2020-04-22 DIAGNOSIS — Z87891 Personal history of nicotine dependence: Secondary | ICD-10-CM | POA: Diagnosis not present

## 2020-04-22 DIAGNOSIS — Z801 Family history of malignant neoplasm of trachea, bronchus and lung: Secondary | ICD-10-CM | POA: Insufficient documentation

## 2020-04-22 DIAGNOSIS — Z86711 Personal history of pulmonary embolism: Secondary | ICD-10-CM | POA: Diagnosis present

## 2020-04-22 DIAGNOSIS — I82412 Acute embolism and thrombosis of left femoral vein: Secondary | ICD-10-CM | POA: Diagnosis not present

## 2020-04-22 DIAGNOSIS — Z803 Family history of malignant neoplasm of breast: Secondary | ICD-10-CM | POA: Insufficient documentation

## 2020-04-22 DIAGNOSIS — I82402 Acute embolism and thrombosis of unspecified deep veins of left lower extremity: Secondary | ICD-10-CM

## 2020-04-22 LAB — CMP (CANCER CENTER ONLY)
ALT: 52 U/L — ABNORMAL HIGH (ref 0–44)
AST: 34 U/L (ref 15–41)
Albumin: 3.6 g/dL (ref 3.5–5.0)
Alkaline Phosphatase: 73 U/L (ref 38–126)
Anion gap: 9 (ref 5–15)
BUN: 10 mg/dL (ref 6–20)
CO2: 27 mmol/L (ref 22–32)
Calcium: 9.4 mg/dL (ref 8.9–10.3)
Chloride: 103 mmol/L (ref 98–111)
Creatinine: 0.99 mg/dL (ref 0.44–1.00)
GFR, Estimated: >60 mL/min (ref >60)
Glucose, Bld: 304 mg/dL — ABNORMAL HIGH (ref 70–99)
Potassium: 3.7 mmol/L (ref 3.5–5.1)
Sodium: 139 mmol/L (ref 135–145)
Total Bilirubin: 0.3 mg/dL (ref 0.3–1.2)
Total Protein: 7.5 g/dL (ref 6.5–8.1)

## 2020-04-22 LAB — CBC WITH DIFFERENTIAL (CANCER CENTER ONLY)
Abs Immature Granulocytes: 0.04 10*3/uL (ref 0.00–0.07)
Basophils Absolute: 0.1 10*3/uL (ref 0.0–0.1)
Basophils Relative: 1 %
Eosinophils Absolute: 0.1 10*3/uL (ref 0.0–0.5)
Eosinophils Relative: 2 %
HCT: 38.5 % (ref 36.0–46.0)
Hemoglobin: 12.7 g/dL (ref 12.0–15.0)
Immature Granulocytes: 1 %
Lymphocytes Relative: 26 %
Lymphs Abs: 2.2 10*3/uL (ref 0.7–4.0)
MCH: 26 pg (ref 26.0–34.0)
MCHC: 33 g/dL (ref 30.0–36.0)
MCV: 78.7 fL — ABNORMAL LOW (ref 80.0–100.0)
Monocytes Absolute: 0.6 10*3/uL (ref 0.1–1.0)
Monocytes Relative: 7 %
Neutro Abs: 5.5 10*3/uL (ref 1.7–7.7)
Neutrophils Relative %: 63 %
Platelet Count: 265 10*3/uL (ref 150–400)
RBC: 4.89 MIL/uL (ref 3.87–5.11)
RDW: 13.4 % (ref 11.5–15.5)
WBC Count: 8.6 10*3/uL (ref 4.0–10.5)
nRBC: 0 % (ref 0.0–0.2)

## 2020-04-22 LAB — TB SKIN TEST
Induration: 0 mm
TB Skin Test: NEGATIVE

## 2020-04-22 NOTE — Progress Notes (Signed)
[facility] [phone]   [phone]  PROGRESS NOTE  Patient Care Team: Nicolette Bang, DO as PCP - General (Family Medicine)  Hematological/Oncological History #Pulmonary Embolism/Lower Extremity DVT 1) 04/13/2018: presented to the ED with shortness of breath, found to have moderate right sided pulmonary emboli with small peripheral left pulmonary emboli. LE doppler showed age indeterminate deep vein thrombosis involving the left popliteal vein, and left posterior tibial vein. Started on xarelto therapy 2) 10/28/2018: presented to the ED with left lower extremity distal femoral vein DVT. Her CTA was negative for PE at that time  3) 11/19/2018: establish care with Dr. Lorenso Courier  4) 04/22/2020: lost to follow up since establishing care. Reconnect with Hematology Clinic today. Will decrease to Xarelto 10mg  PO daily as maintenance therapy.   Interval History:  Whitney [name] 35 y.o. female with medical history significant for DVT/PE who presents for a follow up visit. The patient's last visit was on 11/18/2020. In the interim since the last visit she was lost to follow up from 11/19/2018 to 04/22/2020, but presents today to reconnect.  On exam today Whitney [name] reports that she has been well in the interim since her last visit in 2020.  She is continue taking the 20 mg of Xarelto daily with no major bleeding or bruising.  She is currently scheduled to undergo a D&C with OB/GYN.  At the end of 2020 she did have her Nexplanon removed and her cycles have been "on and off really heavy" since that time.  She notes that she has had no issues with anemia despite this heavy bleeding.  She does wish to maintain fertility and does desire to have one more child, but not necessarily at this time.  She currently denies any fevers, chills, sweats, nausea, vomiting or diarrhea.  A full 10 point ROS is listed below.  MEDICAL HISTORY:  Past Medical History:  Diagnosis Date   . Diabetes mellitus without complication ([place])   . DVT (deep venous thrombosis) ([facility]) 03/2018  . Gallstones   . Headache(784.0)   . Hypertension     SURGICAL HISTORY: Past Surgical History:  Procedure Laterality Date  . CHOLECYSTECTOMY N/A 03/12/2012   Procedure: LAPAROSCOPIC CHOLECYSTECTOMY;  Surgeon: Harl Bowie, MD;  Location: [facility];  Service: General;  Laterality: N/A;    SOCIAL HISTORY: Social History   Socioeconomic History  . Marital status: Single    Spouse name: Not on file  . Number of children: 1  . Years of education: 58  . Highest education level: Not on file  Occupational History  . Not on file  Tobacco Use  . Smoking status: Former Smoker    Types: Cigarettes    Quit date: 05/24/2011    Years since quitting: 8.9  . Smokeless tobacco: Never Used  Vaping Use  . Vaping Use: Never used  Substance and Sexual Activity  . Alcohol use: Yes    Comment: Once a month on average  . Drug use: No  . Sexual activity: Not Currently    Birth control/protection: None  Other Topics Concern  . Not on file  Social History Narrative   Fun: Read, hang out with her son.    Denies abuse and feels safe at home.    Social Determinants of Health   Financial Resource Strain: Not on file  Food Insecurity: Not on file  Transportation Needs: Not on file  Physical Activity: Not on file  Stress: Not on file  Social Connections:  Not on file  Intimate Partner Violence: Not on file    FAMILY HISTORY: Family History  Problem Relation Age of Onset  . Healthy Mother   . Ulcerative colitis Father   . Diabetes Maternal Grandmother   . Breast cancer Maternal Grandmother   . Syncope episode Maternal Grandfather   . Benign prostatic hyperplasia Maternal Grandfather   . Brain cancer Paternal Grandmother   . Lung cancer Paternal Grandfather   . Anesthesia problems Neg Hx   . Hypotension Neg Hx   . Malignant hyperthermia Neg Hx   . Pseudochol deficiency Neg Hx   . Colon  cancer Neg Hx   . Esophageal cancer Neg Hx   . Rectal cancer Neg Hx     ALLERGIES:  has No Known Allergies.  MEDICATIONS:  Current Outpatient Medications  Medication Sig Dispense Refill  . rivaroxaban (XARELTO) 10 MG TABS tablet Take 1 tablet (10 mg total) by mouth daily. 90 tablet 2  . amLODipine (NORVASC) 10 MG tablet Take 1 tablet (10 mg total) by mouth daily. To lower blood sugar 30 tablet 1  . Biotin 1 MG CAPS Take 1 mg by mouth daily with breakfast.     . carvedilol (COREG) 12.5 MG tablet Take 1 tablet (12.5 mg total) by mouth 2 (two) times daily with a meal. 60 tablet 3  . dapagliflozin propanediol (FARXIGA) 10 MG TABS tablet Take 10 mg by mouth daily before breakfast. 30 tablet 11  . glucose blood (ONETOUCH VERIO) test strip 1 each by Other route 2 (two) times daily. And lancets 2/day 100 each 12  . hydrochlorothiazide (HYDRODIURIL) 25 MG tablet Take 1 tablet (25 mg total) by mouth daily.    . Insulin Glargine (LANTUS SOLOSTAR) 100 UNIT/ML Solostar Pen Inject 15 Units into the skin daily. 5 pen 3  . irbesartan (AVAPRO) 300 MG tablet Take 1 tablet (300 mg total) by mouth daily. 90 tablet 0  . Lancets (ONETOUCH DELICA PLUS IOXBDZ32D) MISC 1 each by Other route as directed.    . metFORMIN (GLUCOPHAGE-XR) 500 MG 24 hr tablet Take 2 tablets (1,000 mg total) by mouth daily with breakfast. 180 tablet 3  . methocarbamol (ROBAXIN) 500 MG tablet Take 1 tablet (500 mg total) by mouth 2 (two) times daily as needed for muscle spasms (Take as needed for your abdominal cramping). 30 tablet 0  . Multiple Vitamins-Minerals (HAIR/SKIN/NAILS) TABS Take 1 tablet by mouth daily with breakfast.     No current facility-administered medications for this visit.    REVIEW OF SYSTEMS:   Constitutional: ( - ) fevers, ( - )  chills , ( - ) night sweats Eyes: ( - ) blurriness of vision, ( - ) double vision, ( - ) watery eyes Ears, nose, mouth, throat, and face: ( - ) mucositis, ( - ) sore  throat Respiratory: ( - ) cough, ( - ) dyspnea, ( - ) wheezes Cardiovascular: ( - ) palpitation, ( - ) chest discomfort, ( - ) lower extremity swelling Gastrointestinal:  ( - ) nausea, ( - ) heartburn, ( - ) change in bowel habits Skin: ( - ) abnormal skin rashes Lymphatics: ( - ) new lymphadenopathy, ( - ) easy bruising Neurological: ( - ) numbness, ( - ) tingling, ( - ) new weaknesses Behavioral/Psych: ( - ) mood change, ( - ) new changes  All other systems were reviewed with the patient and are negative.  PHYSICAL EXAMINATION:  Vitals:   04/22/20 1350  BP: (!) 199/100  Pulse: (!) 103  Resp: 18  Temp: 98 F (36.7 C)  SpO2: 98%   Filed Weights   04/22/20 1350  Weight: (!) 382 lb 3.2 oz (173.4 kg)    GENERAL: well appearing young [place] American female alert, no distress and comfortable SKIN: skin color, texture, turgor are normal, no rashes or significant lesions EYES: conjunctiva are pink and non-injected, sclera clear LUNGS: clear to auscultation and percussion with normal breathing effort HEART: regular rate & rhythm and no murmurs and no lower extremity edema Musculoskeletal: no cyanosis of digits and no clubbing  PSYCH: alert & oriented x 3, fluent speech NEURO: no focal motor/sensory deficits  LABORATORY DATA:  I have reviewed the data as listed CBC Latest Ref Rng & Units 04/22/2020 02/16/2020 02/03/2019  WBC 4.0 - 10.5 K/uL 8.6 8.9 6.6  Hemoglobin 12.0 - 15.0 g/dL 12.7 13.7 14.3  Hematocrit 36.0 - 46.0 % 38.5 42.8 42.6  Platelets 150 - 400 K/uL 265 316 270    CMP Latest Ref Rng & Units 04/22/2020 02/16/2020 02/03/2019  Glucose 70 - 99 mg/dL 304(H) 227(H) 262(H)  BUN 6 - 20 mg/dL 10 9 7   Creatinine 0.44 - 1.00 mg/dL 0.99 0.86 0.75  Sodium 135 - 145 mmol/L 139 135 137  Potassium 3.5 - 5.1 mmol/L 3.7 3.4(L) 4.0  Chloride 98 - 111 mmol/L 103 103 101  CO2 22 - 32 mmol/L 27 23 24   Calcium 8.9 - 10.3 mg/dL 9.4 9.6 10.5(H)  Total Protein 6.5 - 8.1 g/dL 7.5 7.2 7.2   Total Bilirubin 0.3 - 1.2 mg/dL 0.3 0.6 0.3  Alkaline Phos 38 - 126 U/L 73 57 70  AST 15 - 41 U/L 34 27 66(H)  ALT 0 - 44 U/L 52(H) 45(H) 83(H)    RADIOGRAPHIC STUDIES: No results found.  ASSESSMENT & PLAN KAMMY KLETT 35 y.o. female with medical history significant for DVT/PE on indefinite anticoagulation who presents for a follow up visit.   Use of Xarelto in this patient is appropriate at this time. Of note DOAC therapy has not been appropriately studied in obese populations, therefore data is markedly limited. Recent reviews have noted that therapy is acceptable as long as there is shared decision making with the patient, which has been previously explained to Whitney [name] (Blood (2020) 135 (12): [id number]).  Assessment for other inheritable mutations or hypercoagulable states is not indicated at this time as it would not alter management. Given these two clots in the last year it is apparent Whitney [name] is in a hypercoagulable state and would benefit from indefinite anticoagulation therapy.   We discussed the role of maintenance anticoagulation therapy in the setting of her indefinite anticoagulation.  She is agreeable to decreasing down to 10 mg of Xarelto p.o. daily as maintenance therapy.  #Recurrent VTEs #Left Lower Extremity DVT and Pulmonary Embolism --in the setting of 2 DVTs, with the latest having no clear inciting factor, I would recommend indefinite continuation of her anticoagulation therapy --Xarelto is a reasonable choice for her anticoagulation. She has a BMI of 50, which is well out of the original study range. More recent literature has been accepting of the use of DOACs in this population as long as risks/benefits are discussed, which we have done today --assessed for antiphospholipid antibody syndrome, however these studies were negative. No further hypercoagulable studies were indicated.   --strict return precautions for shortness of breath, LE swelling or pain, or  bleeding --assess baseline CBC and CMP prior to continuation of DOAC  therapy. Recommend patient make [place] aware if she becomes pregnant as DOACs are not recommended for pregnant patients --given she has completed 6 months of therapeutic anticoagulation, she can decrease this down to 10mg  PO daily.  --RTC in 6 months time for continued monitoring on indefinite anticoagulation.   #Perioperative Recommendations for Xarelto --patient scheduled for a D&C on 05/11/2020 --recommend HOLDING Xarelto 48-72 hours prior to procedure. Please restart at the discretion of the Ob/GYN once hemostasis is achieved.   No orders of the defined types were placed in this encounter.   All questions were answered. The patient knows to call the clinic with any problems, questions or concerns.  A total of more than 30 minutes were spent on this encounter and over half of that time was spent on counseling and coordination of care as outlined above.   Ledell Peoples, MD Department of Hematology/Oncology [place] at [facility] Phone: [phone] Pager: [phone] Email: [email or web address]  04/25/2020 3:40 PM

## 2020-04-22 NOTE — Progress Notes (Signed)
TB skin negative.

## 2020-04-25 ENCOUNTER — Encounter: Payer: Self-pay | Admitting: Hematology and Oncology

## 2020-04-25 MED ORDER — RIVAROXABAN 10 MG PO TABS: 10.0000 mg | ORAL_TABLET | Freq: Every day | ORAL | 2 refills | Status: DC

## 2020-04-27 ENCOUNTER — Telehealth: Payer: Self-pay | Admitting: Hematology and Oncology

## 2020-04-27 NOTE — Telephone Encounter (Signed)
Scheduled per los. Called and left msg. Mailed printout  °

## 2020-04-29 ENCOUNTER — Telehealth: Payer: Self-pay | Admitting: Internal Medicine

## 2020-04-29 ENCOUNTER — Telehealth: Payer: Self-pay | Admitting: Hematology and Oncology

## 2020-04-29 NOTE — Telephone Encounter (Signed)
Scheduled appt per 4/4 sch msg. Pt aware.  

## 2020-04-29 NOTE — Telephone Encounter (Addendum)
Pt requesting work note and STD forms completed.  Pt states had work note from Urgent Care ended 04/13/20. Pt  Needs out of work  04/13/20 - 05/10/20, Surgery date 05/11/20 for left ovary. Ovarian cyst pain and pain meds given at [facility] 03/24/20 making her sleepy and unable to work.  Surgeon and Urgent care told pt to contact PCP for out of work note. Surgeon will write her out for surgery and recovery only. [place] [phone].  Call pt when done & give pt copy. Forms placed in provider bin at front desk.

## 2020-04-30 IMAGING — CT CT ANGIOGRAPHY CHEST
1 of 6 series · 3 of 16 positions shown · IV contrast (omnipaque)
Comparison: Chest x-ray today.

CLINICAL DATA: Left leg pain a couple of weeks. Some shortness of
breath over the past 3 days.

EXAM:
CT ANGIOGRAPHY CHEST WITH CONTRAST
TECHNIQUE: Multidetector CT imaging of the chest was performed using the
standard protocol during bolus administration of intravenous
contrast. Multiplanar CT image reconstructions and MIPs were
obtained to evaluate the vascular anatomy.
CONTRAST:  100mL OMNIPAQUE IOHEXOL 350 MG/ML SOLN

[Series 8: pe thins · axial · 0.98mm/px · z∈[+9,+160]mm · 3 of 434 slices shown]
[im 109/434  lung]
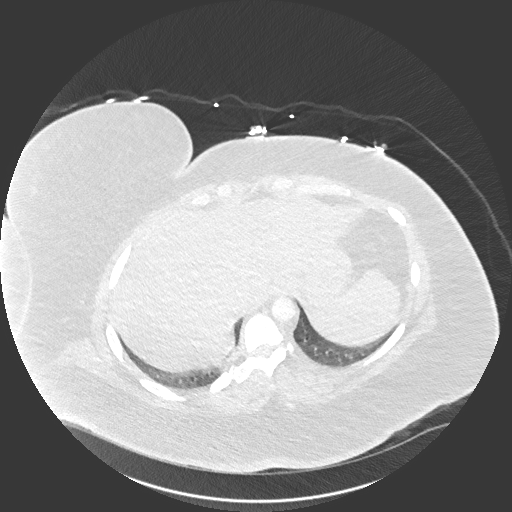
[im 217/434  soft-tissue]
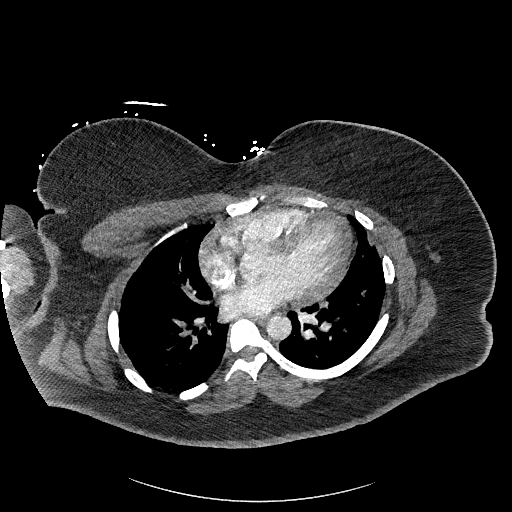
[im 325/434  lung]
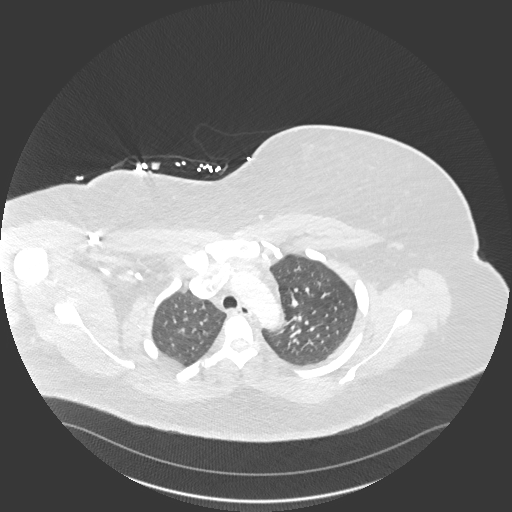

[3 of 16 positions shown; findings below may reference images not displayed]

FINDINGS: Cardiovascular: Heart size is normal. Thoracic aorta is normal.
Pulmonary arterial system is well opacified demonstrates moderate
emboli over the distal main right pulmonary artery extending into
the proximal upper, middle and lower lobar arteries. There are
several small more peripheral left-sided pulmonary emboli. There is
evidence of right heart strain with RV/LV ratio of 51.8/44.4 equals
1.16. Remaining vascular structures are unremarkable.

Mediastinum/Nodes: No significant mediastinal or hilar adenopathy.
Remaining mediastinal structures are unremarkable.

Lungs/Pleura: Lungs are well inflated without focal airspace
consolidation or effusion. Very subtle hazy left perihilar
attenuation likely mild vascular congestion. 2 mm nodular density
over the medial right upper lobe. 5 mm nodular density over the left
lower lobe. Airways are normal.

Upper Abdomen: No acute findings.

Musculoskeletal: Unremarkable.

Review of the MIP images confirms the above findings.
IMPRESSION: Moderate right-sided pulmonary emboli as described with small more
peripheral left pulmonary emboli. Positive for acute PE with CT
evidence of right heart strain (RV/LV Ratio = 1.16) consistent with
at least submassive (intermediate risk) PE. The presence of right
heart strain has been associated with an increased risk of morbidity
and mortality. Please activate Code PE by paging [phone].

Critical Value/emergent results were called by telephone at the time
of interpretation on 04/13/2018 at [DATE] to Dr. Caleb Aujla, who
verbally acknowledged these results.

## 2020-04-30 NOTE — Telephone Encounter (Signed)
Per Urgent Care visit on 03/24/2020 patient cleared to return to work on 03/29/2020.

## 2020-04-30 NOTE — Telephone Encounter (Signed)
States she saw provider at [facility] on 3/2 Was written out of work by [facility] provider.   Was seen by Dr. Juleen China 3/7- recommended at that [place] that she got BP under better control before surgery. Recommended further work up by Cardiology for clearance giving comorbidities and T2DM  States 3/22- 4/18 is when she needs a work note. She states because she was under pain medication and muscle relaxer she was not able to work. Informed patient that this medication was not rx'ed by PCP. Nor did she communicate concerns about health during this timeframe.     The surgeon has written her a note from April 19 until May.  Will send message to PCP to advise different instruction if necessary.

## 2020-05-10 ENCOUNTER — Other Ambulatory Visit: Payer: Self-pay

## 2020-05-10 ENCOUNTER — Encounter (HOSPITAL_COMMUNITY): Payer: Self-pay | Admitting: Obstetrics & Gynecology

## 2020-05-10 ENCOUNTER — Other Ambulatory Visit (HOSPITAL_COMMUNITY)
Admission: RE | Admit: 2020-05-10 | Discharge: 2020-05-10 | Disposition: A | Discharge status: Home / Self Care | Payer: 59 | Source: Ambulatory Visit | Attending: Obstetrics & Gynecology | Admitting: Obstetrics & Gynecology

## 2020-05-10 DIAGNOSIS — Z20822 Contact with and (suspected) exposure to covid-19: Secondary | ICD-10-CM | POA: Diagnosis not present

## 2020-05-10 DIAGNOSIS — Z01812 Encounter for preprocedural laboratory examination: Secondary | ICD-10-CM | POA: Diagnosis present

## 2020-05-10 LAB — SARS CORONAVIRUS 2 (TAT 6-24 HRS): SARS Coronavirus 2: NEGATIVE

## 2020-05-10 MED ORDER — CEFAZOLIN IN SODIUM CHLORIDE 3-0.9 GM/100ML-% IV SOLN
3.0000 g | INTRAVENOUS | Status: DC
  Filled 2020-05-10: qty 100

## 2020-05-10 NOTE — Anesthesia Preprocedure Evaluation (Addendum)
Anesthesia Evaluation  Patient identified by MRN, date of birth, ID band Patient awake    Reviewed: Allergy & Precautions, NPO status , Patient's Chart, lab work & pertinent test results  Airway Mallampati: II  TM Distance: >3 FB     Dental   Pulmonary sleep apnea , former smoker,    breath sounds clear to auscultation       Cardiovascular hypertension,  Rhythm:Regular Rate:Normal     Neuro/Psych  Headaches,    GI/Hepatic negative GI ROS, Neg liver ROS,   Endo/Other  diabetes  Renal/GU negative Renal ROS     Musculoskeletal   Abdominal   Peds  Hematology   Anesthesia Other Findings   Reproductive/Obstetrics                            Anesthesia Physical Anesthesia Plan  ASA: III  Anesthesia Plan: General   Post-op Pain Management:    Induction: Intravenous  PONV Risk Score and Plan: 3 and Ondansetron, Dexamethasone and Midazolam  Airway Management Planned: Oral ETT  Additional Equipment:   Intra-op Plan:   Post-operative Plan:   Informed Consent: I have reviewed the patients History and Physical, chart, labs and discussed the procedure including the risks, benefits and alternatives for the proposed anesthesia with the patient or authorized representative who has indicated his/her understanding and acceptance.     Dental advisory given  Plan Discussed with: CRNA and Anesthesiologist  Anesthesia Plan Comments: (See PAT note written 05/10/2020 by Myra Gianotti, PA-C. )       Anesthesia Quick Evaluation

## 2020-05-10 NOTE — Progress Notes (Signed)
PCP - Waynetta Pean, DO Cardiologist - n/a  Chest x-ray - n/a EKG - DOS 05/11/20 Stress Test - n/a ECHO - 04/14/18 Cardiac Cath - n/a  Sleep Study -  Yes CPAP - Mild - does not need cpap per pt   Fasting Blood Sugar -  150-170 Checks Blood Sugar 2 times a day  . Do not take metformin and farxiga on the morning of surgery.  Do not take farxiga today.  . THE MORNING OF SURGERY, take 7 units of Lantus Insulin.    . If your blood sugar is less than 70 mg/dL, you will need to treat for low blood sugar: o Treat a low blood sugar (less than 70 mg/dL) with  cup of clear juice (cranberry or apple), 4 glucose tablets, OR glucose gel. o Recheck blood sugar in 15 minutes after treatment (to make sure it is greater than 70 mg/dL). If your blood sugar is not greater than 70 mg/dL on recheck, call [phone] for further instructions.  ERAS: Clear liquids til 945 am DOS.  Last dose of Xarelto was on 05/08/20  Anesthesia review: Yes  STOP now taking any Aspirin (unless otherwise instructed by your surgeon), Aleve, Naproxen, Ibuprofen, Motrin, Advil, Goody's, BC's, all herbal medications, fish oil, and all vitamins.   Coronavirus Screening Covid test on 05/10/20 Do you have any of the following symptoms:  Cough yes/no: No Fever (>100.15F)  yes/no: No Runny nose yes/no: No Sore throat yes/no: No Difficulty breathing/shortness of breath  yes/no: No  Have you traveled in the last 14 days and where? yes/no: No  Patient verbalized understanding of instructions that were given via phone.

## 2020-05-10 NOTE — Progress Notes (Signed)
Anesthesia Chart Review: SAME DAY WORK-UP    Case: [id number] Date/Time: 05/11/20 1230   Procedures:      DILATATION AND CURETTAGE /HYSTEROSCOPY (N/A )     LAPAROSCOPY OPERATIVE (N/A ) - OVARIAN CYSTECTOMY     LAPAROSCOPIC SALPINGO OOPHORECTOMY (Left )   Anesthesia type: Choice   Pre-op diagnosis:      DYSFUNCTIONAL UTERINE BLEEDING      OVARIAN CYST   Location: [facility] OR ROOM 08 / [facility]   Surgeons: Sanjuana Kava, MD      DISCUSSION: Patient is a 35 year old female scheduled for the above procedure.  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 BP 175/132. She had not taken BP medication that day, and reported home BP readings ~ 140/90s. At that time Dr. Juleen China wrote, "Preop examination Discussed would not recommend proceeding with surgery until have obtained better HTN control. Referral placed as above. Would ask cardiology to consider if she needs further cardiac clearance testing as well especially given other co-morbidities such as morbid obesity and T2DM." Her appointment with cardiologist Skeet Latch, MD is scheduled for 05/26/2020. BP on 04/22/20 was 199/100. A1c 9.9% on 03/26/19, but > 1 year ago--reported home fasting CBGs 150-170's. Glucose 304 with 04/22/20 labs with hematology. H/H normal at 12.7/38.5. Mildly elevated ALT of 52 is consistent with previous results.  She was last evaluated by hematologist Dr. Lorenso Courier on 04/22/20. He wrote, "Assessment for other inheritable mutations or hypercoagulable states is not indicated at this time as it would not alter management. Given these two clots in the last year it is apparent Ms. Roes is in a hypercoagulable state and would benefit from  indefinite anticoagulation therapy. We discussed the role of maintenance anticoagulation therapy in the setting of her indefinite anticoagulation.  She is agreeable to decreasing down to 10 mg of Xarelto p.o. daily as maintenance therapy."  In regards to preoperative Xarelto instructions, "patient scheduled for a D&C on 05/11/2020 --recommend HOLDING Xarelto 48-72 hours prior to procedure. Please restart at the discretion of the OB/GYN once hemostasis is achieved." Last Xarelto since 05/08/20.   Patient still with elevated BP 199/100 at 04/22/20 1:30 PM visit with [facility], although reportedly had not taken BP med yets. (She told PAT RN that she is on Coreg and irbesartan, but not HCTZ and does not have amlodipine. However, her pills are in a pill box and has trouble identifying which pill is which.) Glucose 304 then, but was an afternoon lab draw, so may not have been fasting. Reportedly fasting CBGs are < 200. Her appointment with Dr. Oval Linsey is not scheduled until next month.   Discussed above with anesthesiologist Hoy Morn, MD. Primary care had recommended delaying surgery until HTN better controlled and has also referred patient to cardiology. Patient reports her last 2 BP readings in [facility] were elevated because she didn't take her BP medications yet, so difficult to say what her BP control with medication compliance. Given she has pending cardiology evaluation, recommended delaying "elective" surgery until she has had her cardiology evaluation. Ideally would also follow-up with primary care to re-evaluate HTN and DM control. However, Stacey at Dr. Tyler Aas office reported that Dr. Alwyn Pea feels this case is emergent and should proceed as  planned 05/11/20. I have updated Dr. Gifford Shave. Patient to be evaluated on the day of surgery. Elevated BP and hyperglycemia to be addressed as indicated. If cardiology consult felt indicated perioperatively then they will need to be contacted at that time.   05/10/20 presurgical  COVID-19 test is in process.   VS: Ht 5\' 11"  (1.803 m)   Wt (!) 168.2 kg   LMP  (LMP Unknown)   BMI 51.72 kg/m   BP Readings from Last 3 Encounters:  04/22/20 (!) 199/100  03/29/20 (!) 175/132  03/24/20 (!) 162/92  See DISCUSSION.   PROVIDERS: Nicolette Bang, DO is PCP  - Wilfrid Lund, MD is GI. She has also been evaluated by Roosevelt Locks, NP with [facility] in 03/2019 for elevated LFTs with hepatic steatosis. Sheran Spine, MD is [facility] - She has upcoming cardiology visit with Skeet Latch, MD on 05/26/20 due to uncontrolled HTN and CAD risk factors.Marland Kitchen    LABS: See DISCUSSION. As of 04/22/20, lab results include: Lab Results  Component Value Date   WBC 8.6 04/22/2020   HGB 12.7 04/22/2020   HCT 38.5 04/22/2020   PLT 265 04/22/2020   GLUCOSE 304 (H) 04/22/2020   ALT 52 (H) 04/22/2020   AST 34 04/22/2020   NA 139 04/22/2020   K 3.7 04/22/2020   CL 103 04/22/2020   CREATININE 0.99 04/22/2020   BUN 10 04/22/2020   CO2 27 04/22/2020   HGBA1C 9.9 (A) 03/26/2019     IMAGES: CT Abd/pelvis 02/17/20: IMPRESSION: Normal appearing appendix Hepatic steatosis 7 cm deep pelvic ovarian cyst consistent with patient's recent ultrasound   Pelvis US 02/17/20: IMPRESSION: - Unremarkable uterus and endometrial complex. - Nonvisualization of LEFT ovary. - Complex cyst within RIGHT ovary 6.6 cm greatest size, containing a single septation and scattered internal echoes and questionable mild wall thickening superiorly though this is suboptimally assessed due to body habitus and scattered artifacts. - This is considered inadequately characterized to make appropriate recommendation; therefore, MR imaging with and without contrast recommended for further characterization. - No evidence of RIGHT ovarian torsion.   EKG: Last EKG noted was from 10/28/18.    CV: Echo 04/14/18: IMPRESSIONS  1. The left ventricle has normal  systolic function, with an ejection  fraction of 60-65%. There is mildly increased left ventricular wall  thickness.  2. The right ventricle has normal systolc function. The cavity was  normal. There is no increase in right ventricular wall thickness.  3. The mitral valve is grossly normal.  4. The aortic valve is grossly normal.  5. The aortic root and ascending aorta are normal in size and structure.   Past Medical History:  Diagnosis Date  . Diabetes mellitus without complication ([facility])    type 2  . DVT (deep venous thrombosis) ([place]) 03/2018  . Gallstones   . Headache(784.0)   . Hypertension   . Sleep apnea    does not need cpap    Past Surgical History:  Procedure Laterality Date  . CHOLECYSTECTOMY N/A 03/12/2012   Procedure: LAPAROSCOPIC CHOLECYSTECTOMY;  Surgeon: Harl Bowie, MD;  Location: [place];  Service: General;  Laterality: N/A;    MEDICATIONS: No current facility-administered medications for this encounter.   Marland Kitchen amLODipine (NORVASC) 10 MG tablet  . Biotin 1 MG CAPS  . carvedilol (COREG) 12.5 MG tablet  . dapagliflozin propanediol (FARXIGA) 10 MG TABS tablet  . hydrochlorothiazide (HYDRODIURIL) 25 MG tablet  . Insulin Glargine (  LANTUS SOLOSTAR) 100 UNIT/ML Solostar Pen  . irbesartan (AVAPRO) 300 MG tablet  . metFORMIN (GLUCOPHAGE-XR) 500 MG 24 hr tablet  . methocarbamol (ROBAXIN) 500 MG tablet  . Multiple Vitamins-Minerals (HAIR/SKIN/NAILS) TABS  . rivaroxaban (XARELTO) 10 MG TABS tablet  . glucose blood (ONETOUCH VERIO) test strip  . Lancets [facility] DELICA PLUS [id number]) MISC     Myra Gianotti, PA-C Surgical Short Stay/Anesthesiology [facility] Phone [phone] [facility] Phone [phone] 05/10/2020 4:10 PM

## 2020-05-11 ENCOUNTER — Encounter (HOSPITAL_COMMUNITY)
Admission: RE | Disposition: A | Discharge status: Home / Self Care | Payer: Self-pay | Source: Home / Self Care | Attending: Obstetrics & Gynecology

## 2020-05-11 ENCOUNTER — Encounter (HOSPITAL_COMMUNITY): Payer: Self-pay | Admitting: Obstetrics & Gynecology

## 2020-05-11 ENCOUNTER — Other Ambulatory Visit: Payer: Self-pay

## 2020-05-11 ENCOUNTER — Ambulatory Visit (HOSPITAL_COMMUNITY)
Admission: RE | Admit: 2020-05-11 | Discharge: 2020-05-11 | Disposition: A | Discharge status: Home / Self Care | Payer: 59 | Attending: Obstetrics & Gynecology | Admitting: Obstetrics & Gynecology

## 2020-05-11 ENCOUNTER — Inpatient Hospital Stay (HOSPITAL_COMMUNITY): Payer: 59 | Admitting: Vascular Surgery

## 2020-05-11 DIAGNOSIS — Z794 Long term (current) use of insulin: Secondary | ICD-10-CM | POA: Insufficient documentation

## 2020-05-11 DIAGNOSIS — Z87891 Personal history of nicotine dependence: Secondary | ICD-10-CM | POA: Diagnosis not present

## 2020-05-11 DIAGNOSIS — Z79899 Other long term (current) drug therapy: Secondary | ICD-10-CM | POA: Diagnosis not present

## 2020-05-11 DIAGNOSIS — N8302 Follicular cyst of left ovary: Secondary | ICD-10-CM | POA: Insufficient documentation

## 2020-05-11 DIAGNOSIS — Z7984 Long term (current) use of oral hypoglycemic drugs: Secondary | ICD-10-CM | POA: Diagnosis not present

## 2020-05-11 DIAGNOSIS — Z7901 Long term (current) use of anticoagulants: Secondary | ICD-10-CM | POA: Insufficient documentation

## 2020-05-11 DIAGNOSIS — N838 Other noninflammatory disorders of ovary, fallopian tube and broad ligament: Secondary | ICD-10-CM | POA: Insufficient documentation

## 2020-05-11 DIAGNOSIS — N939 Abnormal uterine and vaginal bleeding, unspecified: Secondary | ICD-10-CM | POA: Diagnosis present

## 2020-05-11 DIAGNOSIS — R102 Pelvic and perineal pain: Secondary | ICD-10-CM | POA: Diagnosis present

## 2020-05-11 DIAGNOSIS — N840 Polyp of corpus uteri: Secondary | ICD-10-CM | POA: Insufficient documentation

## 2020-05-11 HISTORY — PX: LAPAROSCOPY: SHX197

## 2020-05-11 HISTORY — DX: Sleep apnea, unspecified: G47.30

## 2020-05-11 HISTORY — PX: HYSTEROSCOPY WITH D & C: SHX1775

## 2020-05-11 LAB — BASIC METABOLIC PANEL
Anion gap: 6 (ref 5–15)
BUN: 10 mg/dL (ref 6–20)
CO2: 26 mmol/L (ref 22–32)
Calcium: 9.3 mg/dL (ref 8.9–10.3)
Chloride: 104 mmol/L (ref 98–111)
Creatinine, Ser: 0.96 mg/dL (ref 0.44–1.00)
GFR, Estimated: >60 mL/min (ref >60)
Glucose, Bld: 237 mg/dL — ABNORMAL HIGH (ref 70–99)
Potassium: 3.4 mmol/L — ABNORMAL LOW (ref 3.5–5.1)
Sodium: 136 mmol/L (ref 135–145)

## 2020-05-11 LAB — CBC
HCT: 39.4 % (ref 36.0–46.0)
Hemoglobin: 12.9 g/dL (ref 12.0–15.0)
MCH: 26.3 pg (ref 26.0–34.0)
MCHC: 32.7 g/dL (ref 30.0–36.0)
MCV: 80.2 fL (ref 80.0–100.0)
Platelets: 264 10*3/uL (ref 150–400)
RBC: 4.91 MIL/uL (ref 3.87–5.11)
RDW: 12.7 % (ref 11.5–15.5)
WBC: 5.9 10*3/uL (ref 4.0–10.5)
nRBC: 0 % (ref 0.0–0.2)

## 2020-05-11 LAB — POCT PREGNANCY, URINE: Preg Test, Ur: NEGATIVE

## 2020-05-11 LAB — GLUCOSE, CAPILLARY
Glucose-Capillary: 245 mg/dL — ABNORMAL HIGH (ref 70–99)
Glucose-Capillary: 176 mg/dL — ABNORMAL HIGH (ref 70–99)

## 2020-05-11 SURGERY — DILATATION AND CURETTAGE /HYSTEROSCOPY
Anesthesia: General

## 2020-05-11 MED ORDER — BUPIVACAINE HCL 0.25 % IJ SOLN
INTRAMUSCULAR | Status: DC | PRN
  Administered 2020-05-11: 30 mL

## 2020-05-11 MED ORDER — PROPOFOL 10 MG/ML IV BOLUS
INTRAVENOUS | Status: AC
  Filled 2020-05-11: qty 20

## 2020-05-11 MED ORDER — BUPIVACAINE HCL (PF) 0.25 % IJ SOLN
INTRAMUSCULAR | Status: AC
  Filled 2020-05-11: qty 60

## 2020-05-11 MED ORDER — KETOROLAC TROMETHAMINE 30 MG/ML IJ SOLN
30.0000 mg | Freq: Once | INTRAMUSCULAR | Status: AC
  Administered 2020-05-11: 30 mg via INTRAVENOUS

## 2020-05-11 MED ORDER — CHLORHEXIDINE GLUCONATE 0.12 % MT SOLN: 15.0000 mL | Freq: Once | OROMUCOSAL | Status: AC

## 2020-05-11 MED ORDER — GLYCOPYRROLATE 0.2 MG/ML IJ SOLN
INTRAMUSCULAR | Status: DC | PRN
  Administered 2020-05-11: .2 mg via INTRAVENOUS

## 2020-05-11 MED ORDER — LABETALOL HCL 5 MG/ML IV SOLN
20.0000 mg | INTRAVENOUS | Status: DC | PRN
  Administered 2020-05-11: 20 mg via INTRAVENOUS

## 2020-05-11 MED ORDER — MENTHOL 3 MG MT LOZG: 1.0000 | LOZENGE | OROMUCOSAL | Status: DC | PRN

## 2020-05-11 MED ORDER — MIDAZOLAM HCL 2 MG/2ML IJ SOLN
2.0000 mg | Freq: Once | INTRAMUSCULAR | Status: AC
  Administered 2020-05-11: 2 mg via INTRAVENOUS

## 2020-05-11 MED ORDER — LACTATED RINGERS IV SOLN: INTRAVENOUS | Status: DC

## 2020-05-11 MED ORDER — ORAL CARE MOUTH RINSE: 15.0000 mL | Freq: Once | OROMUCOSAL | Status: AC

## 2020-05-11 MED ORDER — POVIDONE-IODINE 10 % EX SWAB: 2.0000 "application " | Freq: Once | CUTANEOUS | Status: DC

## 2020-05-11 MED ORDER — SUCCINYLCHOLINE CHLORIDE 20 MG/ML IJ SOLN
INTRAMUSCULAR | Status: DC | PRN
  Administered 2020-05-11: 100 mg via INTRAVENOUS

## 2020-05-11 MED ORDER — SUFENTANIL CITRATE 50 MCG/ML IV SOLN
INTRAVENOUS | Status: AC
  Filled 2020-05-11: qty 1

## 2020-05-11 MED ORDER — LACTATED RINGERS IV SOLN: INTRAVENOUS | Status: DC | PRN

## 2020-05-11 MED ORDER — OXYCODONE HCL 5 MG PO TABS: 5.0000 mg | ORAL_TABLET | ORAL | Status: DC | PRN

## 2020-05-11 MED ORDER — ONDANSETRON HCL 4 MG/2ML IJ SOLN: 4.0000 mg | Freq: Four times a day (QID) | INTRAMUSCULAR | Status: DC | PRN

## 2020-05-11 MED ORDER — CEFAZOLIN SODIUM-DEXTROSE 2-3 GM-%(50ML) IV SOLR
INTRAVENOUS | Status: DC | PRN
  Administered 2020-05-11: 3 g via INTRAVENOUS

## 2020-05-11 MED ORDER — LABETALOL HCL 5 MG/ML IV SOLN: 40.0000 mg | INTRAVENOUS | Status: DC | PRN

## 2020-05-11 MED ORDER — LIDOCAINE HCL 1 % IJ SOLN
INTRAMUSCULAR | Status: DC | PRN
  Administered 2020-05-11: 20 mL

## 2020-05-11 MED ORDER — HYDRALAZINE HCL 20 MG/ML IJ SOLN
INTRAMUSCULAR | Status: AC
  Filled 2020-05-11: qty 1

## 2020-05-11 MED ORDER — PHENYLEPHRINE HCL-NACL 10-0.9 MG/250ML-% IV SOLN
INTRAVENOUS | Status: DC | PRN
  Administered 2020-05-11: 30 ug/min via INTRAVENOUS

## 2020-05-11 MED ORDER — MIDAZOLAM HCL 2 MG/2ML IJ SOLN
INTRAMUSCULAR | Status: AC
  Filled 2020-05-11: qty 2

## 2020-05-11 MED ORDER — OXYCODONE HCL 5 MG/5ML PO SOLN
ORAL | Status: AC
  Filled 2020-05-11: qty 5

## 2020-05-11 MED ORDER — HYDROMORPHONE HCL 1 MG/ML IJ SOLN: 0.2000 mg | INTRAMUSCULAR | Status: DC | PRN

## 2020-05-11 MED ORDER — LIDOCAINE HCL 1 % IJ SOLN
INTRAMUSCULAR | Status: AC
  Filled 2020-05-11: qty 20

## 2020-05-11 MED ORDER — ONDANSETRON 4 MG PO TBDP: 4.0000 mg | ORAL_TABLET | Freq: Three times a day (TID) | ORAL | 0 refills | Status: DC | PRN

## 2020-05-11 MED ORDER — HYDRALAZINE HCL 20 MG/ML IJ SOLN
5.0000 mg | INTRAMUSCULAR | Status: DC | PRN
  Administered 2020-05-11: 5 mg via INTRAVENOUS

## 2020-05-11 MED ORDER — PROPOFOL 10 MG/ML IV BOLUS
INTRAVENOUS | Status: DC | PRN
  Administered 2020-05-11: 200 mg via INTRAVENOUS

## 2020-05-11 MED ORDER — LABETALOL HCL 5 MG/ML IV SOLN
INTRAVENOUS | Status: AC
  Filled 2020-05-11: qty 4

## 2020-05-11 MED ORDER — SILVER NITRATE-POT NITRATE 75-25 % EX MISC
CUTANEOUS | Status: AC
  Filled 2020-05-11: qty 10

## 2020-05-11 MED ORDER — FENTANYL CITRATE (PF) 100 MCG/2ML IJ SOLN
INTRAMUSCULAR | Status: AC
  Filled 2020-05-11: qty 2

## 2020-05-11 MED ORDER — CHLORHEXIDINE GLUCONATE 0.12 % MT SOLN
OROMUCOSAL | Status: AC
  Administered 2020-05-11: 15 mL via OROMUCOSAL
  Filled 2020-05-11: qty 15

## 2020-05-11 MED ORDER — ROCURONIUM BROMIDE 100 MG/10ML IV SOLN
INTRAVENOUS | Status: DC | PRN
  Administered 2020-05-11: 20 mg via INTRAVENOUS
  Administered 2020-05-11: 10 mg via INTRAVENOUS
  Administered 2020-05-11: 20 mg via INTRAVENOUS
  Administered 2020-05-11: 50 mg via INTRAVENOUS

## 2020-05-11 MED ORDER — SILVER NITRATE-POT NITRATE 75-25 % EX MISC
CUTANEOUS | Status: DC | PRN
  Administered 2020-05-11: 2

## 2020-05-11 MED ORDER — SUGAMMADEX SODIUM 200 MG/2ML IV SOLN
INTRAVENOUS | Status: DC | PRN
  Administered 2020-05-11: 500 mg via INTRAVENOUS

## 2020-05-11 MED ORDER — HYDRALAZINE HCL 20 MG/ML IJ SOLN
10.0000 mg | INTRAMUSCULAR | Status: DC | PRN
  Administered 2020-05-11: 10 mg via INTRAVENOUS

## 2020-05-11 MED ORDER — SIMETHICONE 80 MG PO CHEW: 80.0000 mg | CHEWABLE_TABLET | Freq: Four times a day (QID) | ORAL | Status: DC | PRN

## 2020-05-11 MED ORDER — FENTANYL CITRATE (PF) 100 MCG/2ML IJ SOLN
25.0000 ug | INTRAMUSCULAR | Status: DC | PRN
  Administered 2020-05-11 (×3): 50 ug via INTRAVENOUS

## 2020-05-11 MED ORDER — SODIUM CHLORIDE 0.9 % IR SOLN
Status: DC | PRN
  Administered 2020-05-11: 1000 mL

## 2020-05-11 MED ORDER — LABETALOL HCL 5 MG/ML IV SOLN
5.0000 mg | Freq: Once | INTRAVENOUS | Status: AC
  Administered 2020-05-11: 5 mg via INTRAVENOUS

## 2020-05-11 MED ORDER — KETOROLAC TROMETHAMINE 30 MG/ML IJ SOLN
INTRAMUSCULAR | Status: AC
  Filled 2020-05-11: qty 1

## 2020-05-11 MED ORDER — SUFENTANIL CITRATE 50 MCG/ML IV SOLN
INTRAVENOUS | Status: DC | PRN
  Administered 2020-05-11 (×2): 10 ug via INTRAVENOUS
  Administered 2020-05-11: 20 ug via INTRAVENOUS
  Administered 2020-05-11: 10 ug via INTRAVENOUS

## 2020-05-11 MED ORDER — HYDROCODONE-ACETAMINOPHEN 5-325 MG PO TABS: 1.0000 | ORAL_TABLET | Freq: Four times a day (QID) | ORAL | 0 refills | Status: DC | PRN

## 2020-05-11 MED ORDER — ONDANSETRON HCL 4 MG/2ML IJ SOLN
INTRAMUSCULAR | Status: DC | PRN
  Administered 2020-05-11: 4 mg via INTRAVENOUS

## 2020-05-11 MED ORDER — IBUPROFEN 800 MG PO TABS: 800.0000 mg | ORAL_TABLET | Freq: Three times a day (TID) | ORAL | 4 refills | Status: DC | PRN

## 2020-05-11 MED ORDER — ACETAMINOPHEN 500 MG PO TABS: 1000.0000 mg | ORAL_TABLET | Freq: Four times a day (QID) | ORAL | Status: DC

## 2020-05-11 MED ORDER — LIDOCAINE HCL (PF) 2 % IJ SOLN
INTRAMUSCULAR | Status: DC | PRN
  Administered 2020-05-11: 100 mg via INTRADERMAL

## 2020-05-11 MED ORDER — ONDANSETRON HCL 4 MG PO TABS: 4.0000 mg | ORAL_TABLET | Freq: Four times a day (QID) | ORAL | Status: DC | PRN

## 2020-05-11 SURGICAL SUPPLY — 52 items
ADH SKN CLS APL DERMABOND .7 (GAUZE/BANDAGES/DRESSINGS) ×2
APL SRG 38 LTWT LNG FL B (MISCELLANEOUS) ×2
APPLICATOR ARISTA FLEXITIP XL (MISCELLANEOUS) ×3 IMPLANT
BAG SPEC RTRVL LRG 6X4 10 (ENDOMECHANICALS)
BARRIER ADHS 3X4 INTERCEED (GAUZE/BANDAGES/DRESSINGS) ×3 IMPLANT
BIPOLAR CUTTING LOOP 21FR (ELECTRODE)
BRR ADH 4X3 ABS CNTRL BYND (GAUZE/BANDAGES/DRESSINGS) ×1
CABLE HIGH FREQUENCY MONO STRZ (ELECTRODE) IMPLANT
CATH ROBINSON RED A/P 16FR (CATHETERS) ×3 IMPLANT
DEFOGGER SCOPE WARMER CLEARIFY (MISCELLANEOUS) ×3 IMPLANT
DERMABOND ADVANCED (GAUZE/BANDAGES/DRESSINGS) ×1
DERMABOND ADVANCED .7 DNX12 (GAUZE/BANDAGES/DRESSINGS) ×2 IMPLANT
DISSECTOR BLUNT TIP ENDO 5MM (MISCELLANEOUS) IMPLANT
DRSG OPSITE POSTOP 3X4 (GAUZE/BANDAGES/DRESSINGS) IMPLANT
DURAPREP 26ML APPLICATOR (WOUND CARE) ×3 IMPLANT
ELECT REM PT RETURN 9FT ADLT (ELECTROSURGICAL)
ELECTRODE REM PT RTRN 9FT ADLT (ELECTROSURGICAL) IMPLANT
GLOVE BIO SURGEON STRL SZ 6.5 (GLOVE) ×3 IMPLANT
GLOVE SURG UNDER POLY LF SZ7 (GLOVE) ×12 IMPLANT
GOWN STRL REUS W/ TWL LRG LVL3 (GOWN DISPOSABLE) ×4 IMPLANT
GOWN STRL REUS W/TWL LRG LVL3 (GOWN DISPOSABLE) ×6
HEMOSTAT ARISTA ABSORB 3G PWDR (HEMOSTASIS) ×3 IMPLANT
KIT PROCEDURE FLUENT (KITS) ×3 IMPLANT
KIT TURNOVER KIT B (KITS) ×3 IMPLANT
LIGASURE VESSEL 5MM BLUNT TIP (ELECTROSURGICAL) ×3 IMPLANT
LOOP CUTTING BIPOLAR 21FR (ELECTRODE) IMPLANT
NS IRRIG 1000ML POUR BTL (IV SOLUTION) ×3 IMPLANT
PACK LAPAROSCOPY BASIN (CUSTOM PROCEDURE TRAY) ×3 IMPLANT
PACK TRENDGUARD 450 HYBRID PRO (MISCELLANEOUS) IMPLANT
PACK VAGINAL MINOR WOMEN LF (CUSTOM PROCEDURE TRAY) ×3 IMPLANT
PAD OB MATERNITY 4.3X12.25 (PERSONAL CARE ITEMS) ×3 IMPLANT
POUCH LAPAROSCOPIC INSTRUMENT (MISCELLANEOUS) ×3 IMPLANT
POUCH SPECIMEN RETRIEVAL 10MM (ENDOMECHANICALS) IMPLANT
PROTECTOR NERVE ULNAR (MISCELLANEOUS) ×6 IMPLANT
SCISSORS LAP 5X35 DISP (ENDOMECHANICALS) IMPLANT
SET IRRIG TUBING LAPAROSCOPIC (IRRIGATION / IRRIGATOR) IMPLANT
SET TRI-LUMEN FLTR TB AIRSEAL (TUBING) IMPLANT
SET TUBE SMOKE EVAC HIGH FLOW (TUBING) ×3 IMPLANT
SLEEVE ENDOPATH XCEL 5M (ENDOMECHANICALS) ×6 IMPLANT
SOLUTION ELECTROLUBE (MISCELLANEOUS) IMPLANT
SUT MNCRL AB 4-0 PS2 18 (SUTURE) ×3 IMPLANT
SUT MON AB 4-0 PS1 27 (SUTURE) ×3 IMPLANT
SUT VICRYL 0 UR6 27IN ABS (SUTURE) ×3 IMPLANT
SYR 5ML LL (SYRINGE) IMPLANT
TOWEL GREEN STERILE FF (TOWEL DISPOSABLE) ×6 IMPLANT
TRAY FOLEY W/BAG SLVR 14FR (SET/KITS/TRAYS/PACK) ×3 IMPLANT
TRENDGUARD 450 HYBRID PRO PACK (MISCELLANEOUS)
TROCAR 12M 150ML BLUNT (TROCAR) ×3 IMPLANT
TROCAR PORT AIRSEAL 5X120 (TROCAR) IMPLANT
TROCAR XCEL NON-BLD 11X100MML (ENDOMECHANICALS) IMPLANT
TROCAR XCEL NON-BLD 5MMX100MML (ENDOMECHANICALS) ×3 IMPLANT
WARMER LAPAROSCOPE (MISCELLANEOUS) ×3 IMPLANT

## 2020-05-11 NOTE — Transfer of Care (Signed)
Immediate Anesthesia Transfer of Care Note  Patient: Whitney [name]  Procedure(s) Performed: DILATATION AND CURETTAGE /HYSTEROSCOPY (N/A ) LAPAROSCOPY OPERATIVE; PELVIC WASHINGS (N/A )  Patient Location: PACU  Anesthesia Type:General  Level of Consciousness: alert , drowsy and patient cooperative  Airway & Oxygen Therapy: Patient Spontanous Breathing and Patient connected to face mask  Post-op Assessment: Report given to RN, Post -op Vital signs reviewed and stable and Patient moving all extremities  Post vital signs: Reviewed and stable  Last Vitals:  Vitals Value Taken Time  BP 172/124 05/11/20 1510  Temp    Pulse 91 05/11/20 1511  Resp 22 05/11/20 1511  SpO2 100 % 05/11/20 1511  Vitals shown include unvalidated device data.  Last Pain:  Vitals:   05/11/20 1112  TempSrc:   PainSc: 4       Patients Stated Pain Goal: 6 (43/01/48 4039)  Complications: No complications documented.

## 2020-05-11 NOTE — Anesthesia Procedure Notes (Signed)
Procedure Name: Intubation Date/Time: 05/11/2020 12:58 PM Performed by: Claris Che, CRNA Pre-anesthesia Checklist: Patient identified, Emergency Drugs available, Suction available, Patient being monitored and Timeout performed Patient Re-evaluated:Patient Re-evaluated prior to induction Oxygen Delivery Method: Circle system utilized Preoxygenation: Pre-oxygenation with 100% oxygen Induction Type: IV induction Ventilation: Mask ventilation without difficulty and Oral airway inserted - appropriate to patient size Laryngoscope Size: Mac and 4 Grade View: Grade I Tube type: Oral Tube size: 7.0 mm Number of attempts: 1 Airway Equipment and Method: Stylet Placement Confirmation: positive ETCO2 and ETT inserted through vocal cords under direct vision Dental Injury: Teeth and Oropharynx as per pre-operative assessment  Comments: Intubation by Jettie Pagan SRNA

## 2020-05-11 NOTE — Progress Notes (Signed)
Pt. Reports blood pressures run 160-170/105-110. Pt's blood pressure at short stay 172/107 ( right arm), rechecked 162/114 left arm. Rechecked again 180/110 right arm. Notified Dr. Nyoka Cowden , stated recheck in 15 minutes and she will be up to see pt.Marland Kitchen

## 2020-05-11 NOTE — Op Note (Signed)
OPERATIVE NOTE  Whitney [name]  DOB:    02-23-1985  MRN:    [id number]  CSN:    [id number]  Date of Surgery:  05/11/2020  Preoperative Diagnosis: Severe Pelvic pain Complex Left Ovarian Cyst Abnormal Uterine Bleeding  Postoperative Diagnosis: Same as above plus endometrial polyp  Procedure: 1. Dilatation and Curettage 2. Diagnostic Hysteroscopy 3. Diagnostic Laparoscopy 4. Laparoscopic Left Ovarian Cystectomy; Partial oopherectomy (left) 5. Pelvic Washings   Surgeon: Mady Haagensen. Alwyn Pea, M.D.  Assistant: Waymon Amato, M.D.  Anesthetic: General Endotracheal Local anesthesia  Fluids: 835mL LR   UOP: 500 mL   Catheter: Foley during case   Specimen to Pathology:  1. Left ovarian cyst wall, Part of left ovarian cortex, Left paratubal cyst, Endometrial curettings.    Indication:  [name] is a 35 y.o. year old female for a laparoscopic removal of a left ovarian cyst that is causing severe intractable pain. Patient also with heavy abnormal uterine bleeding and possible  Endometrial polyp which was found on pelvic ultrasound.  Risks, benefits, alternatives and indication was explained to patient prior to the procedure.   Findings:  Exam under anesthesia: Normal uterus, mobile anteverted, no palpable masses, Fullness palpable left adnexa  Vagina: normal ruggae, cervix grossly normal  Hysteroscopy: hypertrophic endometrium, polypoid tissue and two polyps seen in endometrium, both ostia visualized  Laparoscopy: Left simple appearing ovarian cyst with clear serous fluid, otherwise normal appearing left and right ovarian cortex, normal appearing uterus and right tube and ovary. Appendix not well visualized. Bowel and liver edge appeared normal   Procedure:  The patient was taken to the operating room after the risks, benefits, alternatives, complications, treatment options, and expected outcomes were discussed with the patient. The patient verbalized understanding, the patient  concurred with the proposed plan and consent signed and witnessed. The patient was taken to the Operating Room, identified as Whitney [name] and the procedure verified and a Time Out was held and the above information confirmed. After the attainment of adequate general anesthesia she was placed in the modified lithotomy position using Allen stirrups. Both upper extremities were padded and placed by her side. An examination under anesthesia was performed.  The perineum and vagina were prepped with multiple layers of Betadine. The bladder was drained of urine with a foley catheter. A sterile operative graves speculum was placed and the anterior lip of the cervix held with a single-toothed tenaculum. The uterus was sounded to 8 cm. A Hulka tenaculum was then placed inside the uterus.  The abdomen was prepped with multiple layers of Duraprep and patient was draped in normal sterile fashion.    Surgeon gowned gloves were changed and attention was turned to the abdomen. The subumbilical area was injected with half percent Marcaine with epinephrine. A supra umbilical incision was made using an 11 blade scalpel and the peritoneum was entered directly using a 11 mm Excel Visaport with 0 degree 22mm laparoscope attached. Confirmation of entry into the peritoneum was confirmed with opening pressure of CO2 and direct visualization. Pneumoperitoneum was obtained using approximately 2 L of CO2 gas. The pelvic organs were inspected with findings as mentioned above. Half percent Marcaine was injected in the left lower quadrant. A 5 mm trocar was placed and the abdominal cavity under direct visualization. Another 63mm trocar site was obtained on the right lower abdomen. There was no noted injury with placement of any of the trocars. Pelvic washings were performed and sent off to Cytology. The left ovarian cyst (  approximately 4cm in size) was incised on the antimesenteric side and a combination of blunt and sharp dissection (using  the monopolar cautery)  to dissect the overlying ovarian cortex off of the underlying cyst. The cyst was entered almost immediately because of the very thin cyst wall, with the egress of clear serous fluid. The dissection was continued meticulously until the entire cyst (in pieces) could be dissected off of the ovarian cortex and handed off the field to be sent to Patholgy. A small Left Paratubal cyst was ligated off using the 5 mm Ligasure.  Part of the ovarian cortex that was damaged by the cyst was removed using the Ligasure the specimen was brought out of the 47mm trochar and sent as a specimen. Monopolar cautery was used assure hemostasis in the ovarian cortex. Copious irrigation was carried out  And Arista, followed by Interceed was placed over the ovarian cortex and all operative areas noted to be hemostatic.    All trochars were then removed from the peritoneal cavity under direct visualization as the CO2 was allowed to escape.The subumbilical fascia was closed using 0 Vicryl on a UR-6 needle.  All skin incisions were closed with subcuticular sutures of 3-0 Monocryl then covered with Dermabond. The umbilical incision was covered with Tegaderm.   The cervix was dilated to 15 Fr with Hanks dilators. A 3mm hysteroscope was introduced into the uterine cavity under direct video visualization and the cavity distended with sterile normal saline. A complete survey was performed with findings noted above. The hysteroscope was removed and a sharp curettage was performed with moderate amount of polypoid tissue evacuated and sent off to Pathology.  The hysteroscope was re-introduced and the cavity was noted to be clear.   All instrumentation was removed from the vagina and the cervix was made hemostatic using silver nitrate sticks. Sponge, needle, instrument counts were correct for both the laparoscopic and vaginal portions of the procedure. The patient tolerated her procedure well. She was awakened from her  anesthetic without difficulty. She was transported to the recovery room in stable condition. The left ovary and fallopian tube with endometrial curettings were sent to pathology.     Rogelio Seen S. Alwyn Pea, M.D.     Attending Attestation: FIRST ASSISTANT WAS NEEDED FOR THE COMPLEXITY OF THE CASE

## 2020-05-11 NOTE — Anesthesia Postprocedure Evaluation (Signed)
Anesthesia Post Note  Patient: Whitney [name]  Procedure(s) Performed: DILATATION AND CURETTAGE /HYSTEROSCOPY (N/A ) LAPAROSCOPY OPERATIVE; PELVIC WASHINGS (N/A )     Patient location during evaluation: PACU Anesthesia Type: General Level of consciousness: awake Pain management: pain level controlled Vital Signs Assessment: post-procedure vital signs reviewed and stable Respiratory status: spontaneous breathing Cardiovascular status: stable Postop Assessment: no apparent nausea or vomiting Anesthetic complications: no   No complications documented.  Last Vitals:  Vitals:   05/11/20 1515 05/11/20 1539  BP: (!) 196/135 (!) 191/121  Pulse: 82 80  Resp: (!) 29 19  Temp:    SpO2: 100% 98%    Last Pain:  Vitals:   05/11/20 1539  TempSrc:   PainSc: Asleep                 Chyrel Taha

## 2020-05-11 NOTE — H&P (Signed)
Whitney [name] is an 35 y.o. female. Patient notes that she has irregular bleeding for sometime now and heavy bleeding with occasional flooding and passage of large clots. She desires preservation of her fertility. Her endometrial biopsy showed normal benign proliferative endometrium no malignancy or hyperplasia. Patient also has worsening pelvic pain. She was diagnosed with a large left ovarian cyst via pelvic ultrasound. CA 125 was normal.  Pertinent Gynecological History: Menses: flow is excessive with use of 6 pads or tampons on heaviest days Bleeding: dysfunctional uterine bleeding Contraception: condoms DES exposure: denies Blood transfusions: none Sexually transmitted diseases: no past history Previous GYN Procedures: none  Last mammogram: n/a Date: n/a Last pap: negative Date: 2019 OB History: G0, P0   Menstrual History: Menarche age: 57 Patient's last menstrual period was 05/09/2020.    Past Medical History:  Diagnosis Date  . Diabetes mellitus without complication ([place])    type 2  . DVT (deep venous thrombosis) ([place]) 03/2018  . Gallstones   . Headache(784.0)   . Hypertension   . Sleep apnea    does not need cpap    Past Surgical History:  Procedure Laterality Date  . CHOLECYSTECTOMY N/A 03/12/2012   Procedure: LAPAROSCOPIC CHOLECYSTECTOMY;  Surgeon: Harl Bowie, MD;  Location: [facility] OR;  Service: General;  Laterality: N/A;    Family History  Problem Relation Age of Onset  . Healthy Mother   . Ulcerative colitis Father   . Diabetes Maternal Grandmother   . Breast cancer Maternal Grandmother   . Syncope episode Maternal Grandfather   . Benign prostatic hyperplasia Maternal Grandfather   . Brain cancer Paternal Grandmother   . Lung cancer Paternal Grandfather   . Anesthesia problems Neg Hx   . Hypotension Neg Hx   . Malignant hyperthermia Neg Hx   . Pseudochol deficiency Neg Hx   . Colon cancer Neg Hx   . Esophageal cancer Neg Hx   . Rectal cancer Neg Hx      Social History:  reports that she quit smoking about 8 years ago. Her smoking use included cigarettes. She has never used smokeless tobacco. She reports current alcohol use. She reports that she does not use drugs.  Allergies: No Known Allergies  Medications Prior to Admission  Medication Sig Dispense Refill Last Dose  . amLODipine (NORVASC) 10 MG tablet Take 1 tablet (10 mg total) by mouth daily. To lower blood sugar 30 tablet 1 05/11/2020 at 0915  . carvedilol (COREG) 12.5 MG tablet Take 1 tablet (12.5 mg total) by mouth 2 (two) times daily with a meal. 60 tablet 3 05/11/2020 at 0915  . dapagliflozin propanediol (FARXIGA) 10 MG TABS tablet Take 10 mg by mouth daily before breakfast. 30 tablet 11 05/10/2020 at Unknown time  . hydrochlorothiazide (HYDRODIURIL) 25 MG tablet Take 1 tablet (25 mg total) by mouth daily.   05/10/2020 at Unknown time  . Insulin Glargine (LANTUS SOLOSTAR) 100 UNIT/ML Solostar Pen Inject 15 Units into the skin daily. 5 pen 3 05/11/2020 at 0915  . irbesartan (AVAPRO) 300 MG tablet Take 1 tablet (300 mg total) by mouth daily. 90 tablet 0 Past Week at Unknown time  . metFORMIN (GLUCOPHAGE-XR) 500 MG 24 hr tablet Take 2 tablets (1,000 mg total) by mouth daily with breakfast. 180 tablet 3 05/10/2020 at Unknown time  . methocarbamol (ROBAXIN) 500 MG tablet Take 1 tablet (500 mg total) by mouth 2 (two) times daily as needed for muscle spasms (Take as needed for your abdominal cramping). [place]  tablet 0 Past Week at Unknown time  . Multiple Vitamins-Minerals (HAIR/SKIN/NAILS) TABS Take 1 tablet by mouth daily with breakfast.   Past Week at Unknown time  . rivaroxaban (XARELTO) 10 MG TABS tablet Take 1 tablet (10 mg total) by mouth daily. 90 tablet 2 05/08/2020  . Biotin 1 MG CAPS Take 1 mg by mouth daily with breakfast.    More than a month at Unknown time  . glucose blood (ONETOUCH VERIO) test strip 1 each by Other route 2 (two) times daily. And lancets 2/day 100 each 12   . Lancets  (ONETOUCH DELICA PLUS [id number]) MISC 1 each by Other route as directed.       Review of Systems   As per HPI   Physical Exam  Blood pressure (!) 189/118, pulse 83, temperature 98.5 F (36.9 C), temperature source Oral, resp. rate 18, height 5\' 11"  (1.803 m), weight (!) 165.4 kg, last menstrual period 05/09/2020, SpO2 100 %.  Female Genitalia: Vulva: no erythema, excoriation, atrophy, discoloration, lesions, vesicles, masses, swelling, or tenderness and normal. [place]: no erythema, excoriation, atrophy, lesions, masses, swelling, tenderness, or vesicles/ ulcers and normal. Labia Majora: no erythema, excoriation, atrophy, discoloration, lesions, masses, swelling, tenderness, or vesicles/ ulcers and normal. Labia Minora: no erythema, excoriation, atrophy, discoloration, lesions, vesicles, masses, swelling, or tenderness and normal. Introitus: normal. Bartholin's Gland: normal. Vagina: no discharge, erythema, atrophy, lesions, ulcers, swelling, masses, tenderness, prolapse, or blood present and normal. Vaginal Cuff: intact, well-healed, and no tenderness. Cervix: no lesions, discharge, bleeding, or cervical motion tenderness and grossly normal. Uterus: midline, mobile, non-tender, normal contour, no uterine prolapse, and enlarged 12 weeks size. Urethral Meatus/ Urethra no discharge, masses, or tenderness and normal meatus and well supported urethra. Bladder: non-distended, non-tender, and no palpable mass. Adnexa/Parametria: adnexal mass left and tenderness left.  Rectum: Anus & Perineum: normal perineum and perianal skin and no hemorrhoids or anal fissure. Digital Rectal: exam deferred. Results for orders placed or performed during the hospital encounter of 05/11/20 (from the past 24 hour(s))  Basic metabolic panel     Status: Abnormal   Collection Time: 05/11/20 10:54 AM  Result Value Ref Range   Sodium 136 135 - 145 mmol/L   Potassium 3.4 (L) 3.5 - 5.1 mmol/L   Chloride 104 98 - 111 mmol/L   CO2 26  22 - 32 mmol/L   Glucose, Bld 237 (H) 70 - 99 mg/dL   BUN 10 6 - 20 mg/dL   Creatinine, Ser 0.96 0.44 - 1.00 mg/dL   Calcium 9.3 8.9 - 10.3 mg/dL   GFR, Estimated >60 >60 mL/min   Anion gap 6 5 - 15  CBC     Status: None   Collection Time: 05/11/20 10:54 AM  Result Value Ref Range   WBC 5.9 4.0 - 10.5 K/uL   RBC 4.91 3.87 - 5.11 MIL/uL   Hemoglobin 12.9 12.0 - 15.0 g/dL   HCT 39.4 36.0 - 46.0 %   MCV 80.2 80.0 - 100.0 fL   MCH 26.3 26.0 - 34.0 pg   MCHC 32.7 30.0 - 36.0 g/dL   RDW 12.7 11.5 - 15.5 %   Platelets 264 150 - 400 K/uL   nRBC 0.0 0.0 - 0.2 %  Glucose, capillary     Status: Abnormal   Collection Time: 05/11/20 10:57 AM  Result Value Ref Range   Glucose-Capillary 245 (H) 70 - 99 mg/dL  Pregnancy, urine POC     Status: None   Collection Time: 05/11/20 11:32 AM  Result Value Ref Range   Preg Test, Ur NEGATIVE NEGATIVE    Office Ultrasound: Transabdominal / transvaginal ultrasound performed.  Anteverted uterus. Fibroids: #1 Anterior fundal intramural Endometrium appears within normal limits Right ovary appears within normal limits - best seen transabdominally -no obvious cyst left ovary appears more anterior in pelvis contains a complicated cyst w/ single septation --fluid ovarall appears simple, measuring 5.1x3.9x4.3cm Bilateral ovarian dopplers show normal appearsing arterial / venous waveforms. No free fluid in culdesac Bilateral adnexas appear otherwise unremarkable Bladder appears unremarkable  Assessment/Plan: 35 y/o with left ovarian cyst, fluid is simple appearing on u/s but with some complex features - one septation. Patient also with long history of abnormal uterine bleeding. Discussed the low overall suspicion for malignancy with regards to the ovarian cyst. However,  Patient now symptomatic with severe pain, therefore patient counseled that the ovarian cyst should be removed surgically. The patient was counseled on the risks, benefits, indications and  alternatives of a left (right) ovarian cystectomy. Patient understands that the left ovary may be damaged in trying to remove the cyst therefore there is a remote possibility of a L salpingo-oophorectomy, she understands that the goal is ovarian conservation.    Patient also counseled that if there is evidence of an injury to any other organs there may be a need to convert to an open laparotomy procedure. The patient understands that if pathology shows malignancy she may need further staging with [facility]. Risks of surgery were explained to include, but are not limited to that of infection or blood loss, injury to bowel, bladder, ureters, nerves arteries or veins. She understands the risks and benefits and desires definitive surgical management of her L adnexal mass.  We also discussed risks of D&C hysteroscopy to include uterine perforation, Asherman's Syndrome, bleeding and infection.   Patient voiced understanding and agrees to proceed with Laparoscopic ovarian cystectomy possible Left salpingoopherectomy and Dilatation and Curettage, diagnostic hysteroscopy. Consent signed, witnessed and placed into chart.   Rogelio Seen Carissa Musick 05/11/2020, 12:23 PM

## 2020-05-12 ENCOUNTER — Encounter (HOSPITAL_COMMUNITY): Payer: Self-pay | Admitting: Obstetrics & Gynecology

## 2020-05-12 LAB — SURGICAL PATHOLOGY

## 2020-05-12 LAB — CYTOLOGY - NON PAP

## 2020-05-26 ENCOUNTER — Other Ambulatory Visit: Payer: Self-pay

## 2020-05-26 ENCOUNTER — Telehealth: Payer: Self-pay

## 2020-05-26 ENCOUNTER — Ambulatory Visit (INDEPENDENT_AMBULATORY_CARE_PROVIDER_SITE_OTHER): Payer: 59 | Admitting: Cardiovascular Disease

## 2020-05-26 ENCOUNTER — Encounter: Payer: Self-pay | Admitting: Cardiovascular Disease

## 2020-05-26 VITALS — BP 192/140 | HR 86 | Ht 71.0 in | Wt 371.4 lb

## 2020-05-26 DIAGNOSIS — G4733 Obstructive sleep apnea (adult) (pediatric): Secondary | ICD-10-CM | POA: Diagnosis not present

## 2020-05-26 DIAGNOSIS — Z5181 Encounter for therapeutic drug level monitoring: Secondary | ICD-10-CM

## 2020-05-26 DIAGNOSIS — I10 Essential (primary) hypertension: Secondary | ICD-10-CM

## 2020-05-26 DIAGNOSIS — I2782 Chronic pulmonary embolism: Secondary | ICD-10-CM

## 2020-05-26 DIAGNOSIS — Z0000 Encounter for general adult medical examination without abnormal findings: Secondary | ICD-10-CM

## 2020-05-26 HISTORY — DX: Obstructive sleep apnea (adult) (pediatric): G47.33

## 2020-05-26 MED ORDER — OLMESARTAN-AMLODIPINE-HCTZ 20-5-12.5 MG PO TABS: 1.0000 | ORAL_TABLET | Freq: Every day | ORAL | 1 refills | Status: DC

## 2020-05-26 NOTE — Assessment & Plan Note (Signed)
Mild OSA on prior sleep study.  She reported struggling with wearing advice because it was hard to sleep electrodiagnostic equipment:.  We discussed focusing on diet and exercise with weight loss for now.

## 2020-05-26 NOTE — Progress Notes (Signed)
Hypertension Clinic Initial Assessment:    Date:  05/26/2020   ID:  Whitney [name], DOB 06-30-1985, MRN [id number]  PCP:  Nicolette Bang, DO  Cardiologist:  None  Nephrologist:  Referring MD: Caryl Never*   CC: Hypertension  History of Present Illness:    Whitney [name] is a 35 y.o. female with a hx of hypertension, diabetes, and morbid obesity, here to establish care in the hypertension clinic. She had surgery for an ovarian cyst and uterine bleeding on 05/11/20. Her blood pressure in the OR was 196/35, so she was referred to advanced hypertension clinic. She previously has an echo 03/2020 that revealed LVEF 60-65% with mild LVH and was otherwise normal.  Today, she reports having hypertension for a while, but did not become concerning until 2020 when she had a PE.  This occurred shortly after she returned from a long car ride. She was on Xarelto for 6 months, was discontinued, and had a recurrent PE, so she is on lifelong anticoagulation. She recently started a new less stressful job. Her previous job was stressful, answering phones and emails. She wanted to begin exercising earlier this year as a new year's resolution, but since developing the ovarian cyst she was fearful of exercising. When she does exercise she has no exertional symptoms. She does have LE edema mostly around her ankles, that usually improves by the morning. She does snore. At the end of 2020 she was having insomnia, and a home sleep test found mild sleep apnea. However, she notes having unusual difficulty trying to fall asleep during the test. If she goes to sleep at a decent hour she will feel rested, but she sometimes struggles with this.  She sometimes drinks several energy drinks to stay awake but not recently.  She is monitoring her diet well, eating more salads, and avoiding fast food this past month.  Her 27 year old son was also diagnosed with HTN recently so she is working on lifestyle changes.    Currently not using any supplements or herbs, although she did try apple cider vinegar inconsistently. She will drink alcohol about once or twice a month. She denies any heart racing/palpitations or flushing. She notes not eating as much during the day due to work, and occasionally will have headaches. She does check her blood pressure at home, and it is normally 170-190/90. When she has pain, she will go to sleep rather than take any medications, except for severe pain. Taking medications can be difficult as the taste makes her nauseous and she may avoid taking them. Typically, she will crush her medications and mix them with food or drink (applesauce, flavored drinks) to help mask the taste. Also, she is stressed over recent events and health concerns, and is unsure if this contributes to her symptoms. She usually talks with family and friends for support and is the main caretaker of her son. She was first diagnosed with diabetes on 12/2015. She believes her maternal grandmother had hypertension and diabetes. Otherwise she denies any known family history of heart disease. She denies any chest pain, shortness of breath, or syncope.    Previous antihypertensives: Amlodipine Carvedilol HCTZ Irbesartan  Losartan (all discontinued to consolidate regimen)   Past Medical History:  Diagnosis Date  . Diabetes mellitus without complication ([place])    type 2  . DVT (deep venous thrombosis) ([place]) 03/2018  . Gallstones   . Headache(784.0)   . Hypertension   . OSA (obstructive sleep apnea) 05/26/2020  .  Sleep apnea    does not need cpap    Past Surgical History:  Procedure Laterality Date  . CHOLECYSTECTOMY N/A 03/12/2012   Procedure: LAPAROSCOPIC CHOLECYSTECTOMY;  Surgeon: Harl Bowie, MD;  Location: [facility];  Service: General;  Laterality: N/A;  . HYSTEROSCOPY WITH D & C N/A 05/11/2020   Procedure: DILATATION AND CURETTAGE /HYSTEROSCOPY;  Surgeon: Sanjuana Kava, MD;  Location: [facility];  Service:  Gynecology;  Laterality: N/A;  . LAPAROSCOPY N/A 05/11/2020   Procedure: LAPAROSCOPY OPERATIVE; PELVIC WASHINGS;  Surgeon: Sanjuana Kava, MD;  Location: [facility];  Service: Gynecology;  Laterality: N/A;  OVARIAN CYSTECTOMY    Current Medications: Current Meds  Medication Sig  . Cephalexin 500 MG tablet cephalexin 500 mg tablet  Take 1 tablet every 6 hours by oral route for 7 days.  . dapagliflozin propanediol (FARXIGA) 10 MG TABS tablet Take 10 mg by mouth daily before breakfast.  . glucose blood (ONETOUCH VERIO) test strip 1 each by Other route 2 (two) times daily. And lancets 2/day  . Insulin Glargine (LANTUS SOLOSTAR) 100 UNIT/ML Solostar Pen Inject 15 Units into the skin daily.  . Lancets (ONETOUCH DELICA PLUS [id number]) [place] 1 each by Other route as directed.  . metFORMIN (GLUCOPHAGE-XR) 500 MG 24 hr tablet Take 2 tablets (1,000 mg total) by mouth daily with breakfast.  . Multiple Vitamins-Minerals (HAIR/SKIN/NAILS) TABS Take 1 tablet by mouth daily with breakfast.  . Olmesartan-amLODIPine-HCTZ 20-5-12.5 MG TABS Take 1 tablet by mouth daily.  . rivaroxaban (XARELTO) 10 MG TABS tablet Take 1 tablet (10 mg total) by mouth daily.  . [DISCONTINUED] amLODipine (NORVASC) 10 MG tablet Take 1 tablet (10 mg total) by mouth daily. To lower blood sugar  . [DISCONTINUED] carvedilol (COREG) 12.5 MG tablet Take 1 tablet (12.5 mg total) by mouth 2 (two) times daily with a meal.  . [DISCONTINUED] hydrochlorothiazide (HYDRODIURIL) 25 MG tablet Take 1 tablet (25 mg total) by mouth daily.  . [DISCONTINUED] irbesartan (AVAPRO) 300 MG tablet Take 1 tablet (300 mg total) by mouth daily.     Allergies:   Patient has no known allergies.   Social History   Socioeconomic History  . Marital status: Single    Spouse name: Not on file  . Number of children: 1  . Years of education: 107  . Highest education level: Not on file  Occupational History  . Not on file  Tobacco Use  . Smoking status: Former Smoker     Types: Cigarettes    Quit date: 05/24/2011    Years since quitting: 9.0  . Smokeless tobacco: Never Used  Vaping Use  . Vaping Use: Never used  Substance and Sexual Activity  . Alcohol use: Yes    Comment: Once a month on average  . Drug use: No  . Sexual activity: Not Currently    Birth control/protection: None  Other Topics Concern  . Not on file  Social History Narrative   Fun: Read, hang out with her son.    Denies abuse and feels safe at home.    Social Determinants of Health   Financial Resource Strain: Low Risk   . Difficulty of Paying Living Expenses: Not hard at all  Food Insecurity: No Food Insecurity  . Worried About Charity fundraiser in the Last Year: Never true  . Ran Out of Food in the Last Year: Never true  Transportation Needs: No Transportation Needs  . Lack of Transportation (Medical): No  . Lack of Transportation (Non-Medical): No  Physical Activity: Inactive  . Days of Exercise per Week: 0 days  . Minutes of Exercise per Session: 0 min  Stress: Stress Concern Present  . Feeling of Stress : To some extent  Social Connections: Not on file     Family History: The patient's family history includes Benign prostatic hyperplasia in her maternal grandfather; Brain cancer in her paternal grandmother; Breast cancer in her maternal grandmother; Diabetes in her maternal grandmother; Healthy in her mother; Hypertension in her maternal grandmother; Hypertension (age of onset: 81) in her son; Lung cancer in her paternal grandfather; Syncope episode in her maternal grandfather; Ulcerative colitis in her father. There is no history of Anesthesia problems, Hypotension, Malignant hyperthermia, Pseudochol deficiency, Colon cancer, Esophageal cancer, or Rectal cancer.  ROS:   Please see the history of present illness.    (+) Stress/Anxiety (+) Bilateral ankle edema (+) Snoring (+) Headaches All other systems reviewed and are negative.  EKGs/Labs/Other Studies Reviewed:     EKG:   05/26/2020: EKG is not ordered today.   Echo 04/14/18: 1. The left ventricle has normal systolic function, with an ejection  fraction of 60-65%. There is mildly increased left ventricular wall  thickness.  2. The right ventricle has normal systolc function. The cavity was  normal. There is no increase in right ventricular wall thickness.  3. The mitral valve is grossly normal.  4. The aortic valve is grossly normal.  5. The aortic root and ascending aorta are normal in size and structure.   VAS [place] LOWER EXTREMITY VENOUS 10/28/18: Summary:  Right: No evidence of common femoral vein obstruction.  Left: Findings consistent with age indeterminate deep vein thrombosis  involving the left distal femoral vein.   Recent Labs: 04/22/2020: ALT 52 05/11/2020: BUN 10; Creatinine, Ser 0.96; Hemoglobin 12.9; Platelets 264; Potassium 3.4; Sodium 136   Recent Lipid Panel    Component Value Date/Time   CHOL 137 05/22/2018 0920   TRIG 112 05/22/2018 0920   HDL 42 05/22/2018 0920   CHOLHDL 3.3 05/22/2018 0920   CHOLHDL 3 12/27/2015 1014   VLDL 13.2 12/27/2015 1014   LDLCALC 73 05/22/2018 0920    Physical Exam:   VS:  BP (!) 192/140   Pulse 86   Ht 5\' 11"  (1.803 m)   Wt (!) 371 lb 6.4 oz (168.5 kg)   LMP 05/09/2020   SpO2 94%   BMI 51.80 kg/m  , BMI Body mass index is 51.8 kg/m. GENERAL:  Well appearing HEENT: Pupils equal round and reactive, fundi not visualized, oral mucosa unremarkable NECK:  No jugular venous distention, waveform within normal limits, carotid upstroke brisk and symmetric, no bruits. LUNGS:  Clear to auscultation bilaterally HEART:  RRR.  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  Will get her a co-pay card.  OSA (obstructive sleep apnea) Mild OSA on prior sleep study.  She reported struggling with wearing advice because it was hard to sleep  electrodiagnostic equipment:.  We discussed focusing on diet and exercise with weight loss for now.  Morbid obesity ([place]) Referral to PREP.  Increasing exercise weekly.  Will consider referral to healthy weight and wellness after she completes her time with PREP.  Essential hypertension Blood pressure is poorly controlled despite being prescribed multiple medications.  She is struggled with compliance and taking multiple medications at current time.  I think she would do better with eating poorly.  We will prescribe Tribenzor 20/5/12.5 mg daily.  She will have a basic metabolic panel checked in 1 week.  We will also check her thyroid at that time.  She is interested in enrolling in our remote patient monitoring program through [employer].  She gives her consent and was trained on the device today.  Seems that stress is also contributing.  We will have her see Dr. Michail Sermon to discuss stress and disease management coping skills.  She had a CT scan earlier this year that revealed patent renal arteries and no adrenal adenomas.  We will work on diet and exercise.  She received the hypertension clinic booklet and was instructed to limit her sodium to 1500 mg daily.   Screening for Secondary Hypertension:  Causes 05/26/2020  Drugs/Herbals Screened     - Comments caffeine- Mt. Dew, 5 Hour energy  Renovascular HTN Screened     - Comments CT-A negative 2022  Sleep Apnea Screened     - Comments Mild OSA. Focused on diet/exercise and weight loss for now.   Hyperthyroidism Screened     - Comments check TSH  Hypothyroidism Screened     - Comments Check TSH  Hyperaldosteronism Screened     - Comments No adrenal adenomas on CT-A 01/2020  Pheochromocytoma Screened     - Comments No adrenal adenomas on CT-A 01/2020  Cushing's Syndrome N/A  Hyperparathyroidism N/A  Coarctation of the Aorta Screened     - Comments BP symmetric  Compliance Screened     - Comments Patient non-compliant.  Called pharmacy and several meds had not been picked up this year.  She struggles with taking them all and taking them at the same time.    Relevant Labs/Studies: Basic Labs Latest Ref Rng & Units 05/11/2020 04/22/2020 02/16/2020  Sodium 135 - 145 mmol/L 136 139 135  Potassium 3.5 - 5.1 mmol/L 3.4(L) 3.7 3.4(L)  Creatinine 0.44 - 1.00 mg/dL 0.96 0.99 0.86    Thyroid  Latest Ref Rng & Units 04/14/2018 12/27/2015  TSH 0.350 - 4.500 uIU/mL 1.993 1.35                   Disposition:    FU with MD/PharmD in 2 months. FU with Ethleen Lormand C. Oval Linsey, MD, [facility] In 4 months.   Medication Adjustments/Labs and Tests Ordered: Current medicines are reviewed at length with the patient today.  Concerns regarding medicines are outlined above.  Orders Placed This Encounter  Procedures  . Basic metabolic panel   Meds ordered this encounter  Medications  . Olmesartan-amLODIPine-HCTZ 20-5-12.5 MG TABS    Sig: Take 1 tablet by mouth daily.    Dispense:  90 tablet    Refill:  1    D/C CARVEDILOL, AMLODIPINE, LOSARTAN, IRBESARTAN AND HCTZ RX'S    I,Mathew Stumpf,acting as a scribe for Skeet Latch, MD.,have documented all relevant documentation on the behalf of Skeet Latch, MD,as directed by  Skeet Latch, MD while in the presence of Skeet Latch, MD.  I, [place] Oval Linsey, MD have reviewed all  documentation for this visit.  The documentation of the exam, diagnosis, procedures, and orders on 05/26/2020 are all accurate and complete.   Signed, Skeet Latch, MD  05/26/2020 10:40 AM    [facility]

## 2020-05-26 NOTE — Patient Instructions (Addendum)
Medication Instructions:  STOP AMLODIPINE, LOSARTAN/IRBESARTAN, HYDROCHLOROTHIAZIDE, AND CARVEDILOL   START OLMESARTAN-AMLODIPINE-HCT 20-5-12.5 MG DAILY   Labwork: BMET/TSH IN 1 WEEK    Testing/Procedures: NONE   Follow-Up: 07/27/2020 AT 10:00 AM WITH PHARM D   You will receive a phone call from the [facility] exercise and nutrition program to schedule an initial assessment.  Referrals:  CALL DR Michail Sermon AT TO GET APPOINTMENT [phone] IF THERE ARE ANY ISSUES GETTING AN APPOINTMENT PLEASE CALL THE OFFICE AT [phone]   Special Instructions:  MONITOR YOU BLOOP PRESSURE TWICE A DAY WITH MACHINE PROVIDED   DASH Eating Plan DASH stands for "Dietary Approaches to Stop Hypertension." The DASH eating plan is a healthy eating plan that has been shown to reduce high blood pressure (hypertension). It may also reduce your risk for type 2 diabetes, heart disease, and stroke. The DASH eating plan may also help with weight loss. What are tips for following this plan?  General guidelines  Avoid eating more than 2,300 mg (milligrams) of salt (sodium) a day. If you have hypertension, you may need to reduce your sodium intake to 1,500 mg a day.  Limit alcohol intake to no more than 1 drink a day for nonpregnant women and 2 drinks a day for men. One drink equals 12 oz of beer, 5 oz of wine, or 1 oz of hard liquor.  Work with your health care provider to maintain a healthy body weight or to lose weight. Ask what an ideal weight is for you.  Get at least 30 minutes of exercise that causes your heart to beat faster (aerobic exercise) most days of the week. Activities may include walking, swimming, or biking.  Work with your health care provider or diet and nutrition specialist (dietitian) to adjust your eating plan to your individual calorie needs. Reading food labels   Check food labels for the amount of sodium per serving. Choose foods with less than 5 percent of the Daily Value of sodium.  Generally, foods with less than 300 mg of sodium per serving fit into this eating plan.  To find whole grains, look for the word "whole" as the first word in the ingredient list. Shopping  Buy products labeled as "low-sodium" or "no salt added."  Buy fresh foods. Avoid canned foods and premade or frozen meals. Cooking  Avoid adding salt when cooking. Use salt-free seasonings or herbs instead of table salt or sea salt. Check with your health care provider or pharmacist before using salt substitutes.  Do not fry foods. Cook foods using healthy methods such as baking, boiling, grilling, and broiling instead.  Cook with heart-healthy oils, such as olive, canola, soybean, or sunflower oil. Meal planning  Eat a balanced diet that includes: ? 5 or more servings of fruits and vegetables each day. At each meal, try to fill half of your plate with fruits and vegetables. ? Up to 6-8 servings of whole grains each day. ? Less than 6 oz of lean meat, poultry, or fish each day. A 3-oz serving of meat is about the same size as a deck of cards. One egg equals 1 oz. ? 2 servings of low-fat dairy each day. ? A serving of nuts, seeds, or beans 5 times each week. ? Heart-healthy fats. Healthy fats called Omega-3 fatty acids are found in foods such as flaxseeds and coldwater fish, like sardines, salmon, and mackerel.  Limit how much you eat of the following: ? Canned or prepackaged foods. ? Food that is high in trans  fat, such as fried foods. ? Food that is high in saturated fat, such as fatty meat. ? Sweets, desserts, sugary drinks, and other foods with added sugar. ? Full-fat dairy products.  Do not salt foods before eating.  Try to eat at least 2 vegetarian meals each week.  Eat more home-cooked food and less restaurant, buffet, and fast food.  When eating at a restaurant, ask that your food be prepared with less salt or no salt, if possible. What foods are recommended? The items listed may not  be a complete list. Talk with your dietitian about what dietary choices are best for you. Grains Whole-grain or whole-wheat bread. Whole-grain or whole-wheat pasta. Brown rice. [place]. Bulgur. Whole-grain and low-sodium cereals. Pita bread. Low-fat, low-sodium crackers. Whole-wheat flour tortillas. Vegetables Fresh or frozen vegetables (raw, steamed, roasted, or grilled). Low-sodium or reduced-sodium tomato and vegetable juice. Low-sodium or reduced-sodium tomato sauce and tomato paste. Low-sodium or reduced-sodium canned vegetables. Fruits All fresh, dried, or frozen fruit. Canned fruit in natural juice (without added sugar). Meat and other protein foods Skinless chicken or [place]. Ground chicken or [place]. Pork with fat trimmed off. Fish and seafood. Egg whites. Dried beans, peas, or lentils. Unsalted nuts, nut butters, and seeds. Unsalted canned beans. Lean cuts of beef with fat trimmed off. Low-sodium, lean deli meat. Dairy Low-fat (1%) or fat-free (skim) milk. Fat-free, low-fat, or reduced-fat cheeses. Nonfat, low-sodium ricotta or cottage cheese. Low-fat or nonfat yogurt. Low-fat, low-sodium cheese. Fats and oils Soft margarine without trans fats. Vegetable oil. Low-fat, reduced-fat, or light mayonnaise and salad dressings (reduced-sodium). Canola, safflower, olive, soybean, and sunflower oils. Avocado. Seasoning and other foods Herbs. Spices. Seasoning mixes without salt. Unsalted popcorn and pretzels. Fat-free sweets. What foods are not recommended? The items listed may not be a complete list. Talk with your dietitian about what dietary choices are best for you. Grains Baked goods made with fat, such as croissants, muffins, or some breads. Dry pasta or rice meal packs. Vegetables Creamed or fried vegetables. Vegetables in a cheese sauce. Regular canned vegetables (not low-sodium or reduced-sodium). Regular canned tomato sauce and paste (not low-sodium or reduced-sodium). Regular  tomato and vegetable juice (not low-sodium or reduced-sodium). Angie Fava. Olives. Fruits Canned fruit in a light or heavy syrup. Fried fruit. Fruit in cream or butter sauce. Meat and other protein foods Fatty cuts of meat. Ribs. Fried meat. Berniece Salines. Sausage. Bologna and other processed lunch meats. Salami. Fatback. Hotdogs. Bratwurst. Salted nuts and seeds. Canned beans with added salt. Canned or smoked fish. Whole eggs or egg yolks. Chicken or [place] with skin. Dairy Whole or 2% milk, cream, and half-and-half. Whole or full-fat cream cheese. Whole-fat or sweetened yogurt. Full-fat cheese. Nondairy creamers. Whipped toppings. Processed cheese and cheese spreads. Fats and oils Butter. Stick margarine. Lard. Shortening. Ghee. Bacon fat. Tropical oils, such as coconut, palm kernel, or palm oil. Seasoning and other foods Salted popcorn and pretzels. Onion salt, garlic salt, seasoned salt, table salt, and sea salt. Worcestershire sauce. Tartar sauce. Barbecue sauce. Teriyaki sauce. Soy sauce, including reduced-sodium. Steak sauce. Canned and packaged gravies. Fish sauce. Oyster sauce. Cocktail sauce. Horseradish that you find on the shelf. Ketchup. Mustard. Meat flavorings and tenderizers. Bouillon cubes. Hot sauce and Tabasco sauce. Premade or packaged marinades. Premade or packaged taco seasonings. Relishes. Regular salad dressings. Where to find more information:  National Heart, Lung, and [place]: https://wilson-eaton.com/  American Heart Association: www.heart.org Summary  The DASH eating plan is a healthy eating plan that has  been shown to reduce high blood pressure (hypertension). It may also reduce your risk for type 2 diabetes, heart disease, and stroke.  With the DASH eating plan, you should limit salt (sodium) intake to 2,300 mg a day. If you have hypertension, you may need to reduce your sodium intake to 1,500 mg a day.  When on the DASH eating plan, aim to eat more fresh fruits and  vegetables, whole grains, lean proteins, low-fat dairy, and heart-healthy fats.  Work with your health care provider or diet and nutrition specialist (dietitian) to adjust your eating plan to your individual calorie needs. This information is not intended to replace advice given to you by your health care provider. Make sure you discuss any questions you have with your health care provider. Document Released: 12/29/2010 Document Revised: 12/22/2016 Document Reviewed: 01/03/2016 Elsevier Patient Education  2020 [employer].

## 2020-05-26 NOTE — Progress Notes (Signed)
Advanced Hypertension Clinic Initial Assessment:    Date:  05/26/2020   ID:  Whitney [name], DOB 1985/07/24, MRN [id number]  PCP:  Nicolette Bang, DO  Cardiologist:  None  Nephrologist:  Referring MD: Caryl Never*   CC: Hypertension  History of Present Illness:    Whitney [name] is a 35 y.o. female with a hx of with a hx of hypertension, diabetes, and morbid obesity, here to establish care in the hypertension clinic. She had surgery for an ovarian cyst and uterine bleeding on 05/11/20. Her blood pressure in the OR was 196/35, so she was referred to advanced hypertension clinic. She previously has an echo 03/2020 that revealed LVEF 60-65% with mild LVH and was otherwise normal.  Today, she reports having hypertension for a while, but did not become concerning until 2020 when she had a PE.  This occurred shortly after she returned from a long car ride. She was on Xarelto for 6 months, was discontinued, and had a recurrent PE, so she is on lifelong anticoagulation. She recently started a new less stressful job. Her previous job was stressful, answering phones and emails. She wanted to begin exercising earlier this year as a new year's resolution, but since developing the ovarian cyst she was fearful of exercising. When she does exercise she has no exertional symptoms. She does have LE edema mostly around her ankles, that usually improves by the morning. She does snore. At the end of 2020 she was having insomnia, and a home sleep test found mild sleep apnea. However, she notes having unusual difficulty trying to fall asleep during the test. If she goes to sleep at a decent hour she will feel rested, but she sometimes struggles with this.  She sometimes drinks several energy drinks to stay awake but not recently.  She is monitoring her diet well, eating more salads, and avoiding fast food this past month.  Her 96 year old son was also diagnosed with HTN recently so she is working on  lifestyle changes.   Currently not using any supplements or herbs, although she did try apple cider vinegar inconsistently. She will drink alcohol about once or twice a month. She denies any heart racing/palpitations or flushing. She notes not eating as much during the day due to work, and occasionally will have headaches. She does check her blood pressure at home, and it is normally 170-190/90. When she has pain, she will go to sleep rather than take any medications, except for severe pain. Taking medications can be difficult as the taste makes her nauseous and she may avoid taking them. Typically, she will crush her medications and mix them with food or drink (applesauce, flavored drinks) to help mask the taste. Also, she is stressed over recent events and health concerns, and is unsure if this contributes to her symptoms. She usually talks with family and friends for support and is the main caretaker of her son. She was first diagnosed with diabetes on 12/2015. She believes her maternal grandmother had hypertension and diabetes. Otherwise she denies any known family history of heart disease. She denies any chest pain, shortness of breath, or syncope.    Previous antihypertensives: Amlodipine Carvedilol HCTZ Irbesartan  Losartan (all discontinued to consolidate regimen)   Past Medical History:  Diagnosis Date  . Diabetes mellitus without complication ([place])    type 2  . DVT (deep venous thrombosis) ([place]) 03/2018  . Gallstones   . Headache(784.0)   . Hypertension   .  OSA (obstructive sleep apnea) 05/26/2020  . Sleep apnea    does not need cpap    Past Surgical History:  Procedure Laterality Date  . CHOLECYSTECTOMY N/A 03/12/2012   Procedure: LAPAROSCOPIC CHOLECYSTECTOMY;  Surgeon: Harl Bowie, MD;  Location: [facility];  Service: General;  Laterality: N/A;  . HYSTEROSCOPY WITH D & C N/A 05/11/2020   Procedure: DILATATION AND CURETTAGE /HYSTEROSCOPY;  Surgeon: Sanjuana Kava, MD;   Location: [facility];  Service: Gynecology;  Laterality: N/A;  . LAPAROSCOPY N/A 05/11/2020   Procedure: LAPAROSCOPY OPERATIVE; PELVIC WASHINGS;  Surgeon: Sanjuana Kava, MD;  Location: [facility];  Service: Gynecology;  Laterality: N/A;  OVARIAN CYSTECTOMY    Current Medications: Current Meds  Medication Sig  . Cephalexin 500 MG tablet cephalexin 500 mg tablet  Take 1 tablet every 6 hours by oral route for 7 days.  . dapagliflozin propanediol (FARXIGA) 10 MG TABS tablet Take 10 mg by mouth daily before breakfast.  . glucose blood (ONETOUCH VERIO) test strip 1 each by Other route 2 (two) times daily. And lancets 2/day  . Insulin Glargine (LANTUS SOLOSTAR) 100 UNIT/ML Solostar Pen Inject 15 Units into the skin daily.  . Lancets (ONETOUCH DELICA PLUS [id number]) [place] 1 each by Other route as directed.  . metFORMIN (GLUCOPHAGE-XR) 500 MG 24 hr tablet Take 2 tablets (1,000 mg total) by mouth daily with breakfast.  . Multiple Vitamins-Minerals (HAIR/SKIN/NAILS) TABS Take 1 tablet by mouth daily with breakfast.  . Olmesartan-amLODIPine-HCTZ 20-5-12.5 MG TABS Take 1 tablet by mouth daily.  . rivaroxaban (XARELTO) 10 MG TABS tablet Take 1 tablet (10 mg total) by mouth daily.  . [DISCONTINUED] amLODipine (NORVASC) 10 MG tablet Take 1 tablet (10 mg total) by mouth daily. To lower blood sugar  . [DISCONTINUED] carvedilol (COREG) 12.5 MG tablet Take 1 tablet (12.5 mg total) by mouth 2 (two) times daily with a meal.  . [DISCONTINUED] hydrochlorothiazide (HYDRODIURIL) 25 MG tablet Take 1 tablet (25 mg total) by mouth daily.  . [DISCONTINUED] irbesartan (AVAPRO) 300 MG tablet Take 1 tablet (300 mg total) by mouth daily.     Allergies:   Patient has no known allergies.   Social History   Socioeconomic History  . Marital status: Single    Spouse name: Not on file  . Number of children: 1  . Years of education: 72  . Highest education level: Not on file  Occupational History  . Not on file  Tobacco Use  .  Smoking status: Former Smoker    Types: Cigarettes    Quit date: 05/24/2011    Years since quitting: 9.0  . Smokeless tobacco: Never Used  Vaping Use  . Vaping Use: Never used  Substance and Sexual Activity  . Alcohol use: Yes    Comment: Once a month on average  . Drug use: No  . Sexual activity: Not Currently    Birth control/protection: None  Other Topics Concern  . Not on file  Social History Narrative   Fun: Read, hang out with her son.    Denies abuse and feels safe at home.    Social Determinants of Health   Financial Resource Strain: Low Risk   . Difficulty of Paying Living Expenses: Not hard at all  Food Insecurity: No Food Insecurity  . Worried About Charity fundraiser in the Last Year: Never true  . Ran Out of Food in the Last Year: Never true  Transportation Needs: No Transportation Needs  . Lack of Transportation (Medical): No  .  Lack of Transportation (Non-Medical): No  Physical Activity: Inactive  . Days of Exercise per Week: 0 days  . Minutes of Exercise per Session: 0 min  Stress: Stress Concern Present  . Feeling of Stress : To some extent  Social Connections: Not on file     Family History: The patient's family history includes Benign prostatic hyperplasia in her maternal grandfather; Brain cancer in her paternal grandmother; Breast cancer in her maternal grandmother; Diabetes in her maternal grandmother; Healthy in her mother; Hypertension in her maternal grandmother; Hypertension (age of onset: 46) in her son; Lung cancer in her paternal grandfather; Syncope episode in her maternal grandfather; Ulcerative colitis in her father. There is no history of Anesthesia problems, Hypotension, Malignant hyperthermia, Pseudochol deficiency, Colon cancer, Esophageal cancer, or Rectal cancer.  ROS:   Please see the history of present illness.     All other systems reviewed and are negative.  EKGs/Labs/Other Studies Reviewed:    EKG:  EKG is not ordered today.     Recent Labs: 04/22/2020: ALT 52 05/11/2020: BUN 10; Creatinine, Ser 0.96; Hemoglobin 12.9; Platelets 264; Potassium 3.4; Sodium 136   Recent Lipid Panel    Component Value Date/Time   CHOL 137 05/22/2018 0920   TRIG 112 05/22/2018 0920   HDL 42 05/22/2018 0920   CHOLHDL 3.3 05/22/2018 0920   CHOLHDL 3 12/27/2015 1014   VLDL 13.2 12/27/2015 1014   LDLCALC 73 05/22/2018 0920    Physical Exam:   VS:  BP (!) 192/140   Pulse 86   Ht 5\' 11"  (1.803 m)   Wt (!) 371 lb 6.4 oz (168.5 kg)   LMP 05/09/2020   SpO2 94%   BMI 51.80 kg/m  , BMI Body mass index is 51.8 kg/m. GENERAL:  Well appearing HEENT: Pupils equal round and reactive, fundi not visualized, oral mucosa unremarkable NECK:  No jugular venous distention, waveform within normal limits, carotid upstroke brisk and symmetric, no bruits LUNGS:  Clear to auscultation bilaterally HEART:  RRR.  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  Will get her a co-pay card.  OSA (obstructive sleep apnea) Mild OSA on prior sleep study.  She reported struggling with wearing advice because it was hard to sleep electrodiagnostic equipment:.  We discussed focusing on diet and exercise with weight loss for now.  Morbid obesity ([place]) Referral to [facility]P.  Increasing exercise weekly.  Will consider referral to healthy weight and wellness after she completes her time with  [facility].  Essential hypertension Blood pressure is poorly controlled despite being prescribed multiple medications.  She is struggled with compliance and taking multiple medications at current time.  I think she would do better with eating poorly.  We will prescribe Tribenzor 20/5/12.5 mg daily.  She will have a basic metabolic panel checked in 1 week.  We will also check her thyroid at that time.  She is interested in enrolling in our remote patient monitoring program  through [employer].  She gives her consent and was trained on the device today.  Seems that stress is also contributing.  We will have her see Dr. Michail Sermon to discuss stress and disease management coping skills.  She had a CT scan earlier this year that revealed patent renal arteries and no adrenal adenomas.  We will work on diet and exercise.  She received the hypertension clinic booklet and was instructed to limit her sodium to 1500 mg daily.   Screening for Secondary Hypertension:  Causes 05/26/2020  Drugs/Herbals Screened     - Comments caffeine- Mt. Dew, 5 Hour energy  Renovascular HTN Screened     - Comments CT-A negative 2022  Sleep Apnea Screened     - Comments Mild OSA. Focused on diet/exercise and weight loss for now.  Hyperthyroidism Screened     - Comments check TSH  Hypothyroidism Screened     - Comments Check TSH  Hyperaldosteronism Screened     - Comments No adrenal adenomas on CT-A 01/2020  Pheochromocytoma Screened     - Comments No adrenal adenomas on CT-A 01/2020  Cushing's Syndrome N/A  Hyperparathyroidism N/A  Coarctation of the Aorta Screened     - Comments BP symmetric  Compliance Screened     - Comments Patient non-compliant.  Called pharmacy and several meds had not been picked up this year.  She struggles with taking them all and taking them at the same time.    Relevant Labs/Studies: Basic Labs Latest Ref Rng & Units 05/11/2020 04/22/2020 02/16/2020  Sodium 135 - 145 mmol/L 136 139 135  Potassium 3.5 - 5.1  mmol/L 3.4(L) 3.7 3.4(L)  Creatinine 0.44 - 1.00 mg/dL 0.96 0.99 0.86    Thyroid  Latest Ref Rng & Units 04/14/2018 12/27/2015  TSH 0.350 - 4.500 uIU/mL 1.993 1.35                   Disposition:    FU with MD/PharmD in 2 month    Medication Adjustments/Labs and Tests Ordered: Current medicines are reviewed at length with the patient today.  Concerns regarding medicines are outlined above.  Orders Placed This Encounter  Procedures  . Basic metabolic panel   Meds ordered this encounter  Medications  . Olmesartan-amLODIPine-HCTZ 20-5-12.5 MG TABS    Sig: Take 1 tablet by mouth daily.    Dispense:  90 tablet    Refill:  1    D/C CARVEDILOL, AMLODIPINE, LOSARTAN, IRBESARTAN AND HCTZ RX'S   I,Mathew Stumpf,acting as a scribe for Skeet Latch, MD.,have documented all relevant documentation on the behalf of Skeet Latch, MD,as directed by  Skeet Latch, MD while in the presence of Skeet Latch, MD.  I, [place] Linsey, MD have reviewed all documentation for this visit.  The documentation of the exam, diagnosis, procedures, and orders on 05/26/2020 are all accurate and complete.  Signed, Skeet Latch, MD  05/26/2020 10:42 AM    [facility]

## 2020-05-26 NOTE — Assessment & Plan Note (Signed)
Blood pressure is poorly controlled despite being prescribed multiple medications.  She is struggled with compliance and taking multiple medications at current time.  I think she would do better with eating poorly.  We will prescribe Tribenzor 20/5/12.5 mg daily.  She will have a basic metabolic panel checked in 1 week.  We will also check her thyroid at that time.  She is interested in enrolling in our remote patient monitoring program through [employer].  She gives her consent and was trained on the device today.  Seems that stress is also contributing.  We will have her see Dr. Michail Sermon to discuss stress and disease management coping skills.  She had a CT scan earlier this year that revealed patent renal arteries and no adrenal adenomas.  We will work on diet and exercise.  She received the hypertension clinic booklet and was instructed to limit her sodium to 1500 mg daily.

## 2020-05-26 NOTE — Assessment & Plan Note (Addendum)
Recurrent DVT/PE.  On lifelong anticoagulation with Xarelto.  Will get her a co-pay card.

## 2020-05-26 NOTE — Assessment & Plan Note (Signed)
Referral to PREP.  Increasing exercise weekly.  Will consider referral to healthy weight and wellness after she completes her time with PREP.

## 2020-05-26 NOTE — Telephone Encounter (Signed)
Called patient to check on her because her blood pressure reading in [place] was 176/121. Asked patient if they had taken their medication. Patient stated that she took her medications when she got back home from the doctor around 11:30am. Patient took her BP at 12:18pm. Patient was informed that Care Guide will be keeping check on BP readings and will call to make sure everything is okay and that she is taking her medications.  Based on survey questions in [place], patient was asked if she was interested in working on increasing her physical activity and/or improving healthy eating behaviors. Patient is interested in health coaching to increase physical activity. Patient has been scheduled for an in-person initial health coaching session on May 31, 2020 at 11:30am.

## 2020-05-28 ENCOUNTER — Telehealth: Payer: Self-pay

## 2020-05-28 NOTE — Telephone Encounter (Signed)
Lvmt pt requesting call back to discuss PREP

## 2020-05-31 ENCOUNTER — Ambulatory Visit: Payer: 59

## 2020-06-03 ENCOUNTER — Telehealth: Payer: Self-pay | Admitting: Pharmacist Clinician (PhC)/ Clinical Pharmacy Specialist

## 2020-06-03 NOTE — Telephone Encounter (Signed)
BP readings in [place] still quite elevated.  Will continue trying to reach out to patient to determine if problems with compliance or medication access.

## 2020-06-08 NOTE — Telephone Encounter (Signed)
Patient still noting high blood pressure readings, has not been able to get metabolic panel drawn yet, will try to get that done tomorrow.  If labs stable will need to consider increasing olmesartan from 20 to 40 mg.

## 2020-06-09 ENCOUNTER — Telehealth: Payer: Self-pay

## 2020-06-09 DIAGNOSIS — Z0000 Encounter for general adult medical examination without abnormal findings: Secondary | ICD-10-CM

## 2020-06-09 NOTE — Telephone Encounter (Signed)
Called patient to reschedule initial health coaching session. Patient has been scheduled for 06/18/20 at 9:30am for an in-person session.

## 2020-06-18 ENCOUNTER — Ambulatory Visit (INDEPENDENT_AMBULATORY_CARE_PROVIDER_SITE_OTHER): Payer: 59

## 2020-06-18 ENCOUNTER — Other Ambulatory Visit: Payer: Self-pay

## 2020-06-18 DIAGNOSIS — Z0000 Encounter for general adult medical examination without abnormal findings: Secondary | ICD-10-CM

## 2020-06-18 LAB — TSH: TSH: 1.19 u[IU]/mL (ref 0.450–4.500)

## 2020-06-18 LAB — BASIC METABOLIC PANEL WITH GFR
BUN/Creatinine Ratio: 10 (ref 9–23)
BUN: 9 mg/dL (ref 6–20)
CO2: 24 mmol/L (ref 20–29)
Calcium: 9.6 mg/dL (ref 8.7–10.2)
Chloride: 99 mmol/L (ref 96–106)
Creatinine, Ser: 0.91 mg/dL (ref 0.57–1.00)
Glucose: 369 mg/dL — ABNORMAL HIGH (ref 65–99)
Potassium: 3.9 mmol/L (ref 3.5–5.2)
Sodium: 137 mmol/L (ref 134–144)
eGFR: 84 mL/min/1.73 (ref >59)

## 2020-06-18 NOTE — Progress Notes (Signed)
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 Patient stated that she is not feeling any pain at this time. Patient stated that she is ready to jump back in but wants to be feasible with the number of responsibilities that she currently have. 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 Patient is trying to figure out how to implement physical activity into her day and start working on improving her eating habits.  Patient stated that Winifred Olive from [facility] had called her and left a message. Patient expressed that she would be calling her back today regarding the program. Patient was interested in walking twice a week in the [facility], but her schedule will not permit that at a decent time in the day. 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 She mentioned that yesterday she walked for approximately 1.5 hours and 45 minutes on Tuesday. Patient stated that this is reoccurring if the weather permits and there are no scheduled events.   Patient mentioned that she typically picks her son up from school around 6:30pm. Patient stated that after getting off work at [employer], she can go exercise. Patient is also interested in attending Zumba classes. Patient stated that she was able to lose weight with  this exercising twice previously. Patient also feels that if she were to get out the house at least 3 times a week that it will help her mentally. Patient stated that she would go somewhere like a coffee house or [employer].   Patient is interested in not eating after 8-8:30pm nightly. Patient also would like to restart meal prepping as she did before with her smoothies and meals. Patient stated that she would prep her smoothies the night before. Patient stated that she typically would have a smoothie for breakfast with a boiled egg, smoothie for lunch, and a salad or soup for dinner. Patient meal prepped every day. Currently, patient is normally running errands or taking a nap during lunch time.    Coaching Outcomes: Patient will start exercising twice a week between 5:15pm - 6:15pm at the gym in addition to the walking that occurs on the job.  Patient plans to figure out Zumba class schedule to see if she will be able to attend.   Patient plans to write out her schedule this weekend to begin setting aside dedicated times for physical activity, meal prepping, and other tasks that she must accomplish during the day. Patient will aim to not eat past 8-8:30pm nightly.   Care Guide provided the patient with a [facility] and Recreation booklet with activities that she  can see if she is interested in engaging in any to increase her physical activity. Patient was provided daily planner sheets to help her write out her daily schedule for the next two weeks to help her being to include exercise and meal prepping into her schedule. Patient was also given weekly tracker sheets to monitor her progress, challenges, strengths/resources over the next two weeks as she works towards her goals. Patient will bring these materials back to next visit for review.  Patient was provided with a signed copy of the Health Coaching agreement and Code of Ethics. Patient has been scheduled for her two week follow  up.

## 2020-06-24 ENCOUNTER — Telehealth: Payer: Self-pay

## 2020-06-24 DIAGNOSIS — Z0000 Encounter for general adult medical examination without abnormal findings: Secondary | ICD-10-CM

## 2020-06-24 NOTE — Telephone Encounter (Signed)
Called patient to discuss blood pressure readings from [place] and to offer support around health coaching goals that the patient indicated she was not able to work towards. Left patient a message to return call to Care Guide at [phone].

## 2020-06-28 ENCOUNTER — Telehealth: Payer: Self-pay

## 2020-06-28 ENCOUNTER — Telehealth: Payer: Self-pay | Admitting: Pharmacist Clinician (PhC)/ Clinical Pharmacy Specialist

## 2020-06-28 DIAGNOSIS — Z0000 Encounter for general adult medical examination without abnormal findings: Secondary | ICD-10-CM

## 2020-06-28 MED ORDER — OLMESARTAN-AMLODIPINE-HCTZ 40-5-12.5 MG PO TABS: 1.0000 | ORAL_TABLET | Freq: Every day | ORAL | 6 refills | Status: DC

## 2020-06-28 NOTE — Telephone Encounter (Signed)
LMOM for patient to return call.  Will also send a message thru [place].  Will increase her Tribenzor to 40/5/12.5 mg.   Asked that patient return message to be sure she is aware of the dose change.

## 2020-06-28 NOTE — Telephone Encounter (Signed)
-----   Message from Whitney [name] sent at 06/28/2020 12:57 PM EDT ----- Regarding: [employer] - elevated reading Hi team,  This patient continues to have an elevated bp reading. Today's reading was 179/129. I tried to contact the patient this morning to ensure that she is doing well, if she took her meds before checking her bp, etc. I called the patient last week as well, but she has yet to return my call.  Best, Amy

## 2020-06-28 NOTE — Telephone Encounter (Signed)
Called patient to discuss their blood pressure reading for today at 179/129 to determine if she was okay and what next steps need to be taken. Left patient a message to return call to Care guide asap. Provider has been informed of patient's reading as well.

## 2020-07-02 ENCOUNTER — Ambulatory Visit (INDEPENDENT_AMBULATORY_CARE_PROVIDER_SITE_OTHER): Payer: Self-pay

## 2020-07-02 ENCOUNTER — Other Ambulatory Visit: Payer: Self-pay

## 2020-07-02 DIAGNOSIS — Z0000 Encounter for general adult medical examination without abnormal findings: Secondary | ICD-10-CM

## 2020-07-02 NOTE — Progress Notes (Signed)
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 Patient was able to successfully write to-do-lists to help organize her tasks for the day. Patient stated that she created a list for work, her business, and personal life. Patient stated that she can now keep track of everything, because at first it was too many tasks. Patient shared that she has a large calendar on her wall for appointments as well. Patient mentioned that starting today, she was going to put tasks in her phone to do for the extra reminder.  Patient stated that she was not able to work out in the gym twice a week as planned. 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 Patient did investigate the schedule for the Zumba class that her friend teaches but need to check her schedule to see if the time will fit. Patient plans to have her mother watch her son, while she works out. Patient shared that she does not look at [facility] as a workout. Patient stated that either she will work out in the gym or engage in the classes. Patient stated that ultimately, she wants to be back in the gym regularly like before. Patient stated that she has  looked at the [facility] and Recreation booklet to determine if there are any free activities that her and her son can engage in together, or she can engage in as physical activity. 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   Patient expressed that she is expressed a good stress from work. Patient stated that she was aware of the homeless population before, but to be working with those that may become homeless has been an eye opening and emotional. Patient stated that she doesn't mind this type of stress because she wants to help people, connect them to resources, but at the same time it tugs on her heart strings. 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 Patient stated that she is most creative at night and her orders have been picking up. Patient mentioned that sometimes, she intertwines work and her business tasks together rather than keeping them separate.   Patient mentioned that she did not work drinking smoothies because her greens (spinach) went bad before she could use it. Patient stated that green shouldn't represent money. However, patient still has the fruit that was purchased for the smoothies. Patient has not been able to eat every night by  8:30pm. Patient stated that sometimes, this may be her one and only meal of the day. If patient has lunch around 3-4pm, then dinner is around 9-10pm.  Patient stated that prior to health coaching, she was trying to keep everything in her head, but writing things down has helped her because she is a visual person. Patient believes that blood has been high because of the stress. Patient shared that she is currently going to bed around 12 midnight and would like to do better. Patient stated that she normally plays a game before going to bed as a wind down but feels guilty because she should be using that time to sleep. Patient would like to try a new  approach to managing her time/stress.    Indicators of Success and Accountability:  Patient stated that she is being more conscious of time management and trying to be realistic with the number of responsibilities that she currently has.  Readiness: Patient is in the action phase of increasing physical activity and improving healthy eating habits.  Strengths and Supports: Patient stated that an increase in self-awareness has been her strength. Patient is being supported by her mother.  Challenges and Barriers: Patient having multiple responsibilities and managing time to be productive may present a challenge to implementing action steps.   Coaching Outcomes: Patient expressed that she has learned that she needs to shut it down at a decent time. Patient will be working towards stress management by trying to implement a wind down time prior to going to bed at 11pm nightly.   Patient stated that she has recognized the need to prioritize and learned that she needs to write down things for other aspects of her life as well. Patient also mentioned that she is trying not to get frustrated by not comparing her current self to previous versions of herself. Patient is focused on working to created balance in her life.   Patient will be putting a schedule together on Sunday to maintain household chores. The idea is to not be cleaning everything on weekends and to disperse cleaning throughout the week. Patient shared that she has Googled ideas from [place] to aid in breaking down tasks/chores.   Patient plans to grocery shop on Sunday and will prepare spinach early for smoothie prepping in containers that will last approximately 2-3 days. New agreement per patient has been set for the next two weeks.   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 Patient attempted to write out her daily schedule by the hour, but ultimately made to-do-lists instead Not met - Patient was not able  to exercise in the gym twice/week. Patient tired to meal prep smoothies, but it didn't work out as she had planned.

## 2020-07-16 ENCOUNTER — Telehealth: Payer: Self-pay

## 2020-07-16 DIAGNOSIS — Z0000 Encounter for general adult medical examination without abnormal findings: Secondary | ICD-10-CM

## 2020-07-16 NOTE — Telephone Encounter (Signed)
Called patient to discuss if she was having any connectivity issues with [employer]. Left patient a message to return call to Care Guide at [phone].

## 2020-07-19 ENCOUNTER — Ambulatory Visit: Payer: Self-pay

## 2020-07-19 ENCOUNTER — Telehealth: Payer: Self-pay

## 2020-07-19 DIAGNOSIS — Z0000 Encounter for general adult medical examination without abnormal findings: Secondary | ICD-10-CM

## 2020-07-19 NOTE — Telephone Encounter (Signed)
Patient called in to reschedule appointment due to lack of childcare today. Patient has been rescheduled for an in-person session on July 1 at 12pm.

## 2020-07-22 ENCOUNTER — Telehealth: Payer: Self-pay

## 2020-07-22 DIAGNOSIS — Z0000 Encounter for general adult medical examination without abnormal findings: Secondary | ICD-10-CM

## 2020-07-22 NOTE — Telephone Encounter (Signed)
Returned patient's call regarding confusion on having two appointments. Patient was informed that she has a pharmacy appointment on 7-5 at 10am in addition to the health coaching appointment on 7-1 at 12pm. Patient requested to reschedule health coaching appointment to same day as appointment with pharmacy. Patient has been rescheduled for an in-person session on 7-5 with Care Guide at 9:30am.

## 2020-07-23 ENCOUNTER — Telehealth: Payer: Self-pay

## 2020-07-23 ENCOUNTER — Ambulatory Visit: Payer: Self-pay

## 2020-07-23 DIAGNOSIS — Z0000 Encounter for general adult medical examination without abnormal findings: Secondary | ICD-10-CM

## 2020-07-23 NOTE — Telephone Encounter (Signed)
Patient called to reschedule health coaching session because of work schedule conflict. Patient has been rescheduled for the same day, 7/5 at 4:40pm. Patient would also like to reschedule her appointment with pharmacy the same day if possible. Care Guide will message pharmacy regarding patient's request.

## 2020-07-27 ENCOUNTER — Ambulatory Visit: Payer: Self-pay

## 2020-07-27 ENCOUNTER — Other Ambulatory Visit: Payer: Self-pay

## 2020-07-27 ENCOUNTER — Ambulatory Visit (INDEPENDENT_AMBULATORY_CARE_PROVIDER_SITE_OTHER): Payer: Self-pay | Admitting: Pharmacist Clinician (PhC)/ Clinical Pharmacy Specialist

## 2020-07-27 DIAGNOSIS — I10 Essential (primary) hypertension: Secondary | ICD-10-CM

## 2020-07-27 MED ORDER — CHLORTHALIDONE 25 MG PO TABS: 25.0000 mg | ORAL_TABLET | Freq: Every day | ORAL | 6 refills | Status: DC

## 2020-07-27 MED ORDER — AMLODIPINE-OLMESARTAN 5-40 MG PO TABS: 1.0000 | ORAL_TABLET | Freq: Every day | ORAL | 6 refills | Status: DC

## 2020-07-27 NOTE — Patient Instructions (Signed)
Return for a a follow up appointment July 27 at 3 pm  Check your blood pressure at home daily and keep record of the readings.  Take your BP meds as follows:  Start chorthalidone 12.5 mg (1/2 tablet) once daily with your olmesartan/amlodipine/hctz.  When you run out of the triple tablet, start olmesartan/amlodipine 40/5 mg and increase the chlorthalidone to 1 tablet  daily.  All medications okay to be crushed.  Bring all of your meds, your BP cuff and your record of home blood pressures to your next appointment.  Exercise as you're able, try to walk approximately 30 minutes per day.  Keep salt intake to a minimum, especially watch canned and prepared boxed foods.  Eat more fresh fruits and vegetables and fewer canned items.  Avoid eating in fast food restaurants.    HOW TO TAKE YOUR BLOOD PRESSURE: Rest 5 minutes before taking your blood pressure.  Don't smoke or drink caffeinated beverages for at least 30 minutes before. Take your blood pressure before (not after) you eat. Sit comfortably with your back supported and both feet on the floor (don't cross your legs). Elevate your arm to heart level on a table or a desk. Use the proper sized cuff. It should fit smoothly and snugly around your bare upper arm. There should be enough room to slip a fingertip under the cuff. The bottom edge of the cuff should be 1 inch above the crease of the elbow. Ideally, take 3 measurements at one sitting and record the average.

## 2020-07-27 NOTE — Progress Notes (Signed)
07/28/2020 Whitney [name] [birth year] [id number]   HPI:  Whitney [name] is a 35 y.o. female patient of Dr Oval Linsey, with a PMH below who presents today for advanced hypertension clinic management.  She was seen by Dr. Oval Linsey two months ago, at which time her pressure was elevated to 192/140.  She noted having hypertension for the past 2-3 years, and has difficulty with swallowing tablets.  She has been crushing them into yogurt in order to get them down.  Admits to not being compliant with taking the olmesartan/amlodipine/hctz, noting that she gets it in about 70% of the time, but not always at the same time on the days she does take.    Past Medical History: DVT/PE Patient notes first episode DVT/PE, second was just DVT  DM2 3/21 A1c 9.3  OSA Mild, not treating   Blood Pressure Goal:  130/80  Current Medications:  olmesartan/amlodipine/hctz 40-5-12.5  Family Hx: mgm hypertension; father has GI issues; mother healthy; not sure about either sibling; 1 son 67 yrs old  Social Hx: no tobacco, occasional alcohol (< monthly), has been cutting back on caffeine over past few weeks; still some Mt. Dew or 5-hour energy drinks  Diet: started green smoothie cleanse this morning.  Will to in place of meals tid x 2 weeks, then twice daily with 1 meal thereafter.  Eats snacks between - tuna or peanut butter on crackers, boiled egg, other protein rich snacks.    Exercise: also started today - plan is for 30 min of cardio and weights with 15 minutes in sauna afterward  Home BP readings: states has been taking daily, nothing in [place].  Her phone app states there was an error and data not available.  Will forward to [place] to reach out to her Last 6 readings from the end of May to early June showed average of 173/118  Intolerances: nkda  Labs: 5/22: Na 137, K 3.9, Glu 369, BUN 9, SCr 0.91, GFR 84   Wt Readings from Last 3 Encounters:  07/27/20 (!) 383 lb 6.4 oz (173.9 kg)  05/26/20 (!) 371 lb 6.4  oz (168.5 kg)  05/11/20 (!) 364 lb 9.6 oz (165.4 kg)   BP Readings from Last 3 Encounters:  07/27/20 (!) 200/130  05/26/20 (!) 192/140  05/11/20 (!) 177/121   Pulse Readings from Last 3 Encounters:  07/27/20 96  05/26/20 86  05/11/20 90    Current Outpatient Medications  Medication Sig Dispense Refill   amLODipine-olmesartan (AZOR) 5-40 MG tablet Take 1 tablet by mouth daily. 30 tablet 6   Biotin 10000 MCG TABS Take by mouth.     chlorthalidone (HYGROTON) 25 MG tablet Take 1 tablet (25 mg total) by mouth daily. 30 tablet 6   dapagliflozin propanediol (FARXIGA) 10 MG TABS tablet Take 10 mg by mouth daily before breakfast. 30 tablet 11   glucose blood (ONETOUCH VERIO) test strip 1 each by Other route 2 (two) times daily. And lancets 2/day 100 each 12   Insulin Glargine (LANTUS SOLOSTAR) 100 UNIT/ML Solostar Pen Inject 15 Units into the skin daily. 5 pen 3   Lancets (ONETOUCH DELICA PLUS [id number]) MISC 1 each by Other route as directed.     metFORMIN (GLUCOPHAGE-XR) 500 MG 24 hr tablet Take 2 tablets (1,000 mg total) by mouth daily with breakfast. 180 tablet 3   Multiple Vitamins-Minerals (HAIR/SKIN/NAILS) TABS Take 1 tablet by mouth daily with breakfast.     rivaroxaban (XARELTO) 10 MG TABS tablet Take  1 tablet (10 mg total) by mouth daily. 90 tablet 2   No current facility-administered medications for this visit.    No Known Allergies  Past Medical History:  Diagnosis Date   Diabetes mellitus without complication ([place])    type 2   DVT (deep venous thrombosis) ([place]) 03/2018   Gallstones    Headache(784.0)    Hypertension    OSA (obstructive sleep apnea) 05/26/2020   Sleep apnea    does not need cpap    Blood pressure (!) 200/130, pulse 96, resp. rate 16, height 6' (1.829 m), weight (!) 383 lb 6.4 oz (173.9 kg), last menstrual period 07/27/2020, SpO2 97 %.  Essential hypertension Patient with resistant hypertension, readings unchanged since last visit.  Patient reports  using [facility] app daily, however readings not in system.  When looking at her phone app, it notes almost every day in past week that there was an error/problem with the readings.  She did take it today and downloaded without problem.  Will reach out to [employer] to help her with this.    I don't believe patient understands the potential long term dangers of having such an elevated pressure.  Discussed need to be more aggressive at getting numbers down and stressed that she need to find a routine to taking her medications.  She takes all medications with food to avoid nausea/upset stomach, and this could be a problem in the future, as she will probably benefit most from taking meds twice daily.  She does note some ankle edema, so do not want to increase amlodipine at this time.  Will have her start chlorthalidone 12.5 mg daily in conjunction with her olmesartan/amlodipine/hctz 40/5/12.5.  When she runs out of the triple tablet, she will start olmesartan/amlodipine 40/5 and increase the chlorthalidone to 25 mg daily.     Would like to test for hyperaldosteronism, but need her to be off olmesartan for 3-4 days prior.  Although she is not completely compliant with meds, don't want to purposely hold med with her pressure this elevated.  If there is no significant decrease with the switch to chlorthalidone, may have to just start spironolactone without getting aldosterone/renin labs.    Will see her back in 3 weeks, hopefully she will understand the need for aggressive treatment.     Tommy Medal PharmD CPP [facility] [address] [place], [place] [address] [phone]

## 2020-07-28 ENCOUNTER — Ambulatory Visit (INDEPENDENT_AMBULATORY_CARE_PROVIDER_SITE_OTHER): Payer: Self-pay

## 2020-07-28 ENCOUNTER — Encounter: Payer: Self-pay | Admitting: Pharmacist Clinician (PhC)/ Clinical Pharmacy Specialist

## 2020-07-28 DIAGNOSIS — Z0000 Encounter for general adult medical examination without abnormal findings: Secondary | ICD-10-CM

## 2020-07-28 NOTE — Assessment & Plan Note (Signed)
Patient with resistant hypertension, readings unchanged since last visit.  Patient reports using [employer] app daily, however readings not in system.  When looking at her phone app, it notes almost every day in past week that there was an error/problem with the readings.  She did take it today and downloaded without problem.  Will reach out to [employer] to help her with this.    I don't believe patient understands the potential long term dangers of having such an elevated pressure.  Discussed need to be more aggressive at getting numbers down and stressed that she need to find a routine to taking her medications.  She takes all medications with food to avoid nausea/upset stomach, and this could be a problem in the future, as she will probably benefit most from taking meds twice daily.  She does note some ankle edema, so do not want to increase amlodipine at this time.  Will have her start chlorthalidone 12.5 mg daily in conjunction with her olmesartan/amlodipine/hctz 40/5/12.5.  When she runs out of the triple tablet, she will start olmesartan/amlodipine 40/5 and increase the chlorthalidone to 25 mg daily.     Would like to test for hyperaldosteronism, but need her to be off olmesartan for 3-4 days prior.  Although she is not completely compliant with meds, don't want to purposely hold med with her pressure this elevated.  If there is no significant decrease with the switch to chlorthalidone, may have to just start spironolactone without getting aldosterone/renin labs.    Will see her back in 3 weeks, hopefully she will understand the need for aggressive treatment.

## 2020-07-28 NOTE — Progress Notes (Signed)
Appointment Outcome:  Completed, Session #: 2 Start time: 4:32pm   End time: 5:12pm   Total Mins: 40 minutes  AGREEMENTS SECTION    Overall Goal(s): Increase physical activity Improving healthy eating habits       Stress management   Agreement/Action Steps Stress management Read bible and pray daily Create daily to-do-list to organize tasks and prioritize health goals Put together Sunday household chores Mother and son time at 74:45 - 7:30pm nightly Implement wind-down time at 10:30 - 10:50 pm daily Bedtime at 11:00 pm nightly    Increase Physical Activity Exercise in gym twice/week from 5:15pm - 6:15pm or engage weekly Zumba classes Drop son off to mother's                Walk while canvassing neighborhoods at work (time will vary) Setting tasks reminders in phone   [place] eat after 8-8:30pm nightly Meal prep smoothies on Sundays    Progress Notes:  Patient stated that she is reading the Bible in the morning and have started a one year reading plan. Patient stated that if she does not finish reading in the morning, then she reads throughout the day. Patient shared that she writes her prayers at night before bed.   Patient shared that she will be moving next month so she made a list of tasks per room. 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 Patient stated that she will tackle one room per weekend to deep clean and pack. Patient has been setting reminders in her phone as well and likes the additional back up to her to-do-list in case she is not home and need a quick reminder.   Patient stated that she started the green smoothie cleanse and does not eat pass 8:30pm because her snack and smoothie keep her full. Patient is drinking about  gallons of water per day and is working to drink 1 gallon per day. Patient mentioned that she meal prep her smoothies at night and not days in advance because she does not  like the texture when it sits for a few days.   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 Patient stated that she knows she needs to make more effort towards dedicating more time. Patient stated that she does include his basketball games as time spent together. Patient stated that maybe on weekends, she can carve out a specific time to do activities with her son, such as a movie or game night.  Patient stated that she downloaded the habit tracker yesterday. She has been tracking her steps and have been averaging 5,000 steps per day during work and is planning to work her way back up. Patient stated that she walks for about an hour a day during work when [employer] neighborhoods. Patient stated this time can vary depending on the weather and the volume of ppl they encounter. Patient stated that she will start exercising in the gym for an hour. Patient shared that since son was with her dad, she would have the free time in the evening to exercise after work.   Patient stated that she wraps up her day at 5:00pm and have set boundaries to not take calls after hours. Patient has been able to implement her wind down time before bed and has found herself playing games on her phone. Patient stated that  she has been going to bed between 11pm and 12am. 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   Indicators of Success and Accountability:  What are the mile markers along your path to reaching your desired changes  Readiness: How ready are you to make the changes you have identified?  Rate your readiness on a 1-5 scale with 5 being the readiest.  Strengths and Supports: Patient is being supported by her family. Patient is organized, intentional, realistic, and has increase self-awareness.  Challenges and Barriers: Patient  stated that getting to the gym may be a challenge when something in her schedule throws her off routine.   Coaching Outcomes: Patient stated that she feels she is starting to hold herself accountable with the steps that she has in place.   Patient expressed that on the days that she cannot make it to the gym, she will use her workout DVDs, weights, and steps at home. Patient stated that she will have push herself to do so because she does not like to workout at home, but she has saved [employer] workout videos to use as well.   Patient shared that she feels better, and things do not feel overwhelming because she has a better handle on things. Patient is aware and stated that she needs to be more consistent to sticking to schedule. Patient feels that doing so will help keep her from feeling overwhelmed.   Patient stated that she is now aware of her workload and recognize the need to stop shoving in more things to do. Patient plans to prioritize 3 top things for each day to tackle because it is more realistic about what she can do.   Patient will continue to follow her schedule and steps as outlined above over the next two weeks except for dividing up household chores. Patient will tackle a room per weekend as currently scheduled and prepping smoothies at night for the next day.    Overall Goal(s): Increase physical activity Improving healthy eating habits       Stress management   Agreement/Action Steps Stress management Read bible daily Write prayers at bedtime Create daily to-do-list to organize tasks and prioritize health goals Mother and son time at 6:45 - 7:30pm nightly Implement wind-down time at 10:30 - 10:50 pm daily Bedtime at 11:00 pm nightly    Increase Physical Activity Exercise in gym twice/week from 5:15pm - 6:15pm or engage weekly Zumba classes Drop son off to mother's                Walk while canvassing neighborhoods at work (time will vary) Deep clean and pack one room  per weekend Setting tasks reminders in phone   [place] eat after 8-8:30pm nightly Meal prep smoothies nightly  Attempted: Fulfilled - Patient has been able to read and pray daily, create daily to-do-list, implement wind down time, walk while canvassing, setting tasks reminders in her phone, not eating after 8:30pm, and meal prepping smoothies.  Partial - Patient has not been able to go to bed every night at 11:00pm. Patient has attempted to implement mother/son time during the evenings, but often conflicted with work.  Not met - Patient did not work out at the gym twice a week for an hour.   Not Attempted: Dropped/Revised- Patient did not create a chore list for the week, instead patient will work on  deep cleaning and packing rooms each weekend.

## 2020-08-04 ENCOUNTER — Telehealth: Payer: Self-pay | Admitting: Pharmacist Clinician (PhC)/ Clinical Pharmacy Specialist

## 2020-08-04 NOTE — Telephone Encounter (Signed)
Reviewed patient Whitney [name] home monitoring data for past week.  While pressure still extremely elevated, is starting to show some improvement.  7 day average 181/122, with yesterday down to 166/116.    LMOM for patient, encouraging her to continue with her weight loss smoothie "cleanse" and daily BP monitoring.  She is now on amlodipine-olmesartan 5-40 and chlorthalidone 25 mg.  Will continue to monitor for another week, as above combination started last week in office.  Would add spironolactone next to see if we can get continued drop in diastolic reading.

## 2020-08-11 ENCOUNTER — Ambulatory Visit (INDEPENDENT_AMBULATORY_CARE_PROVIDER_SITE_OTHER): Payer: Self-pay

## 2020-08-11 ENCOUNTER — Other Ambulatory Visit: Payer: Self-pay

## 2020-08-11 DIAGNOSIS — Z0000 Encounter for general adult medical examination without abnormal findings: Secondary | ICD-10-CM

## 2020-08-11 NOTE — Progress Notes (Signed)
Appointment Outcome:  Completed, Session #: 3 Start time: 4:34pm   End time: 5:06pm   Total Mins: 32 minutes  AGREEMENTS SECTION   Overall Goal(s): Increase physical activity Improving healthy eating habits       Stress management   Agreement/Action Steps Stress management Read bible daily Write prayers at bedtime Create daily to-do-list to organize tasks and prioritize health goals Mother and son time at 6:45 - 7:30pm nightly Implement wind-down time at 10:30 - 10:50 pm daily Bedtime at 11:00 pm nightly    Increase Physical Activity Exercise in gym twice/week from 5:15pm - 6:15pm or engage weekly Zumba classes Drop son off to mother's                Walk while canvassing neighborhoods at work (time will vary) Deep clean and pack one room per weekend Setting tasks reminders in phone   [place] eat after 8-8:30pm nightly Meal prep smoothies nightly  Progress Notes:  Patient stated all she needed to see was a little movement on the scale to get motivated. Patient has been attending a Zumba class on Mondays and Thursdays. Patient stated that the weather has been a challenge to canvassing. However, they have been hosting a lot of events outside and have to set up and break down the tables. Patient   Patient shared that she is using the Bible app on her phone. Patient stated it is her goal to read in the morning and if necessary, she will read throughout the day to ensure that she has finished a passage before going to sleep. Patient continues to write her prayers every night before going to bed as well.   Patient mentioned that she still creates to-do-list to organize the tasks and setting reminders in her phone to help her prioritize her day. Patient shared that she has created lists for each room that she needs to deep clean and pack up. Patient stated that she has already started with the cleaning and packing clothes.   Patient stated that she is  working on implementing mother and son time more frequently. Patient recognized how her behavior choices impacts the behavior and health of her son. Patient shared that she is interested in eating breakfast together and getting up earlier in the morning, so he doesn't feel rushed. Patient stated that her son wants to work out to, and they have planned to start walking around the track at a [employer]. Patient decided to attend Yoga in the [place] with a friend.  Patient's aim is to lose 15 pounds by September 3rd. Patient believes this is very doable with the progress she has already seen. Patient reported that she has lost 12.4 lbs. and 2  inches off her waist.  Patient stated that she is being more conscious about what she is eating and making sure that she does not eat past 8:30, which has been successful with prepping. Patient was meal prepping for her smoothies at night but have recently finished her Green Smoothie Cleanse. Patient expressed that she is going to do the modified version with a smoothie for breakfast and dinner and eat a meal for lunch. Patient stated that she is going to look up different recipes, so she doesn't get bored with continuing to meal plan and prep. Patient is considering joining a 26-day challenge where you share what you eat and water intake with others and your progress for accountability.   Patient has been implementing a wind  down time that include her playing games on her phone and writing her prayers. Patient stated that she is interested in starting her morning routine with yoga/stretching. Patient also listens to Podcast at night while she is either meal prepping or something else as a stress reliever. Patient stated that she is still working on getting to bed consistently by 11pm.    Indicators of Success and Accountability:  Patient stated that getting back motivated to lose weight with healthy eating and being physically active is her indicator of  success and accountability.  Readiness: Patient is in the action stage of improving healthy eating behaviors, increasing physical activity, and managing stress.  Strengths and Supports: Patient is being supported by family and friends. Patient stated that she is organized, motivated, an increase in self-awareness, and having a mindset shift that she can do this.  Challenges and Barriers: Patient's work schedule may interfere with implementing some of her action steps.   Coaching Outcomes: Patient stated that she is feeling more positive and that she can handle things.   Patient will be looking for YouTube videos she can dance to at home for exercise on the days that she can't make it to the gym. Patient stated that her only challenge is the space to dance.   Patient will start walking with her son 2-3x/week.   Patient will start meal planning and prepping for 2 smoothies and lunch each day. Patient will explore some recipes to get started.   Patient stated that the Podcasts helps a lot with motivation. She shared that she feels like her old self again and don't feel like she will be overwhelmed all the time.   Patient will continue to go to Zumba classes on Monday and Thursday.   Patient's ability to canvas neighborhoods is limited when the weather does not permit walking but will be substituted with setting up and breaking down events instead.   Patient will follow the action steps as outlined below over the next two weeks.    Agreement/Action Steps Stress management Read bible daily Create daily to-do-list to organize tasks and prioritize health goals Implement wind-down time at 10:30 - 10:50 pm daily Listen to Podcast or play games on phone and write prayers Bedtime at 11:00 pm nightly    Increase Physical Activity Exercise in gym twice/week from 5:15pm - 6:15pm or engage weekly Zumba classes on        Monday and Thursday Walk while canvassing neighborhoods at work/setting up and  breaking down events Walking with son 2-3x/week on [employer] track Deep clean and pack one room per weekend Setting tasks reminders in phone Utilize support system   [place] eat after 8-8:30pm nightly Meal prep smoothies and lunch nightly Eating breakfast with son daily  Attempted: Fulfilled - Patient is reading the bible daily, creating to-do-list, implementing a wind-down time, attending Zumba, walking while canvassing neighborhoods, deep cleaning and packing one room, setting reminders in her phone, utilizing support system, not eating after 8:30pm, and meal prepping smoothies nightly.  Partial - Patient has not been able to consistently go to bed at 11pm nightly.

## 2020-08-18 ENCOUNTER — Ambulatory Visit: Payer: Self-pay

## 2020-08-18 NOTE — Progress Notes (Deleted)
Patient ID: Whitney [name]                 DOB: 1985/08/15                      MRN: [id number]     HPI: Whitney [name] is a 35 y.o. female referred by Dr. Oval Linsey to HTN clinic. PMH is significant for HTN, PE, DVT, DM, and OSA.  Current HTN meds: amlodipine-olmesartam 5-'40mg'$  daily, chlorthalidone '25mg'$  daily Previously tried:  BP goal:   Family History:   Social History:   Diet:   Exercise:   Home BP readings:   Wt Readings from Last 3 Encounters:  07/27/20 (!) 383 lb 6.4 oz (173.9 kg)  05/26/20 (!) 371 lb 6.4 oz (168.5 kg)  05/11/20 (!) 364 lb 9.6 oz (165.4 kg)   BP Readings from Last 3 Encounters:  07/27/20 (!) 200/130  05/26/20 (!) 192/140  05/11/20 (!) 177/121   Pulse Readings from Last 3 Encounters:  07/27/20 96  05/26/20 86  05/11/20 90    Renal function: CrCl cannot be calculated (Patient's most recent lab result is older than the maximum 21 days allowed.).  Past Medical History:  Diagnosis Date   Diabetes mellitus without complication ([place])    type 2   DVT (deep venous thrombosis) ([place]) 03/2018   Gallstones    Headache(784.0)    Hypertension    OSA (obstructive sleep apnea) 05/26/2020   Sleep apnea    does not need cpap    Current Outpatient Medications on File Prior to Visit  Medication Sig Dispense Refill   amLODipine-olmesartan (AZOR) 5-40 MG tablet Take 1 tablet by mouth daily. 30 tablet 6   Biotin 10000 MCG TABS Take by mouth.     chlorthalidone (HYGROTON) 25 MG tablet Take 1 tablet (25 mg total) by mouth daily. 30 tablet 6   dapagliflozin propanediol (FARXIGA) 10 MG TABS tablet Take 10 mg by mouth daily before breakfast. 30 tablet 11   glucose blood (ONETOUCH VERIO) test strip 1 each by Other route 2 (two) times daily. And lancets 2/day 100 each 12   Insulin Glargine (LANTUS SOLOSTAR) 100 UNIT/ML Solostar Pen Inject 15 Units into the skin daily. 5 pen 3   Lancets (ONETOUCH DELICA PLUS 123XX123) MISC 1 each by Other route as directed.     metFORMIN  (GLUCOPHAGE-XR) 500 MG 24 hr tablet Take 2 tablets (1,000 mg total) by mouth daily with breakfast. 180 tablet 3   Multiple Vitamins-Minerals (HAIR/SKIN/NAILS) TABS Take 1 tablet by mouth daily with breakfast.     rivaroxaban (XARELTO) 10 MG TABS tablet Take 1 tablet (10 mg total) by mouth daily. 90 tablet 2   No current facility-administered medications on file prior to visit.    No Known Allergies   Assessment/Plan:  1. Hypertension -

## 2020-08-27 ENCOUNTER — Telehealth: Payer: Self-pay

## 2020-08-27 ENCOUNTER — Ambulatory Visit: Payer: Self-pay

## 2020-08-27 DIAGNOSIS — Z0000 Encounter for general adult medical examination without abnormal findings: Secondary | ICD-10-CM

## 2020-08-27 NOTE — Telephone Encounter (Signed)
Called patient as scheduled for health coaching appointment via telephone. Left patient a message to return call to hold session before 5pm today or to reschedule with Care Guide at [phone].

## 2020-09-02 ENCOUNTER — Other Ambulatory Visit: Payer: Self-pay

## 2020-09-02 ENCOUNTER — Ambulatory Visit (INDEPENDENT_AMBULATORY_CARE_PROVIDER_SITE_OTHER): Payer: Self-pay | Admitting: Pharmacist Clinician (PhC)/ Clinical Pharmacy Specialist

## 2020-09-02 DIAGNOSIS — I10 Essential (primary) hypertension: Secondary | ICD-10-CM

## 2020-09-02 MED ORDER — CHLORTHALIDONE 25 MG PO TABS: 25.0000 mg | ORAL_TABLET | Freq: Every day | ORAL | 6 refills | Status: DC

## 2020-09-02 MED ORDER — AMLODIPINE-OLMESARTAN 10-40 MG PO TABS: 1.0000 | ORAL_TABLET | Freq: Every day | ORAL | 6 refills | Status: DC

## 2020-09-02 MED ORDER — CHLORTHALIDONE 25 MG PO TABS
25.0000 mg | ORAL_TABLET | Freq: Every day | ORAL | 6 refills | Status: DC
  Filled 2020-09-02: qty 30, 30d supply, fill #0

## 2020-09-02 MED ORDER — SPIRONOLACTONE 25 MG PO TABS: 25.0000 mg | ORAL_TABLET | Freq: Every day | ORAL | 6 refills | Status: DC

## 2020-09-02 MED ORDER — AMLODIPINE-OLMESARTAN 10-40 MG PO TABS
1.0000 | ORAL_TABLET | Freq: Every day | ORAL | 6 refills | Status: DC
  Filled 2020-09-02: qty 30, 30d supply, fill #0

## 2020-09-02 MED ORDER — SPIRONOLACTONE 25 MG PO TABS
25.0000 mg | ORAL_TABLET | Freq: Every day | ORAL | 6 refills | Status: DC
  Filled 2020-09-02: qty 30, 30d supply, fill #0

## 2020-09-02 NOTE — Patient Instructions (Signed)
Return for a a follow up appointment Wednesday August 24 at 2 pm  Repeat lab work at that appointment  Check your blood pressure at home daily and keep record of the readings.  Take your BP meds as follows:  Stop olmesartan/amlodipine/hydrochlorothiazide when you run out (in the next 4-5 days).  After that take olmesartan/amlodipine 40/10 mg once daily. At the same time start chlorthalidone 25 mg once daily  Start spironolactone 25 mg once daily as soon as you pick it up.      Bring all of your meds, your BP cuff and your record of home blood pressures to your next appointment.  Exercise as you're able, try to walk approximately 30 minutes per day.  Keep salt intake to a minimum, especially watch canned and prepared boxed foods.  Eat more fresh fruits and vegetables and fewer canned items.  Avoid eating in fast food restaurants.    HOW TO TAKE YOUR BLOOD PRESSURE: Rest 5 minutes before taking your blood pressure.  Don't smoke or drink caffeinated beverages for at least 30 minutes before. Take your blood pressure before (not after) you eat. Sit comfortably with your back supported and both feet on the floor (don't cross your legs). Elevate your arm to heart level on a table or a desk. Use the proper sized cuff. It should fit smoothly and snugly around your bare upper arm. There should be enough room to slip a fingertip under the cuff. The bottom edge of the cuff should be 1 inch above the crease of the elbow. Ideally, take 3 measurements at one sitting and record the average.

## 2020-09-02 NOTE — Assessment & Plan Note (Signed)
Had frank discussion about the need to get the right medicines and get her pressure under control.  Will have her stop Tribenzor and take olmesartan/amlodipine 40/10 (increase of amlodipine from 5 mg), add in chlorthalidone 25 mg and spironolactone 25 mg.  Stressed that she needs to take meds as prescribed, and if she cannot get these, to please call us immediately, so other arrangements can be made.  To be sure we are getting some lowering in her BP sooner rather than later, I will bring her back in 2-3 weeks for follow up .  Will need to repeat metabolic panel at that time.

## 2020-09-02 NOTE — Progress Notes (Signed)
09/02/2020 AZEALIA MOUREY [birth year] [id number]   HPI:  Whitney [name] is a 35 y.o. female patient of Dr Oval Linsey, with a PMH below who presents today for advanced hypertension clinic management.  She was seen by Dr. Oval Linsey two months ago, at which time her pressure was elevated to 192/140.  She noted having hypertension for the past 2-3 years, and has difficulty with swallowing tablets.  She has been crushing them into yogurt in order to get them down.  Admits to not being compliant with taking the olmesartan/amlodipine/hctz, noting that she gets it in about 70% of the time, but not always at the same time on the days she does take.    Today she returns for follow up.  Was to have divided triple therapy to olmesartan/amlodipine and chlorthalidone, but chlorthalidone was cost prohibitive, and she still had the triple med tablets.  She is currently without health insurance, as her company offers only a high deductible plan, and it was taking $150 per week from her paychecks.   She unfortunately could not afford to live with that deduction.  Since her last visit she finished a 15 day "cleanse" and lost 12 pounds.  She has managed to keep the weight off.  She also had Covid recently and is still noting fatigue and occasional shortness of breath.    Past Medical History: DVT/PE Patient notes first episode DVT/PE, second was just DVT  DM2 3/21 A1c 9.3  OSA Mild, not treating   Blood Pressure Goal:  130/80  Current Medications:  olmesartan/amlodipine/hctz 40-5-12.5  Family Hx: mgm hypertension; father has GI issues; mother healthy; not sure about either sibling; 1 son 72 yrs old  Social Hx: no tobacco, occasional alcohol (< monthly), has been cutting back on caffeine over past few weeks; still some Mt. Dew or 5-hour energy drinks  Diet:  previously did 15 day cleanse, is wanting to do this again in another week or two.  Will to in place of meals tid x 2 weeks, then twice daily with 1 meal  thereafter.  Eats snacks between - tuna or peanut butter on crackers, boiled egg, other protein rich snacks.    Exercise:   Home BP readings: has been coming thru in [place], however did not take as frequently in past 2 weeks due to Covid infection.  5 readings in first 11 days of August.  Average 179/124.    Intolerances: nkda  Labs: 5/22: Na 137, K 3.9, Glu 369, BUN 9, SCr 0.91, GFR 84   Wt Readings from Last 3 Encounters:  09/02/20 (!) 373 lb (169.2 kg)  07/27/20 (!) 383 lb 6.4 oz (173.9 kg)  05/26/20 (!) 371 lb 6.4 oz (168.5 kg)   BP Readings from Last 3 Encounters:  09/02/20 (!) 174/128  07/27/20 (!) 200/130  05/26/20 (!) 192/140   Pulse Readings from Last 3 Encounters:  09/02/20 (!) 103  07/27/20 96  05/26/20 86    Current Outpatient Medications  Medication Sig Dispense Refill   Biotin 10000 MCG TABS Take by mouth.     glucose blood (ONETOUCH VERIO) test strip 1 each by Other route 2 (two) times daily. And lancets 2/day 100 each 12   Insulin Glargine (LANTUS SOLOSTAR) 100 UNIT/ML Solostar Pen Inject 15 Units into the skin daily. 5 pen 3   Lancets (ONETOUCH DELICA PLUS 123XX123) MISC 1 each by Other route as directed.     metFORMIN (GLUCOPHAGE-XR) 500 MG 24 hr tablet Take 2 tablets (1,000  mg total) by mouth daily with breakfast. 180 tablet 3   Multiple Vitamins-Minerals (HAIR/SKIN/NAILS) TABS Take 1 tablet by mouth daily with breakfast.     rivaroxaban (XARELTO) 10 MG TABS tablet Take 1 tablet (10 mg total) by mouth daily. 90 tablet 2   amLODipine-olmesartan (AZOR) 10-40 MG tablet Take 1 tablet by mouth daily. 30 tablet 6   chlorthalidone (HYGROTON) 25 MG tablet Take 1 tablet (25 mg total) by mouth daily. 30 tablet 6   dapagliflozin propanediol (FARXIGA) 10 MG TABS tablet Take 10 mg by mouth daily before breakfast. (Patient not taking: Reported on 09/02/2020) 30 tablet 11   spironolactone (ALDACTONE) 25 MG tablet Take 1 tablet (25 mg total) by mouth daily. 30 tablet 6    No current facility-administered medications for this visit.    No Known Allergies  Past Medical History:  Diagnosis Date   Diabetes mellitus without complication ([place])    type 2   DVT (deep venous thrombosis) ([place]) 03/2018   Gallstones    Headache(784.0)    Hypertension    OSA (obstructive sleep apnea) 05/26/2020   Sleep apnea    does not need cpap    Blood pressure (!) 174/128, pulse (!) 103, resp. rate 16, height '5\' 11"'$  (1.803 m), weight (!) 373 lb (169.2 kg), SpO2 95 %.  Essential hypertension Had frank discussion about the need to get the right medicines and get her pressure under control.  Will have her stop Tribenzor and take olmesartan/amlodipine 40/10 (increase of amlodipine from 5 mg), add in chlorthalidone 25 mg and spironolactone 25 mg.  Stressed that she needs to take meds as prescribed, and if she cannot get these, to please call us immediately, so other arrangements can be made.  To be sure we are getting some lowering in her BP sooner rather than later, I will bring her back in 2-3 weeks for follow up .  Will need to repeat metabolic panel at that time.     Tommy Medal PharmD CPP [facility] [address] [place], [place] [address] [phone]

## 2020-09-03 ENCOUNTER — Other Ambulatory Visit: Payer: Self-pay

## 2020-09-09 ENCOUNTER — Telehealth: Payer: Self-pay

## 2020-09-09 ENCOUNTER — Other Ambulatory Visit: Payer: Self-pay

## 2020-09-09 ENCOUNTER — Ambulatory Visit: Payer: Self-pay

## 2020-09-09 DIAGNOSIS — Z0000 Encounter for general adult medical examination without abnormal findings: Secondary | ICD-10-CM

## 2020-09-09 NOTE — Telephone Encounter (Signed)
Patient called in to confirm rescheduling of appointment for 09/10/20 at 4:30pm via voicemail left by Care Guide. Patient verbalized understanding of the rescheduled date and time and that the Care Guide will call her at this that time.

## 2020-09-09 NOTE — Telephone Encounter (Signed)
Called patient to reschedule health coaching session per her voicemail. Left patient message that health coaching session can be moved to 09/10/2020 at 4:30pm as requested. Patient has been rescheduled and will be called by the Care Guide at this time.

## 2020-09-10 ENCOUNTER — Ambulatory Visit (INDEPENDENT_AMBULATORY_CARE_PROVIDER_SITE_OTHER): Payer: Self-pay

## 2020-09-10 ENCOUNTER — Other Ambulatory Visit: Payer: Self-pay

## 2020-09-10 DIAGNOSIS — Z0000 Encounter for general adult medical examination without abnormal findings: Secondary | ICD-10-CM

## 2020-09-10 NOTE — Progress Notes (Signed)
Appointment Outcome: Completed, Session #: 4 Start time: 4:34pm   End time: 5:00pm   Total Mins: 26 minutes  AGREEMENTS SECTION   Overall Goal(s): Increase physical activity Improving healthy eating habits       Stress management                                                 Agreement/Action Steps Stress management Read bible daily Create daily to-do-list to organize tasks and prioritize health goals Setting tasks reminders in phone Utilize support system Implement wind-down time at 10:30 - 10:50 pm daily Listen to Podcast or play games on phone and write prayers Bedtime at 11:00 pm nightly    Increase Physical Activity Exercise in gym twice/week from 5:15pm - 6:15pm or engage weekly Zumba classes on        Monday and Thursday Walk while canvassing neighborhoods at work/setting up and breaking down events Walking with son 2-3x/week on [employer] track Deep clean and pack one room per weekend   [place] eat after 8-8:30pm nightly Meal prep smoothies and lunch nightly Eating breakfast with son daily    Progress Notes:  Patient stated that it has been a struggle since her son start back to school on August 8th. Patient shared that she has started classes as well this week and had to drop one. Patient thinks this choice will help make her course work manageable.   Patient stated that she had Covid the first week of August and is still feeling fatigue and is just starting to get back together and not having to nap throughout the day. Patient shared that she is slowly getting back on track to adhere to schedule.  Patient has been reading her Bible up until school started for her son. Patient stated that she has not implementing a wind down time since then either. Patient has not listened to Podcast or play games on her phone as her wind-down time since school started. Patient shared that once she gets her son in bed, she is sleep before 11pm because  she is exhausted. Patient continues to make daily to-do list to organize and prioritize tasks/goals. Patient also set necessary reminders in her phone as a backup to her to-do list.   Patient mentioned that they have been eating breakfast. Patient has been meal prepping her son's lunch. Since patient has been falling asleep earlier, she has managed to eat by 8:30pm nightly when she was able to eat. Patient stated that she is just getting her appetite back and has been eating once a day after Covid. Patient stated that she will restart the full smoothie cleanse next week. Prior to Covid, the patient was eating breakfast and dinner mainly.   Patient has been participating in a Zumba class led by a friend. Patient stated that she missed it during the time that she had Covid, and Monday was hard to engage because she felt dizzy but managed to make it through. Patient continues to walk and canvas neighborhoods during work. Patient mentioned that she is packing clothes while getting rooms prepared to move and didn't realize how much stuff she has.   Patient stated that her and her son has not gone walking on the track at the high school since the first time. Patient shared that she is exhausted after picking her son  up and getting settled for the evening. Patient is utilizing her support system to get childcare for her son when necessary to implement some of her steps and to have down time to catch up on her to-do list.   Patient stated that she had stress around paying for school, finding somewhere to move, and having to pay $240 out-of-pocket for health insurance. Patient is concerned that she may have to find a part-time job at this point.    Indicators of Success and Accountability:  Patient stated that getting back organized to maintain schedule is her indicator of success and accountability.  Readiness: Patient is in the action stage of increasing physical activity, improving healthy eating behaviors,  and stress management.  Strengths and Supports: Patient is being supported by family and friends. Patient is organized, mindful, and productive.  Challenges and Barriers: the patient's work/school schedule and son's school schedule may be challenges to implementing action steps over the next two weeks.   Coaching Outcomes: Patient stated that she is thinking about walking with her son on Sundays.   No additional changes have been made to her action steps to implement over the next two weeks.   Attempted: Fulfilled - Patient has been able to implement all action steps for improving health eating habits. Patient has been able to canvas neighborhoods at work, deep clean and pack, and utilize support. Patient has been able to create daily to-do list, setting tasks reminders in her phone, and adhere to her bedtime schedule.  Partial - Patient has not read her Bible or implement a wind-down time since August 8th. Patient walked with son once on high school track. Patient has attended Zumba 2 out of 4 classes.

## 2020-09-15 ENCOUNTER — Telehealth: Payer: Self-pay

## 2020-09-15 ENCOUNTER — Ambulatory Visit: Payer: Self-pay

## 2020-09-15 NOTE — Telephone Encounter (Signed)
Lmom to Missed appt r/s

## 2020-09-15 NOTE — Progress Notes (Deleted)
Patient ID: Whitney [name]                 DOB: [birth year]                      MRN: [id number]     HPI: Whitney [name] is a 35 y.o. female referred by Dr. Oval Linsey to HTN clinic. PMH is significant for  Current HTN meds: chlorthalidone '25mg'$  daily, amlodipine-lmoesartan 10-40 daily Previously tried:  BP goal:   Family History:   Social History:   Diet:   Exercise:   Home BP readings:   Wt Readings from Last 3 Encounters:  09/02/20 (!) 373 lb (169.2 kg)  07/27/20 (!) 383 lb 6.4 oz (173.9 kg)  05/26/20 (!) 371 lb 6.4 oz (168.5 kg)   BP Readings from Last 3 Encounters:  09/02/20 (!) 174/128  07/27/20 (!) 200/130  05/26/20 (!) 192/140   Pulse Readings from Last 3 Encounters:  09/02/20 (!) 103  07/27/20 96  05/26/20 86    Renal function: CrCl cannot be calculated (Patient's most recent lab result is older than the maximum 21 days allowed.).  Past Medical History:  Diagnosis Date   Diabetes mellitus without complication ([place])    type 2   DVT (deep venous thrombosis) ([place]) 03/2018   Gallstones    Headache(784.0)    Hypertension    OSA (obstructive sleep apnea) 05/26/2020   Sleep apnea    does not need cpap    Current Outpatient Medications on File Prior to Visit  Medication Sig Dispense Refill   amLODipine-olmesartan (AZOR) 10-40 MG tablet Take 1 tablet by mouth daily. 30 tablet 6   Biotin 10000 MCG TABS Take by mouth.     chlorthalidone (HYGROTON) 25 MG tablet Take 1 tablet (25 mg total) by mouth daily. 30 tablet 6   dapagliflozin propanediol (FARXIGA) 10 MG TABS tablet Take 10 mg by mouth daily before breakfast. (Patient not taking: Reported on 09/02/2020) 30 tablet 11   glucose blood (ONETOUCH VERIO) test strip 1 each by Other route 2 (two) times daily. And lancets 2/day 100 each 12   Insulin Glargine (LANTUS SOLOSTAR) 100 UNIT/ML Solostar Pen Inject 15 Units into the skin daily. 5 pen 3   Lancets (ONETOUCH DELICA PLUS 123XX123) MISC 1 each by Other route as directed.      metFORMIN (GLUCOPHAGE-XR) 500 MG 24 hr tablet Take 2 tablets (1,000 mg total) by mouth daily with breakfast. 180 tablet 3   Multiple Vitamins-Minerals (HAIR/SKIN/NAILS) TABS Take 1 tablet by mouth daily with breakfast.     rivaroxaban (XARELTO) 10 MG TABS tablet Take 1 tablet (10 mg total) by mouth daily. 90 tablet 2   spironolactone (ALDACTONE) 25 MG tablet Take 1 tablet (25 mg total) by mouth daily. 30 tablet 6   No current facility-administered medications on file prior to visit.    No Known Allergies   Assessment/Plan:  1. Hypertension -

## 2020-09-28 ENCOUNTER — Ambulatory Visit (INDEPENDENT_AMBULATORY_CARE_PROVIDER_SITE_OTHER): Payer: Self-pay

## 2020-09-28 ENCOUNTER — Other Ambulatory Visit: Payer: Self-pay

## 2020-09-28 DIAGNOSIS — Z0000 Encounter for general adult medical examination without abnormal findings: Secondary | ICD-10-CM

## 2020-09-28 NOTE — Progress Notes (Signed)
Appointment Outcome:  Completed, Session #: 5 Start time: 4:31pm   End time: 5:01pm  Total Mins: 30 minutes  AGREEMENTS SECTION   Overall Goal(s): Increase physical activity Improving healthy eating habits       Stress management                                                  Agreement/Action Steps Stress management Read bible daily Create daily to-do-list to organize tasks and prioritize health goals Setting tasks reminders in phone Utilize support system Implement wind-down time at 10:30 - 10:50 pm daily Listen to Podcast or play games on phone and write prayers Bedtime at 11:00 pm nightly    Increase Physical Activity Exercise in gym twice/week from 5:15pm - 6:15pm or engage weekly Zumba classes on        Monday and Thursday Walk while canvassing neighborhoods at work/setting up and breaking down events Walking with son 2-3x/week on [employer] track Deep clean and pack one room per weekend   [place] eat after 8-8:30pm nightly Meal prep smoothies and lunch nightly Eating breakfast with son daily  Progress Notes:  Patient stated that she has not been reading the Bible or listening to Podcasts since school has started because she either is trying to get her son to school on time or is up completing homework assignments at night. Patient has set reminders in her phone calendar, in her planner, and updated her home calendar with homework assignments and previous work-related tasks.   Patient is leaning on her support system during this time. Patient shared that she was laid off work and it is causing her a lot of stress. Patient mentioned not having health insurance anymore because she can no longer afford to pay out of pocket. Patient stated that she can only focus on her finances during this time and not much on herself and the steps she's been implementing. Patient has been focused on updating her resume and reaching out to contacts to secure  another job.   Patient stated that she has been going to bed around 11:30pm - 12am because she is completing homework. Patient has not implemented a wind down time because she is exhausted and fall asleep.   Patient has been attending Zumba classes as scheduled twice a week. Patient stated that she was able to meet her weight loss goal for her birthday on September [birth year]. Patient was walking and canvassing neighborhoods and setting up events before being laid off. Patient has not been walking with her son, but he has been walking with his grandmother. Patient and son was going to walk yesterday but it rained. Patient stated that she has not been able to engage in this step because she has been working hard. Patient has put packing and cleaning on hold in the past two weeks.   Patient has been able to stick to eating no later than 8:30pm in the past two weeks. Patient shared that there were some days that she did not eat because she was stressed. Patient was able to maintain her smoothie challenge, which aided in weight loss. Patient stated that she spends time in the evening to meal prep smoothies and her son's lunch at night. Patient has not been able to eat breakfast with her son daily because she eats after dropping him off  to school.   Care Guide asked patient if she was having connectivity issues with [employer] because the readings are missing from the platform. Patient stated that she has been busy in the morning getting her son ready for school and then comes back home to get herself ready for work that she does not have the time to check her blood pressure. Patient stated that in the evenings, she is busy getting themselves settled for the evening and completing assignments that she does not think about checking her blood pressure.    Indicators of Success and Accountability:  Patient has been able to maintain exercise and follow smoothie challenge to meet her weight loss goal by her birthday.   Readiness: Patient is in the action stage of stress management, increasing physical activity, and improving healthy eating habits.   Strengths and Supports: Patient is being supported by her family. Patient is being optimistic and is determined to meet her goals.  Challenges and Barriers: Patient's school schedule may be a challenge to implementing action steps over the next two weeks. Stress around finding employment may be a challenge as well.   Coaching Outcomes: Patient feels that things are getting done, but she is across the place. Patient will continue to utilize reminders and schedule to aid in completing tasks and staying organized.   Patient stated that she will restart implementing her action steps as outlined now that she is not working. Patient plans to exercise every day now that she has more free time.   Patient will go to coffee house or [employer] to work on school assignments and to job hunt. Patient will implement this step to help stay on task with completing assignments instead of sleeping at home.   Patient will resume checking blood pressure as instructed now that she has more free time.   Care Guide will reach out to [place], [place] [address] to inform her of the patient's insurance status for further follow up.     Attempted: Fulfilled - Patient has created an outline of vary tasks that she needs to complete with the aid of a planner, a calendar at home, and calendar reminders in her phone. Patient is utilizing her support system. Patient is going to Zumba classes twice a week as scheduled. Patient is managing to not eat past 8:30pm and is prepping her smoothies as agreed.  Partial - Patient was recently laid off and has not been canvassing neighborhoods or setting up events in about one week.  Not met - Patient has not been reading the Bible, implementing a wind down time that includes listening to [place]. Patient is going to bed later than 11:00pm nightly. Patient has not walked  with son at nearby high school. Patient has not been packing or deep cleaning rooms to move. Patient is not eating breakfast with son daily.    Not attempted: Dropped/Revised - Patient determined that walking with son and deep cleaning/packing are not a big concern of hers at this time. Patient will not be walking to canvas neighborhoods or setting up events.

## 2020-09-30 ENCOUNTER — Telehealth: Payer: Self-pay | Admitting: Licensed Clinical Social Worker

## 2020-09-30 NOTE — Telephone Encounter (Signed)
Received referral for pt due to no insurance. Appears pt had elected to not continue coverage due to cost and since then has lost her employment.  LCSW attempted to reach pt to assess further needs- no answer at [phone], message left with my contact information.   Westley Hummer, MSW, [place]  [phone]

## 2020-10-04 ENCOUNTER — Telehealth: Payer: Self-pay | Admitting: Licensed Clinical Social Worker

## 2020-10-04 NOTE — Telephone Encounter (Signed)
LCSW left additional message for pt at [phone].  No answer, voicemail included my information for return call.   Westley Hummer, MSW, [place]  [phone]

## 2020-10-05 ENCOUNTER — Telehealth: Payer: Self-pay | Admitting: Licensed Clinical Social Worker

## 2020-10-05 NOTE — Progress Notes (Signed)
Heart and Vascular Care Navigation  10/05/2020  Whitney [name] [birth year] [id number]  Reason for Referral:  Engaged with patient by telephone for initial visit for Heart and Vascular Care Coordination.                                                                                                   Assessment:                               LCSW was able to reach pt via telephone today [phone]). Introduced self role, reason for call. Confirmed home address, PCP office and emergency contact remains her mother. She is the mother of an 3yo boy. 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 She shares that she has been still receiving pay from the company but that will end this month and so she plans on filing for unemployment. She currently does not have prescription coverage, gets her medications from the [facility] near her home. Pt shares that they are okay with basics of utilities, food, and transportation at this time.   LCSW first shared that pt may be eligible for PAP for Farxiga and Xarelto through the manufacturers since she does not have insurance. I explained that she would need to complete the applications and then bring them to the prescribing physicians offices. Inquired if pt has ever applied for Medicaid- she believes she had it when she was pregnant but has not reapplied since. LCSW shared that pt would need to go to DSS to see if she was eligible for Medicaid given coverage status change and that she has a child under 15 in her custody. Once she has been screened for Medicaid and if denied she may be eligible for [employer] and [employer] I shared that I would mail her all of this information along with my card and remain available to assist as needed. Pt states understanding.   HRT/VAS Care Coordination     Patients Home Cardiology Office [place] Team Social Worker   Social Worker Name: Valeda Malm, [place]  [phone]   Living arrangements for the past 2 months Single Family Home   Lives with: Minor Children   Patient Current Insurance Coverage Self-Pay   Patient Has Concern With [place] Yes   Patient Concerns With Medical Bills recently lost insurance, ongoing medical care   Medical Bill Referrals: mailed pt [facility] and [employer] Engineer, water   Does Patient Have Prescription Coverage? No   Home Assistive Devices/Equipment CBG Meter; Eyeglasses       Social History:  SDOH Screenings   Alcohol Screen: Low Risk    Last Alcohol Screening Score (AUDIT): 2  Depression (PHQ2-9): Low Risk    PHQ-2 Score: 0  Financial Resource Strain: Medium Risk   Difficulty of Paying Living Expenses: Somewhat hard  Food Insecurity: No Food Insecurity   Worried About Charity fundraiser in the Last Year: Never true   Ran Out of Food in the Last Year: Never true  Housing: Low Risk    Last Housing Risk Score: 0  Physical Activity: Inactive   Days of Exercise per Week: 0 days   Minutes of Exercise per Session: 0 min  Social Connections: Not on file  Stress: Stress Concern Present   Feeling of Stress : To some extent  Tobacco Use: Medium Risk   Smoking Tobacco Use: Former   Smokeless Tobacco Use: Never  Transportation Needs: No Data processing manager (Medical): No   Lack of Transportation (Non-Medical): No    SDOH Interventions: Financial Resources:  Financial Strain Interventions: Other (Comment), Development worker, community (mailed [employer] and Market researcher) Occupational hygienist for [employer]  Food Insecurity:  Food Insecurity Interventions: Intervention Not Indicated  Housing Insecurity:  Housing Interventions: Intervention Not Indicated  Transportation:   Transportation Interventions: Intervention Not Indicated    Other Care Navigation Interventions:     Provided  Pharmacy assistance resources  Mailed pt AZ&Me and J&J applications for PAPs; provided pt information about [place]   Follow-up plan:   LCSW has sent the following information to pt to complete, along with my card should she like any additional assistance to complete those applications.  [employer] PAP application for Xarelto to be completed by Dr. Libby Maw office, AZ&Me PAP application for [place] to be completed by Dr. Cordelia Pen office, a packet on how to apply for marketplace coverage if interested, a packet on how to apply for Medicaid online or in person at [place] (needed before applying for alternate coverage), [employer] Fish farm manager and [employer].   I will f/u in 2 weeks if I have not heard from pt before that time to ensure received and to answer and additional questions/concerns.

## 2020-10-12 ENCOUNTER — Ambulatory Visit (INDEPENDENT_AMBULATORY_CARE_PROVIDER_SITE_OTHER): Payer: Self-pay

## 2020-10-12 ENCOUNTER — Other Ambulatory Visit: Payer: Self-pay

## 2020-10-12 DIAGNOSIS — Z0000 Encounter for general adult medical examination without abnormal findings: Secondary | ICD-10-CM

## 2020-10-12 NOTE — Progress Notes (Signed)
Appointment Outcome: Completed, Session #: 6 Start time: 4:30pm   End time: 4:57pm   Total Mins: 27 minutes  AGREEMENTS SECTION   Overall Goal(s): Increase physical activity Improving healthy eating habits       Stress management                                                  Agreement/Action Steps Stress management Read bible daily Create daily to-do-list to organize tasks and prioritize health goals Setting tasks reminders in phone Utilize support system Implement wind-down time at 10:30 - 10:50 pm daily Listen to Podcast or play games on phone and write prayers Bedtime at 11:00 pm nightly    Increase Physical Activity Exercise in gym twice/week from 5:15pm - 6:15pm or engage weekly Zumba classes on        Monday and Thursday   [place] Don't eat after 8-8:30pm nightly Meal prep smoothies and lunch nightly   Progress Notes:  Patient stressed that she has been all over the place in the past two weeks, while trying to focus on finding a new job and securing her check from her last job. Patient stated that she's been focused on reorganizing her bills and have lost the structure from her schedule that she had prior to being laid off. Patient stated that she is working to restructure her schedule so that she can implement all her action steps accordingly. 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   Patient stated that it will be better now to structure her days because she has concrete times that she is able to do other work outside of [employer] but expressed that she will need a second job. Patient mentioned that she has not been reading her bible daily or implementing a wind down time so that she can be asleep by 11:00pm. Patient stated that she is going to bed around 12-12:30am.    Patient has continued to create to-do lists to organize tasks and health goals. Patient is also setting reminders in her phone. Patient shared  that although these strategies were implemented, she barely kept up with her assignments.   Patient stated that she has not attended a Zumba class since the end of August and plans to get back on track with going to the gym. Patient stated that a challenge to attending Zumba is having someone to babysit her son during that time. Patient stated that she eats dinner some days but eats no later than 8:30pm on most nights. On most days, patient shared that she grabs something while she is out. Patient mentioned that she is not necessarily meal prepping now with the smoothies because of time.  Patient stated that she has been doing the bare minimum to get things done. Patient has been going to [place] or other coffee houses to do work. Patient stated that she feels more productive away from her computer desk. Patient has been working at her kitchen table, which she finds herself getting tasks accomplished.   Patient shared that she is utilizing her support system, but they do not know all that she is going through. Patient does not want to burden her family with financial issues. Patient mentioned that she is talking positive to herself. Patient is aware that she may need a new routine to  help reduce stress, increase physical activity, and improve healthy eating behaviors.    Indicators of Success and Accountability:  Patient stated that not allowing herself to get too down by pushing through when tired is one indicator of success. Patient also stated that keeping up with her daily tasks and managing her schedule is another indicator of success and accountability.  Readiness: Patient is in the action stage of stress management, increasing physical activity, and improving healthy eating habits.  Strengths and Supports: Patient is being supported by her family. Patient strengths are being mindful and remaining optimistic.  Challenges and Barriers: What could block you or prevent you meeting your  goal?  Coaching Outcomes: Patient plans to meal prep to help with not having anything to eat while at church and having meals available for home when having to work on assignments late.   Patient will create a new schedule that enables her to implement her action steps as outlined above.   Patient plants to continue going to [employer] or a coffee house to complete homework assignments and to search for a second job.   Patient is having to decide on attending Zumba on Thursday but expressed the challenge of her son having an event at school that evening. Patient stated that she will work on a sitter for her son so she can get back into the swing of going to the gym.   Patient will continue health coaching for 6 additional sessions to reevaluate and restructure her stress management, physical activity, and healthy eating behavior action steps.   Patient's takeaway from today's session was that she has been in this situation before and that she cannot allow things to get to her mentally and emotionally.   Attempted: Fulfilled - Patient continues to create to-do lists, set tasks reminders in phone, and utilize her support system. Partial - Patient has been able to eat by 8:30pm on most nights.  Not met - Patient has not read the Bible or implemented a wind down time to get in bed by 11:00pm. Patient has not exercised with Zumba in the past two weeks. Patient is not meal prepping like before.

## 2020-10-19 ENCOUNTER — Telehealth: Payer: Self-pay | Admitting: Licensed Clinical Social Worker

## 2020-10-19 NOTE — Telephone Encounter (Signed)
F/u call made to pt in regards to assistance applications and PAP applications that were mailed to her. No answer at [phone]; left message. I remain available.   Westley Hummer, MSW, [place]  [phone]

## 2020-10-21 ENCOUNTER — Telehealth: Payer: Self-pay | Admitting: Licensed Clinical Social Worker

## 2020-10-21 NOTE — Telephone Encounter (Signed)
No call back yet from pt; I sent text message this morning to check in and see if applications for medication and medical assistance had been received. Remain available.    Westley Hummer, MSW, [place]  [phone]

## 2020-10-25 ENCOUNTER — Inpatient Hospital Stay: Payer: Self-pay

## 2020-10-25 ENCOUNTER — Other Ambulatory Visit: Payer: Self-pay | Admitting: Hematology and Oncology

## 2020-10-25 ENCOUNTER — Inpatient Hospital Stay: Payer: Self-pay | Attending: Hematology and Oncology | Admitting: Hematology and Oncology

## 2020-10-25 ENCOUNTER — Telehealth: Payer: Self-pay | Admitting: Licensed Clinical Social Worker

## 2020-10-25 DIAGNOSIS — I82402 Acute embolism and thrombosis of unspecified deep veins of left lower extremity: Secondary | ICD-10-CM

## 2020-10-25 NOTE — Telephone Encounter (Signed)
Additional message left for pt at [phone]. Remain available should pt desire assistance w/ completing applications.    Westley Hummer, MSW, [place]  [phone]

## 2020-10-25 NOTE — Telephone Encounter (Deleted)
Additional message left for pt at [phone]. Remain avaivale    Westley Hummer, MSW, [place]  [phone]

## 2020-10-26 ENCOUNTER — Ambulatory Visit (INDEPENDENT_AMBULATORY_CARE_PROVIDER_SITE_OTHER): Payer: Self-pay

## 2020-10-26 ENCOUNTER — Other Ambulatory Visit: Payer: Self-pay

## 2020-10-26 DIAGNOSIS — Z0000 Encounter for general adult medical examination without abnormal findings: Secondary | ICD-10-CM

## 2020-10-26 NOTE — Progress Notes (Signed)
Appointment Outcome: Completed, Session #: 7 Start time: 4:30pm   End time: 4:58pm   Total Mins: 28 minutes  AGREEMENTS SECTION   Overall Goal(s): Increase physical activity Improving healthy eating habits       Stress management                                                  Agreement/Action Steps Stress management Read bible daily Create daily to-do-list to organize tasks and prioritize health goals Setting tasks reminders in phone Utilize support system Implement wind-down time at 10:30 - 10:50 pm daily Listen to Podcast or play games on phone and write prayers Bedtime at 11:00 pm nightly    Increase Physical Activity Exercise in gym twice/week from 5:15pm - 6:15pm or engage weekly Zumba classes on        Monday and Thursday   [place] Don't eat after 8-8:30pm nightly Meal prep smoothies and lunch nightly  Progress Notes:  Patient stated that since her finances are diminishing and she has been spending time looking for a job, she really has not been focused on implementing her steps. Patient has spent the remainder of her energy on completing school assignments. Patient stated that she continues to create to-do-list to organize her assignments. Patient also have reminders in her phone, has a planner, and a larger calendar at home that she uses to stay on organized with assignments and household related tasks.   Patient stated that she is using her support system but is burnt out from talking about the same issues with no improvement. Patient is feeling stressed but is working on staying positive by staying diligent to her job search and not giving up on her schoolwork.   Patient is not implementing a wind down time because she is exhausted and falls asleep sometimes around 9-10pm. Depending on the amount of coursework she has; she may go to bed around 11:00pm as agreed. Patient stated that she is trying to optimize her time to focus on school. Patient  stated that she is trying to be realistic about what she can do without overexerting herself.   Patient does not eat past 8:30pm nightly. Patient stated that some nights she doesn't feel like eating and go to bed. Patient stated that she is eating at least once a day. Patient is meal prepping her son's lunch in the evening. Patient stated that she is going to start prepping her lunches today as well for her long days away from the house on Tuesdays and Thursdays.    Indicators of Success and Accountability:  Patient has been able to manage creating        to-do-list to stay on tasks and complete coursework.  Readiness: Patient is in the action stage of stress management and improving healthy eating          behaviors.  Strengths and Supports: Patient is being supported by her family. Patient's strength is her        tenacity. Challenges and Barriers: Patient may be challenged to implement her action steps with her        focus being mainly on finding employment.   Coaching Outcomes: Patient has revised her action steps to match her current priority of completing coursework on time.   Patient will implement the following action steps over the next two weeks.  Agreement/Action Steps: Stress management Create daily to-do-list to organize tasks and prioritize health goals Setting tasks reminders in phone Utilize support system Bedtime at 11:00 pm nightly   [place] eat after 8-8:30pm nightly Meal prep lunch nightly  Attempted: Fulfilled - Patient is creating daily to-do-list, setting tasks reminders in her phone, utilizing her          support system, and getting to bed by 11:00pm. Patient is not eating past 8:30pm nightly, and       meal preps her son's school lunch nightly.    Not Attempted: Dropped/Revised - Patient has decided that the following steps are no longer a priority at this time and will revisit implementing them again at a later date. Read  bible daily Implement wind-down time at 10:30 - 10:50 pm daily Listen to Podcast or play games on phone and write prayers Exercise in gym twice/week from 5:15pm - 6:15pm or engage weekly Zumba classes on Monday and Thursday

## 2020-11-04 ENCOUNTER — Other Ambulatory Visit: Payer: Self-pay

## 2020-11-04 ENCOUNTER — Ambulatory Visit (HOSPITAL_COMMUNITY)
Admission: EM | Admit: 2020-11-04 | Discharge: 2020-11-04 | Disposition: A | Discharge status: Home / Self Care | Payer: Self-pay | Attending: Physician Assistant | Admitting: Physician Assistant

## 2020-11-04 ENCOUNTER — Encounter (HOSPITAL_COMMUNITY): Payer: Self-pay | Admitting: Emergency Medicine

## 2020-11-04 DIAGNOSIS — I10 Essential (primary) hypertension: Secondary | ICD-10-CM

## 2020-11-04 DIAGNOSIS — R030 Elevated blood-pressure reading, without diagnosis of hypertension: Secondary | ICD-10-CM

## 2020-11-04 DIAGNOSIS — I160 Hypertensive urgency: Secondary | ICD-10-CM

## 2020-11-04 DIAGNOSIS — G44209 Tension-type headache, unspecified, not intractable: Secondary | ICD-10-CM

## 2020-11-04 MED ORDER — ACETAMINOPHEN 160 MG/5ML PO SUSP: 1000.0000 mg | Freq: Three times a day (TID) | ORAL | 0 refills | Status: DC | PRN

## 2020-11-04 NOTE — ED Notes (Signed)
Patient is being discharged from the Urgent Care and sent to the Emergency Department via pov . Per erin, pa, patient is in need of higher level of care due to hypertension. Patient is aware and verbalizes understanding of plan of care.  Vitals:   11/04/20 2033 11/04/20 2035  BP: (!) 188/134 (!) 192/146  Pulse:    Resp:    Temp:    SpO2:

## 2020-11-04 NOTE — ED Provider Notes (Signed)
[facility]    CSN: [id number] Arrival date & time: 11/04/20  1906      History   Chief Complaint Chief Complaint  Patient presents with   Nausea   Headache   Chills    HPI Whitney [name] is a 35 y.o. female.   Patient presents today with a 2-day history of headache.  She reports that pain is rated 7 on a 0-10 pain scale, localized to occiput region, described as aching, no aggravating or alleviating factors identified.  She has not tried any over-the-counter medication for symptom management.  She does report associated nausea as well as chills.  Denies any recent illness.  Denies additional symptoms including cough, nasal congestion, vomiting, abdominal pain, diarrhea, chest pain, shortness of breath.  Denies any medication changes or recent antibiotic use.  Denies any recent head injury.  She did take 2 at home COVID test that have both been negative.  Denies any known sick contacts.  She denies history of headache disorder.  She does take antihypertensive medications and reports taking these as prescribed without missing doses.  She denies any increase in sodium consumption, caffeine consumption, recent decongestant use.   Past Medical History:  Diagnosis Date   Diabetes mellitus without complication ([place])    type 2   DVT (deep venous thrombosis) ([place]) 03/2018   Gallstones    Headache(784.0)    Hypertension    OSA (obstructive sleep apnea) 05/26/2020   Sleep apnea    does not need cpap    Patient Active Problem List   Diagnosis Date Noted   Ovarian cyst 02/16/2020   Obstructive sleep apnea syndrome 53/66/4403   Non-alcoholic fatty liver disease 01/02/2020   Positive antinuclear antibody 01/02/2020   Essential hypertension 11/24/2019   Menorrhagia 11/24/2019   Acute deep vein thrombosis (DVT) of popliteal vein of left lower extremity ([place])    Pulmonary emboli ([place]) 04/13/2018   Diabetes mellitus ([place]) 01/17/2016   Morbid obesity ([place]) 12/27/2015    Past  Surgical History:  Procedure Laterality Date   CHOLECYSTECTOMY N/A 03/12/2012   Procedure: LAPAROSCOPIC CHOLECYSTECTOMY;  Surgeon: Harl Bowie, MD;  Location: [facility];  Service: General;  Laterality: N/A;   HYSTEROSCOPY WITH D & C N/A 05/11/2020   Procedure: DILATATION AND CURETTAGE /HYSTEROSCOPY;  Surgeon: Sanjuana Kava, MD;  Location: [facility];  Service: Gynecology;  Laterality: N/A;   LAPAROSCOPY N/A 05/11/2020   Procedure: LAPAROSCOPY OPERATIVE; PELVIC WASHINGS;  Surgeon: Sanjuana Kava, MD;  Location: [facility];  Service: Gynecology;  Laterality: N/A;  OVARIAN CYSTECTOMY    OB History     Gravida  1   Para  1   Term  1   Preterm  0   AB  0   Living  1      SAB  0   IAB  0   Ectopic  0   Multiple  0   Live Births  1            Home Medications    Prior to Admission medications   Medication Sig Start Date End Date Taking? Authorizing Provider  acetaminophen (TYLENOL CHILDRENS) 160 MG/5ML suspension Take 31.3 mLs (1,000 mg total) by mouth every 8 (eight) hours as needed. 11/04/20  Yes Fabian Walder K, PA-C  amLODipine-olmesartan (AZOR) 10-40 MG tablet Take 1 tablet by mouth daily. 09/02/20   Skeet Latch, MD  Biotin 10000 MCG TABS Take by mouth.    [provider]  chlorthalidone (HYGROTON) 25 MG  tablet Take 1 tablet (25 mg total) by mouth daily. 09/02/20   Skeet Latch, MD  dapagliflozin propanediol (FARXIGA) 10 MG TABS tablet Take 10 mg by mouth daily before breakfast. Patient not taking: Reported on 09/02/2020 03/26/19   Renato Shin, MD  glucose blood (ONETOUCH VERIO) test strip 1 each by Other route 2 (two) times daily. And lancets 2/day 03/03/19   Renato Shin, MD  Insulin Glargine (LANTUS SOLOSTAR) 100 UNIT/ML Solostar Pen Inject 15 Units into the skin daily. 02/03/19   Fulp, Ander Gaster, MD  Lancets (ONETOUCH DELICA PLUS [id number]) [place] 1 each by Other route as directed. 03/04/19   [provider]  metFORMIN (GLUCOPHAGE-XR) 500 MG 24 hr tablet Take  2 tablets (1,000 mg total) by mouth daily with breakfast. 03/26/19   Renato Shin, MD  Multiple Vitamins-Minerals (HAIR/SKIN/NAILS) TABS Take 1 tablet by mouth daily with breakfast.    [provider]  rivaroxaban (XARELTO) 10 MG TABS tablet Take 1 tablet (10 mg total) by mouth daily. 04/25/20   Orson Slick, MD  spironolactone (ALDACTONE) 25 MG tablet Take 1 tablet (25 mg total) by mouth daily. 09/02/20   Skeet Latch, MD    Family History Family History  Problem Relation Age of Onset   Healthy Mother    Ulcerative colitis Father    Diabetes Maternal Grandmother    Breast cancer Maternal Grandmother    Hypertension Maternal Grandmother    Syncope episode Maternal Grandfather    Benign prostatic hyperplasia Maternal Grandfather    Brain cancer Paternal Grandmother    Lung cancer Paternal Grandfather    Hypertension Son 8   Anesthesia problems Neg Hx    Hypotension Neg Hx    Malignant hyperthermia Neg Hx    Pseudochol deficiency Neg Hx    Colon cancer Neg Hx    Esophageal cancer Neg Hx    Rectal cancer Neg Hx     Social History Social History   Tobacco Use   Smoking status: Former    Types: Cigarettes    Quit date: 05/24/2011    Years since quitting: 9.4   Smokeless tobacco: Never  Vaping Use   Vaping Use: Never used  Substance Use Topics   Alcohol use: Yes    Comment: Once a month on average   Drug use: No     Allergies   Patient has no known allergies.   Review of Systems Review of Systems  Constitutional:  Positive for activity change and chills. Negative for appetite change, fatigue and fever.  HENT:  Negative for congestion, sinus pressure, sneezing and sore throat.   Eyes:  Negative for photophobia and visual disturbance.  Respiratory:  Negative for cough and shortness of breath.   Cardiovascular:  Negative for chest pain.  Gastrointestinal:  Positive for nausea. Negative for abdominal pain, diarrhea and vomiting.  Musculoskeletal:  Negative  for arthralgias and myalgias.  Neurological:  Positive for headaches. Negative for dizziness, facial asymmetry, speech difficulty, weakness, light-headedness and numbness.    Physical Exam Triage Vital Signs ED Triage Vitals  Enc Vitals Group     BP 11/04/20 1949 (!) 197/131     Pulse Rate 11/04/20 1949 100     Resp 11/04/20 1949 18     Temp 11/04/20 1949 99 F (37.2 C)     Temp src --      SpO2 11/04/20 1949 98 %     Weight --      Height --      Head  Circumference --      Peak Flow --      Pain Score 11/04/20 1947 6     Pain Loc --      Pain Edu? --      Excl. in [place]? --    No data found.  Updated Vital Signs BP (!) 192/146 (BP Location: Right Arm)   Pulse 100   Temp 99 F (37.2 C)   Resp 18   SpO2 98%   Visual Acuity Right Eye Distance:   Left Eye Distance:   Bilateral Distance:    Right Eye Near:   Left Eye Near:    Bilateral Near:     Physical Exam Vitals reviewed.  Constitutional:      General: She is awake. She is not in acute distress.    Appearance: Normal appearance. She is well-developed. She is not ill-appearing.     Comments: Very pleasant female appears stated age no acute distress sitting comfortably in exam room  HENT:     Head: Normocephalic and atraumatic. No raccoon eyes, Battle's sign or contusion.     Right Ear: Tympanic membrane, ear canal and external ear normal. No hemotympanum.     Left Ear: Tympanic membrane, ear canal and external ear normal. No hemotympanum.     Mouth/Throat:     Tongue: Tongue does not deviate from midline.     Pharynx: Uvula midline. No oropharyngeal exudate or posterior oropharyngeal erythema.  Eyes:     Extraocular Movements: Extraocular movements intact.     Pupils: Pupils are equal, round, and reactive to light.  Cardiovascular:     Rate and Rhythm: Normal rate and regular rhythm.     Heart sounds: Normal heart sounds, S1 normal and S2 normal. No murmur heard. Pulmonary:     Effort: Pulmonary effort is  normal.     Breath sounds: Normal breath sounds. No wheezing, rhonchi or rales.     Comments: Clear to auscultation bilaterally Abdominal:     General: Bowel sounds are normal.     Palpations: Abdomen is soft.     Tenderness: There is no abdominal tenderness.  Musculoskeletal:     Cervical back: Normal range of motion and neck supple. No spinous process tenderness or muscular tenderness.     Comments: Strength 5/5 bilateral upper and lower extremities  Lymphadenopathy:     Head:     Right side of head: No submental, submandibular or tonsillar adenopathy.     Left side of head: No submental, submandibular or tonsillar adenopathy.  Neurological:     General: No focal deficit present.     Cranial Nerves: Cranial nerves are intact.     Motor: Motor function is intact.     Coordination: Coordination is intact.     Gait: Gait is intact.     Comments: Cranial nerves II to XII intact.  Psychiatric:        Behavior: Behavior is cooperative.     UC Treatments / Results  Labs (all labs ordered are listed, but only abnormal results are displayed) Labs Reviewed - No data to display  EKG   Radiology No results found.  Procedures Procedures (including critical care time)  Medications Ordered in UC Medications - No data to display  Initial Impression / Assessment and Plan / UC Course  I have reviewed the triage vital signs and the nursing notes.  Pertinent labs & imaging results that were available during my care of the patient were reviewed by me and  considered in my medical decision making (see chart for details).     Discussed that my recommendation is to go to the emergency room given patient is having a significant headache in the setting of high blood pressure.  Patient reports that her blood pressure is typically this high and review of previous readings does indicate this is her normal BP.  She declined to go to the emergency room today but does agree that if symptoms do not  improve overnight she will go to the ER.  She is unable to take pills and cannot take NSAIDs due to elevated blood pressure so Tylenol was sent to pharmacy in liquid form per her request.  Recommended she take all blood pressure medication as prescribed and monitor her blood pressure closely at home.  She is to follow-up with her primary care provider soon as possible for reevaluation.  She is to avoid caffeine, salt, decongestants.  Discussed that if she has any worsening symptoms overnight including worsening headache, dizziness, vision changes, chest pain, shortness of breath she must go to the ER.   Final Clinical Impressions(s) / UC Diagnoses   Final diagnoses:  Tension headache  Elevated blood pressure reading  Hypertensive urgency     Discharge Instructions      As we discussed, I think the safest thing to do would be to go to the emergency room since you are having high blood pressure with a headache.  If your symptoms are not improving or for any point anything worsens you must go to the ER as we discussed.  Please monitor your blood pressure at home.  Avoid caffeine, salt, decongestants.  Make sure you are drinking plenty of fluid.  Use Tylenol for pain.  If your symptoms have not resolved overnight you must go to the hospital as we discussed.  If you develop any new or different symptoms please return for reevaluation.     ED Prescriptions     Medication Sig Dispense Auth. Provider   acetaminophen (TYLENOL CHILDRENS) 160 MG/5ML suspension Take 31.3 mLs (1,000 mg total) by mouth every 8 (eight) hours as needed. 1,000 mL Caylin Nass, Derry Skill, PA-C      PDMP not reviewed this encounter.   Terrilee Croak, PA-C 11/04/20 2043

## 2020-11-04 NOTE — Discharge Instructions (Addendum)
As we discussed, I think the safest thing to do would be to go to the emergency room since you are having high blood pressure with a headache.  If your symptoms are not improving or for any point anything worsens you must go to the ER as we discussed.  Please monitor your blood pressure at home.  Avoid caffeine, salt, decongestants.  Make sure you are drinking plenty of fluid.  Use Tylenol for pain.  If your symptoms have not resolved overnight you must go to the hospital as we discussed.  If you develop any new or different symptoms please return for reevaluation.

## 2020-11-04 NOTE — ED Triage Notes (Signed)
Pt is present today with HA, chills, and nausea. Pt sx started Tuesday evening

## 2020-11-09 ENCOUNTER — Ambulatory Visit (INDEPENDENT_AMBULATORY_CARE_PROVIDER_SITE_OTHER): Payer: Self-pay

## 2020-11-09 ENCOUNTER — Other Ambulatory Visit: Payer: Self-pay

## 2020-11-09 DIAGNOSIS — Z0000 Encounter for general adult medical examination without abnormal findings: Secondary | ICD-10-CM

## 2020-11-09 NOTE — Progress Notes (Addendum)
Appointment Outcome: Completed, Session #: 8 Start time: 4:30pm   End time: 4:58pm   Total Mins: 28 minutes  AGREEMENTS SECTION    Overall Goal(s): Stress management Improve healthy eating behaviors    Agreement/Action Steps:  Stress management Create daily to-do-list to organize tasks and prioritize health goals Setting tasks reminders in phone Utilize support system Bedtime at 11:00 pm nightly   [place] Don't eat after 8-8:30pm nightly Meal prep lunch nightly  Progress Notes:  Patient stated that things are starting to look better for her because she was paid from her last job.  Patient shared that she will be starting a second job next week. Patient mentioned that she is slowly getting back to her normal self. Patient expressed that she is less stressed now that these things have transpired. Patient stated that she has noticed that her mood is different and doesn't feel restricted.   Patient reported that she is getting better at utilizing a to-do list during the week. Patient stated that she is putting notes in her phone and reminders which has been more helpful than remembering to look at her planner. Patient stated that she set notifications in advance depending on what task she needs to complete.   Patient shared that she was using the Daily Habit Tracker app to aid in staying on task. Patient mentioned that this was another form of accountability for her. Patient was also working on tracking her daily water intake. Patient has been able to maintain her bedtime as scheduled.   Patient stated that she has had to be flexible with her eating time (9:00-9:30pm) in the evening because of the number of responsibilities that she has. Patient has been meal prepping lunch nightly for her and her son. Patient stated that bringing her food to work has helped with saving money. Meal prepping has encouraged her to cook more at home.     Indicators of Success and  Accountability:  Patient stated that starting to meal prep and bringing lunch to work is an indicator of success and accountability.  Readiness: Patient is in action stage of stress management. Strengths and Supports: Patient is being supported by family and friends. Patient stated that perseverance is her strength to enable her to keep going through difficult times.  Challenges and Barriers: Patient stated that getting readjusted to a new schedule with second job to ensure that she can implement her action steps consistently.   Coaching Outcomes: Patient stated that she will start incorporating her original steps slowly over time as she figures out her new schedule with starting a second job next week. Patient is interested in starting to read the Bible every day in the morning, writing her prayers at night, and working out again. Patient stated that these steps helped keep her stress down.   Patient will report any additional steps that she incorporates over the next two weeks.   Patient plans to update her wall calendar to aid in staying on task and completing schoolwork on time.   Patient will continue to implement action steps that are outlined above over the next two weeks.   Attempted: Fulfilled - Patient has utilized Economist and phone reminders to aid in completing tasks on time and staying organized. Patient is utilizing her support system more. Patient has been going to bed as scheduled. Patient meal prep their lunches nightly.   Not met - Patient has not been able to get later than 8:30pm nightly.

## 2020-11-21 ENCOUNTER — Other Ambulatory Visit: Payer: Self-pay | Admitting: Pharmacist Clinician (PhC)/ Clinical Pharmacy Specialist

## 2020-11-21 NOTE — Telephone Encounter (Signed)
error 

## 2020-11-23 ENCOUNTER — Other Ambulatory Visit: Payer: Self-pay

## 2020-11-23 ENCOUNTER — Ambulatory Visit (INDEPENDENT_AMBULATORY_CARE_PROVIDER_SITE_OTHER): Payer: Self-pay

## 2020-11-23 DIAGNOSIS — Z0000 Encounter for general adult medical examination without abnormal findings: Secondary | ICD-10-CM

## 2020-11-23 NOTE — Progress Notes (Signed)
Appointment Outcome: Completed, Session #: 9 Start time: 4:31pm   End time: 4:56pm   Total Mins: 25 minutes  AGREEMENTS SECTION  Overall Goal(s): Stress management Improve healthy eating behaviors     Agreement/Action Steps:  Stress management Create daily to-do-list to organize tasks and prioritize health goals Setting tasks reminders in phone Utilize support system Bedtime at 11:00 pm nightly   Improving Health Eating Habits Don't eat after 8-8:30pm nightly Meal prep lunch nightly   Progress Notes:  Patient stated that creating the to-do list has helped with keeping her organized with majority of her schoolwork and household tasks. Patient has continued to set reminders in her phone and planner for classes. Patient shared that she did fall behind on one of her classes because she started to procrastinate. Patient stated that she knows she needs to work on getting better with getting assignments completed on time for this one class.   Patient reported that she is still using her support system, which has been helpful with managing stress. Patient stated that she is sleep by 11:00pm each night. Patient shared that some nights she may be in bed as early as 9-9:30pm when she puts her son to bed because she is exhausted.   Patient stated that trying to eat no later than 8:30pm has been a challenge. Patient stated that she works on chores, such as washing clothes, help with homework, as well has cook dinner, and prepare her son for the next day. Patient reported that she may cook dinner late because of all the things she tries to accomplish in the evenings, which may cause her to eat at 9:30-10:00pm some nights. Patient stated that some nights she does not eat because she is tired and goes to bed early.   Patient stated that meal prepping is going well over the past two weeks. Patient mentioned that cooking has helped her avoid eating out as much. Patient stated that sometimes she may get  fast food on days that she has class before going to work, but not every week. Patient stated that she is cooking for at least 2-3 days at a time. Patient has this meal for lunch and dinner.   Indicators of Success and Accountability:  Patient stated that being able to push forward through her mental struggles, meal prep, and stretch money are her indicators of success and accountability.  Readiness: Patient is in the action stage of improving stress management strategies and improving healthy eating behaviors.  Strengths and Supports: Patient is being supported by friends and family. Patient's self-awareness of her health behaviors has increased. Patient can compartmentalize stressors.  Challenges and Barriers: Patient stated that having a lack of energy and being tired is her only challenge that may interfere with her implementing her action steps over the next two weeks.   Coaching Outcomes: Patient was informed that pharmacy would like to make an appointment with her. Patient stated that Thursday after 3:30pm would be a good time to call her to arrange the appointment.   Patient has devised a plan/to-do-list to organize her strategy for deep cleaning and packing up her apartment one room at a time. Patient stated that she wants to have everything completed by Sunday so that she does not go into the holiday with clutter. Patient stated that this will help her prepare her in advance for when she moves.   Patient is interested in adding back some of her original steps to work towards her goals of stress management and  improving healthy eating behaviors.   Patient expressed that she wants to restart working out to lose weight before the end of the year. Patient stated that she must see what her new schedule will look like after starting her new job. Patient plans to go straight to the gym after getting off work and exercise for an hour before picking up her son.   Patient stated that she has been  more conscious of how many steps she is walking a day. Patient is averaging 5,000 steps per day at least 4-5 days per week and wants to work herself up to 10,000 per day. Patient stated that her first job is a lot of walking, and she tries to get up and move around as much as possible. Patient is hoping that her second job will require more movement to help with reaching het daily step goal.   Patient stated that she wants to start back reading the Bible in the morning and writing her prayers at night. Patient wants to listen to various Pod Casts to aid with stress management. Patient stated that she will be starting the Green Smoothie Cleanse again on Monday, November 7th. Patient have been drinking at least one gallon of water per day and wants to keep this up. Patient stated that she aims for 1.5 gallons to 2 gallons per day.   See below the patient's new agreement/action steps.  Agreement/Action Steps Stress management Read bible daily in the mornings Write prayers at bedtime Listen to Pod Casts daily Create daily to-do-list to organize tasks for school, household chores, exercise Setting tasks reminders in phone Utilize support system Bedtime at 11:00 pm nightly    Increase Physical Activity Exercise in gym twice/week for one hour or engage weekly Zumba classes Drop son off to mother's                Deep clean and pack one room at a time Increase average step count from 5,000 to 10,000 steps per day   [place] eat after 8-8:30pm nightly Begin Green Smoothie Cleanse on November 7th Meal prep lunch/dinner 2-3 days at a time Take lunch to work to reduce eating Drink a minimum of one gallon of water per day  Attempted: Fulfilled - Patient completed the weekly agreement in full and was able to meet the challenge.

## 2020-12-07 ENCOUNTER — Other Ambulatory Visit: Payer: Self-pay

## 2020-12-07 ENCOUNTER — Ambulatory Visit (INDEPENDENT_AMBULATORY_CARE_PROVIDER_SITE_OTHER): Payer: Self-pay

## 2020-12-07 DIAGNOSIS — Z0000 Encounter for general adult medical examination without abnormal findings: Secondary | ICD-10-CM

## 2020-12-07 NOTE — Progress Notes (Signed)
Appointment Outcome: Completed, Session #: 10 Start time: 4:31pm   End time: 4:59pm   Total Mins: 28 minutes  AGREEMENTS SECTION   Agreement/Action Steps Stress management Read bible daily in the mornings Write prayers at bedtime Listen to Pod Casts daily Create daily to-do-list to organize tasks for school, household chores, exercise Setting tasks reminders in phone Utilize support system Bedtime at 11:00 pm nightly    Increase Physical Activity Exercise in gym twice/week for one hour or engage weekly Zumba classes Drop son off to mother's                Deep clean and pack one room at a time Increase average step count from 5,000 to 10,000 steps per day   Whitney [name] eat after 8-8:30pm nightly Begin Green Smoothie Cleanse on November 7th Meal prep lunch/dinner 2-3 days at a time Take lunch to work to reduce eating out Drink a minimum of one gallon of water per day  Progress Notes:  Patient was not able to read the Bible, write her prayers, and listen to Pod Casts daily in the past two weeks. Patient stated that she feels rushed in the morning because she is getting her son ready for school and doesn't have time to implement her morning routine or get herself ready before he leaves for school. Patient stated that after getting her son to school she has 45 minutes to get ready and be to work on time. Patient wants to engage in these action steps because she feels that reading and listening to the Pod Casts helps her regulate her mind for the day and writing her prayers at bedtime is a form of her releasing the day.   Patient has been focused on end-of-semester projects, work/training, and caring for her son. Patient stated that she has been glued to her computer for the most part trying to keep up with assignments. Patient stated that she continues to use a daily to-do-list to organize tasks for things such as homework and household chores. Patient stated that  the backup reminders on her phone are helpful in keeping her on task.   Patient shared that some nights she is in bed as early at 6:30pm because she is emotionally drained. Patient stated that she is not physically tired but emotionally exhausted from her first job. Patient stated that it is hard to hear some of their stories and to be concerned about them and their well-being. Patient stated that she also had to juggle her schoolwork with training for a second job. The patient shared that the training is for 40 hours, and she has not completed it within the two-week time frame, therefore, she has not been assigned a client. Patient stated that she does find time to decompress from work by doing mindless activities such as scrolling on social media or playing a game.   Patient has not been able to exercise due to training, homework, and being tired. Patient wants to exercise but doesn't feel that she will be able to implement this step right now due to the number of responsibilities that she has with school and majority of her time being spent at work. Patient devised a plan to aid in the deep cleaning of each room in the house but have not started packing yet. Patient stated that she did get unmotivated to do a lot of cleaning when she learned that she has four weeks to move somewhere else. Patient shared that this caused  her additional stress and haven't been able to get many chores completed.   Patient has been more mindful of how often she gets up and move around. Patient stated that she has been able to increase her daily steps to approximately 8,000 steps. Patient stated that she moves more at work instead of sitting. Patient stated that she believes she will be able to hit her step goal when she officially starts her second job.   Patient eats most nights by 8:30pm but reported that some nights its around 9:00pm when she eats. Patient stated that she has been cooking at home regularly to meal prep  lunch and dinner for the next day. Patient has been taking her lunch to work, which has helped her reduce how often she eats out and has been able to save money as a result. Patient did not start the Green Smoothie Cleanse because she would need to purchase the products for it but decided to hold off on this step until after she move and make more groceries. Patient reported that she has been able to drink a gallon of water a day or a maximum of 180 oz of water per day, which is approximately 1.4 gallons of water per day. Patient stated that she is pushing herself to drink 1.5 gallons of water per day.    Indicators of Success and Accountability:  Patient stated that getting organized a little better to maintain getting tasks done for school, work, and home is her indicated of success and accountability.  Readiness: Patient is in the action stage of stress management, increasing physical activity, and improving healthy eating habits.  Strengths and Supports: Patient is being supported by family and friends. Patient is taking initiative to live healthier, and has become strategic with work, school, and money which has been a source of stress.  Challenges and Barriers: Patient does not foresee any challenges to implementing her action steps over the next two weeks.   Coaching Outcomes: Patient is interested in engaging in exercise but finds that it is not feasible at this time because she is training for a second job and wants to see what her new work schedule will be like once she is assigned a client. Patient will then execute the plan of having her son at her mom's house while she works out.  Although patient was not able to read the Bible in the mornings, write her prayers, and listen to [place], she feels these were valuable steps that helped her set the tone for the day and allowed her to relieve stress at the end of the day. Patient is considering setting an alarm to write her prayers at a specific  time in the evening. Patient would like to work on getting herself prepared in the morning along with her son when he is getting ready for school so that she will have at least 45 minutes of free time that will allow her to read the Bible and listen to a Pod Cast in the morning.   Patient will follow the action steps outlined below over the next two weeks.   Agreement/Action Steps Stress management Read bible daily in the mornings Write prayers at bedtime Listen to Pod Casts daily Create daily to-do-list to organize tasks for school, household chores, exercise Setting tasks reminders in phone Utilize support system Bedtime at 11:00 pm nightly    Increase Physical Activity Deep clean and pack one room at a time Increase average step count from 5,000 to  10,000 steps per day  Improving Health Eating Habits Don't eat after 8-8:30pm nightly Meal prep lunch/dinner 2-3 days at a time Take lunch to work to reduce eating out Drink a minimum of one gallon of water per day  Attempted: Fulfilled - Patient has continued to create a schedule/to-do list, setting reminders in her phone, utilizing support system, being in bed by 11:00pm nightly, meal prep lunch/dinner and takes lunch to work, and has been able to drink a gallon of water per day.  Partial - Patient has created a cleaning schedule and have broken things down for the deep clean, but have not packed yet, has increased her daily steps from 5,0000 to approximately 8,000 steps per day, and eat most nights by 8:30pm but no later than 9:00pm. Not met - Patient was not able to read the Bible daily in the mornings, write prayers at bedtime, listen to Pod Casts daily, attend Zumba classes, and didn't begin the Green Smoothie Cleanse.    Not attempted: Deferred - Patient expressed the agreement is important but unable to work towards these steps at this time. Will revisit these steps at a later time. Exercise in gym twice/week for one hour or  engage weekly Zumba classes Drop son off to mother's   Begin Green Smoothie Cleanse on November 7th

## 2020-12-21 ENCOUNTER — Telehealth: Payer: Self-pay

## 2020-12-21 ENCOUNTER — Ambulatory Visit: Payer: Self-pay

## 2020-12-21 DIAGNOSIS — Z0000 Encounter for general adult medical examination without abnormal findings: Secondary | ICD-10-CM

## 2020-12-21 NOTE — Telephone Encounter (Signed)
Called patient during schedule health coaching session. Patient forgot that she needed to reschedule because of schoolwork. Patient has been rescheduled for 12/7 at 4:30pm. Patient will be called during this time.   Whitney [name], [facility] Guide, Health Coach [address] [place] [address] Telephone: [phone] Email: Whitney [name]

## 2020-12-29 ENCOUNTER — Ambulatory Visit (INDEPENDENT_AMBULATORY_CARE_PROVIDER_SITE_OTHER): Payer: Self-pay

## 2020-12-29 ENCOUNTER — Other Ambulatory Visit: Payer: Self-pay

## 2020-12-29 DIAGNOSIS — Z0000 Encounter for general adult medical examination without abnormal findings: Secondary | ICD-10-CM

## 2020-12-29 NOTE — Progress Notes (Signed)
Appointment Outcome: Completed, Session #: 11 Start time: 4:31pm   End time: 4:58pm  Total Mins: 27 minutes  AGREEMENTS SECTION   Overall Goal(s): Stress management Increase physical activity Improve healthy eating habits                                        Agreement/Action Steps Stress management Read bible daily in the mornings Write prayers at bedtime Listen to Pod Casts daily Create daily to-do-list to organize tasks for school, household chores, exercise Setting tasks reminders in phone Utilize support system Bedtime at 11:00 pm nightly    Increase Physical Activity Deep clean and pack one room at a time Increase average step count from 5,000 to 10,000 steps per day   [place] eat after 8-8:30pm nightly Meal prep lunch/dinner 2-3 days at a time Take lunch to work to reduce eating out Drink a minimum of one gallon of water per day  Progress Notes:  Patient stated that she has not been able to get started with reading the Bible, writing prayers, and listen to Pod Casts daily. Patient stated that she started writing decrees every day. Patient stated that this helps set her mind for the day and can focus on what she wants to accomplish. Patient has been focused on finishing out the semester by completing final projects and studying for exams, which took up a lot of her time. Patient shared that some nights she was able to go to bed by 11:00pm, but other times it was midnight because of schoolwork.   Patient has been creating to-do lists to remain on tasks with completing final assignments. Patient continued to use phone reminders as a reinforcement. Patient was able to fit in some deep cleaning and packing but couldn't get much done. Patient shared that she has to move by next Friday and will have to get everything done by then. Patient has been focused on her finances and is considered looking for another job because she has been able to start the  second job that she currently was hired for.   Patient stated that she is still cooking at home and avoiding eating out. Patient stated that all of her meals are coming from home. Patient is carrying her lunch to work as well. Patient has been able to maintain not eating after 8:30pm over the past two weeks. Patient reported that she wasn't feeling well yesterday and only drunk 40 ounces of water. Patient stated that all other days she has been able to drink a gallon or more of water without any issues.   Patient has been utilizing her support system. Patient mentioned that her family is supporting her in transitioning between housing, however, patient is still experiencing stress because of the move. Patient has been thinking various issues that is causing her stress so that she can process them effectively and determine her next steps.   Indicators of Success and Accountability:  Patient has been consistent with creating to-do list to stay on task.  Readiness: Patient is in the action phase of stress management, increasing physical activity, and improving healthy eating habits.  Strengths and Supports: Patient is being supported by her family. Patient strength is perseverance.  Challenges and Barriers: Patient may experience stress during house transitioning.   Coaching Outcomes: Patient will continue to work to implement her action steps as outlined above over the next  two weeks.   Attempted: Fulfilled - Patient has been able to maintain not eating after 8:30pm, meal prepping and taking her lunch to work to minimize fast food consumption. Patient has maintained a to-do list daily and setting reminders in her phone. Patient is utilizing support.  Partial - Patient has been able to drink a gallon of water or more except one day in the past two weeks. Patient has increased her walking by approximately 1,000 steps per day over the past two weeks (average of 6,000 steps per day). Patient has done some  cleaning and packing but have not finished. Patient went to bed some nights later than 11:00pm.  Not met - Patient did not attempt to read Bible, write prayers, listen to Pod Casts daily since adding these steps back.

## 2021-01-12 ENCOUNTER — Ambulatory Visit (INDEPENDENT_AMBULATORY_CARE_PROVIDER_SITE_OTHER): Payer: Self-pay

## 2021-01-12 ENCOUNTER — Other Ambulatory Visit: Payer: Self-pay

## 2021-01-12 DIAGNOSIS — Z0000 Encounter for general adult medical examination without abnormal findings: Secondary | ICD-10-CM

## 2021-01-12 NOTE — Progress Notes (Signed)
Appointment Outcome: Completed, Session #: 12 Start time: 4:30pm   End time: 4:56pm   Total Mins: 26 minutes  AGREEMENTS SECTION      Overall Goal(s): Stress management Increase physical activity Improve healthy eating habits                                         Agreement/Action Steps Stress management Read bible daily in the mornings Write prayers at bedtime Listen to Pod Casts daily Create daily to-do-list to organize tasks for school, household chores, exercise Setting tasks reminders in phone Utilize support system Bedtime at 11:00 pm nightly    Increase Physical Activity Deep clean and pack one room at a time Increase average step count from 5,000 to 10,000 steps per day   [place] eat after 8-8:30pm nightly Meal prep lunch/dinner 2-3 days at a time Take lunch to work to reduce eating out  Progress Notes:  Patient stated that she has not been reading the Bible in the mornings or writing her prayers at night. Patient reported that she says her prayers instead of writing but wants to get back to writing them instead. Patient mentioned that she writes a decree for the day. Patient stated that she may find inspiration from a short video on YouTube or TikTok. Patient stated that she has been watching those videos instead of listening to [place]. Patient shared that she has created a list of Pod Casts that she plans on watching.   Patient shared that she continues to write her daily to-do list and set phone reminders. Patient stated that she isn't using her big calendar as much because she doesn't have many appointments or assignments since school is out.   Patient has utilized her support system during her transition period. Patient expressed that she is not trying to rely on them as much because she is being intentional about choosing not to be stressed. Patient mentioned that her and some friends are encouraging each other and have a like mind  to handle stress different. Patient stated that they exchange inspirational videos from social media. Patient is strengthening her mental and emotional well-being through positive conversations.   Patient stated that some nights she is not able to get in bed by 11:00pm. Patient stated that since school has ended, she has been having game night with her son playing card games or making some "me time" by watching tv or playing games on her phone before bed. Patient stated that since she has had more time on her hands and do not have a packed schedule, she is using the extra time to relax in the evening.   Patient explained that she was not able to get all the deep cleaning and packing done as she would have liked. Patient stated that she will have to move by the end of next week and she isn't in a rush. 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 Patient mentioned that moving at this time is her major source of stress.   Patient has been working on increasing her step count from 5,000 steps per day to 10,000 steps. Patient step count ranged from 7,000 - 8,100 steps per day over the past two weeks.   Patient reported that she continues not to eat past 8:30pm. Patient is meal prepping daily and continues to take her  lunch to work each day to avoid eating out. Patient stated that she has been able to maintain drinking one gallon of water per day (160 - 180 oz) and is aiming to drink 1.5 gallons of water per day.      Indicators of Success and Accountability:  Patient stated that making the choice not to have 2023 be controlled by stress like the last two years is her indicator of success and accountability.  Readiness: Patient stated is in the action phase of stress management, increasing physical activity, and improving healthy eating habits.  Strengths and Supports: Patient is being supported by her family and friends. Patient has become intentional in her  thoughts and behaviors to reduce stress. Patient has been reflective and has had mindset change.  Challenges and Barriers: Patient does not foresee any challenges to implementing her action steps.    Coaching Outcomes: Patient stated that after she moves, she is planning on returning to the gym in the evenings. Patient mentioned that she wouldn't have an excuse to not exercise since her son would be with her mom during this time and do not have to travel to drop off and pick him up.   Patient will write down personal and business goals for the upcoming year. Patient shared that she has had a mindset change and has decided not to live the same way that she had done before being stressed. Patient mentioned that she is letting things go and choosing what she feed into.   Patient is planning on participating in a plate smashing activity for the new year. Patient will write what you want to change/get rid of during the new year before breaking the plate.   Patient will continue to implement her action steps over the next month. Patient has been scheduled for her one month follow up.    Attempted: Fulfilled - Patient continues to write to-do list and set reminders in her phone, utilize her support system, not eating after 8:30pm, meal prep and take lunch to work to reduce eating out, and drinks a gallon of water per day.  Partial - Patient goes to bed some nights later than 11:00pm, patient was able to get some deep cleaning and packing done but is not finished and has increased her steps from 5,000 to between 7,000-8,100 steps per day.  Not met - Patient did not read Bible, or write prayers, or listen to Pod Casts.

## 2021-02-09 ENCOUNTER — Ambulatory Visit: Payer: Self-pay

## 2021-02-09 ENCOUNTER — Telehealth: Payer: Self-pay

## 2021-02-09 DIAGNOSIS — Z0000 Encounter for general adult medical examination without abnormal findings: Secondary | ICD-10-CM

## 2021-02-09 NOTE — Telephone Encounter (Signed)
Called patient for 75-month health coaching follow up. Patient did not answer the phone. Left message for patient to return call to reschedule.    Kimm Ungaro [name], [facility] Guide, Health Coach [address] [place] [address] Telephone: [phone] Email: [email or web address]

## 2021-02-09 NOTE — Telephone Encounter (Signed)
Patient returned phone call to reschedule. Patient's health coaching session has been rescheduled for 4:30pm on 02/10/21. Patient wil; be called at this time.  Tery Hoeger [name], [facility] Guide, Health Coach [address], [address] [place] [address] Telephone: [phone] Email: [email or web address]

## 2021-02-10 ENCOUNTER — Other Ambulatory Visit: Payer: Self-pay

## 2021-02-10 ENCOUNTER — Ambulatory Visit (INDEPENDENT_AMBULATORY_CARE_PROVIDER_SITE_OTHER): Payer: Self-pay

## 2021-02-10 DIAGNOSIS — Z0000 Encounter for general adult medical examination without abnormal findings: Secondary | ICD-10-CM

## 2021-02-10 NOTE — Progress Notes (Signed)
Appointment Outcome: Completed, Session #: 54-monthf/u Start time: 4:32pm   End time: 5:00pm   Total Mins: 28 minutes  AGREEMENTS SECTION   Overall Goal(s): Stress management Increase physical activity Improve healthy eating habits                                         Agreement/Action Steps Stress management Read bible daily in the mornings Write prayers at bedtime Listen to Pod Casts daily Create daily to-do-list to organize tasks for school, household chores, exercise Setting tasks reminders in phone Utilize support system Bedtime at 11:00 pm nightly    Increase Physical Activity Deep clean and pack one room at a time Increase average step count from 5,000 to 10,000 steps per day   [place] after 8-8:30pm nightly Meal prep lunch/dinner 2-3 days at a time Take lunch to work to reduce eating out  Progress Notes:  Patient stated that she has established a morning routine that includes reading the Bible, eat breakfast, and drink green/black tea after getting up at 5:30am. Patient stated that if she was not able to read in the morning, then she will read at night. Patient stated that she feels lighter and has had a great start to the New Year. Patient mentioned that she has been very specific while writing her prayers at night. Patient stated that she feels a difference by being intentional with implementing her action steps.   Patient stated that she has started a mental health journal. Patient writes the verse of the day in her journal, followed by positive affirmations, and her decree. Patient mentioned that she writes down her biggest achievement. Patient reflects on her daily score of what she was able to check off. Patient stated that she looks at the average score to determine what she needs to change and how she can handle something different.   Patient shared that she continues to create to-do list in her phone and journal. Patient has  created a productivity percentage by checking off what's done. Patient stated that the first week of the new year was rough because she was processing the murder of a co-worker. Patient stated that she has been engaging with her support system and hold each other accountable by providing encouragement and spiritual enlightenment.   Patient has been engaged in self-care to reduce her stress. Patient has been managing her stress by asking herself what the small things are she can do. Patient purchased an adult coloring book to aid in stress management. Patient has been engaged in 10 minutes of Yoga or meditation in the morning. Patient stated that to reduce stress in the morning, she tries to give herself 1 hour to herself in the morning.  Patient stated that she has been tracking her steps. Patient stated that her average daily step count is between 5-6,000 steps/day. Patient has been able to reach 8,000 steps over the weekend by jogging/walking in place. Patient is also listening to her Pod Casts on her Kindle for 30 minutes while being physically active.   Patient has been working on packing but have not been able to finish her deep cleaning. Patient mentioned that she will be moving at the end of the month. Patient admitted that she has been procrastinating because she is only moving from grandparents' home to live with her mother.   Patient have eaten past 8:30pm  on some occasions because of the holidays and ending her evenings late. Patient has been able to meal prep as usual but takes lunch to work 4 out of 5 days majority of the time. Patient eats [employer] on Fridays with her co-workers if she does not bring lunch. Patient stated that she is working ion trying to stop drinking [employer] and the 5 hr energy shots.  Patient has not worked her way back into a sleeping routine over the past month, by going to bed no later than 10pm.   Indicators of Success and Accountability:  Patient has been able  to implement her morning routine to include reading the Bible.  Readiness: Patient is in the action phase of stress management, improving healthy eating habits, and increasing physical activity.  Strengths and Supports: Patient is being supported by family and friends. Patient is intentional about implementing her action steps.  Challenges and Barriers: Patient does not foresee any challenges to implementing her steps over the next month.   Coaching Outcomes: Patient will continue to implement action steps over the next month to determine if she is able to maintain her steps. Patient did not make changes to her steps.   Patient will continue to work towards increasing her physical activity to increase her step count per day.   Attempted: Fulfilled - Patient has been reading the Bible daily, write her prayers, listen to Pod Casts daily, create daily to-do lists/setting tasks reminders in phone, utilizing her support system Partial - Patient is taking lunch to work 4 out of 5 days a week. Patient increased steps from 5,000 steps/day to 6,000 steps/day. Patient has started packing but has not finished deep cleaning. Patient continues to meal prep 2-3 days at a time and take lunch to work to reduce eating out.  Not met - Patient has not been able to go to bed at 11:00pm nightly over the past two weeks. Patient has eaten past 8:30pm due to the holidays and ending the night late.

## 2021-03-08 ENCOUNTER — Observation Stay (HOSPITAL_BASED_OUTPATIENT_CLINIC_OR_DEPARTMENT_OTHER)
Admission: EM | Admit: 2021-03-08 | Discharge: 2021-03-10 | Disposition: A | Discharge status: Home / Self Care | Payer: Self-pay | Attending: Family Medicine | Admitting: Family Medicine

## 2021-03-08 ENCOUNTER — Emergency Department (HOSPITAL_BASED_OUTPATIENT_CLINIC_OR_DEPARTMENT_OTHER): Payer: Self-pay

## 2021-03-08 ENCOUNTER — Other Ambulatory Visit: Payer: Self-pay

## 2021-03-08 ENCOUNTER — Encounter (HOSPITAL_BASED_OUTPATIENT_CLINIC_OR_DEPARTMENT_OTHER): Payer: Self-pay | Admitting: Emergency Medicine

## 2021-03-08 DIAGNOSIS — I10 Essential (primary) hypertension: Secondary | ICD-10-CM | POA: Diagnosis present

## 2021-03-08 DIAGNOSIS — I169 Hypertensive crisis, unspecified: Principal | ICD-10-CM | POA: Diagnosis present

## 2021-03-08 DIAGNOSIS — Z7984 Long term (current) use of oral hypoglycemic drugs: Secondary | ICD-10-CM | POA: Insufficient documentation

## 2021-03-08 DIAGNOSIS — Z87891 Personal history of nicotine dependence: Secondary | ICD-10-CM | POA: Insufficient documentation

## 2021-03-08 DIAGNOSIS — Z794 Long term (current) use of insulin: Secondary | ICD-10-CM | POA: Insufficient documentation

## 2021-03-08 DIAGNOSIS — Z20822 Contact with and (suspected) exposure to covid-19: Secondary | ICD-10-CM | POA: Insufficient documentation

## 2021-03-08 DIAGNOSIS — I161 Hypertensive emergency: Secondary | ICD-10-CM

## 2021-03-08 DIAGNOSIS — E119 Type 2 diabetes mellitus without complications: Secondary | ICD-10-CM

## 2021-03-08 DIAGNOSIS — Z7901 Long term (current) use of anticoagulants: Secondary | ICD-10-CM | POA: Insufficient documentation

## 2021-03-08 DIAGNOSIS — Z86711 Personal history of pulmonary embolism: Secondary | ICD-10-CM | POA: Insufficient documentation

## 2021-03-08 DIAGNOSIS — Z79899 Other long term (current) drug therapy: Secondary | ICD-10-CM | POA: Insufficient documentation

## 2021-03-08 DIAGNOSIS — G4733 Obstructive sleep apnea (adult) (pediatric): Secondary | ICD-10-CM | POA: Diagnosis present

## 2021-03-08 DIAGNOSIS — J029 Acute pharyngitis, unspecified: Secondary | ICD-10-CM | POA: Insufficient documentation

## 2021-03-08 DIAGNOSIS — Z86718 Personal history of other venous thrombosis and embolism: Secondary | ICD-10-CM | POA: Insufficient documentation

## 2021-03-08 DIAGNOSIS — R0981 Nasal congestion: Secondary | ICD-10-CM | POA: Insufficient documentation

## 2021-03-08 DIAGNOSIS — I2699 Other pulmonary embolism without acute cor pulmonale: Secondary | ICD-10-CM | POA: Diagnosis present

## 2021-03-08 LAB — TROPONIN I (HIGH SENSITIVITY)
Troponin I (High Sensitivity): 32 ng/L — ABNORMAL HIGH (ref <18)
Troponin I (High Sensitivity): 25 ng/L — ABNORMAL HIGH (ref <18)

## 2021-03-08 LAB — CBC WITH DIFFERENTIAL/PLATELET
Abs Immature Granulocytes: 0.04 10*3/uL (ref 0.00–0.07)
Basophils Absolute: 0.1 10*3/uL (ref 0.0–0.1)
Basophils Relative: 1 %
Eosinophils Absolute: 0.2 10*3/uL (ref 0.0–0.5)
Eosinophils Relative: 2 %
HCT: 44.1 % (ref 36.0–46.0)
Hemoglobin: 14.5 g/dL (ref 12.0–15.0)
Immature Granulocytes: 0 %
Lymphocytes Relative: 25 %
Lymphs Abs: 2.4 10*3/uL (ref 0.7–4.0)
MCH: 25.7 pg — ABNORMAL LOW (ref 26.0–34.0)
MCHC: 32.9 g/dL (ref 30.0–36.0)
MCV: 78.2 fL — ABNORMAL LOW (ref 80.0–100.0)
Monocytes Absolute: 0.9 10*3/uL (ref 0.1–1.0)
Monocytes Relative: 9 %
Neutro Abs: 6.1 10*3/uL (ref 1.7–7.7)
Neutrophils Relative %: 63 %
Platelets: 269 10*3/uL (ref 150–400)
RBC: 5.64 MIL/uL — ABNORMAL HIGH (ref 3.87–5.11)
RDW: 13.1 % (ref 11.5–15.5)
WBC: 9.7 10*3/uL (ref 4.0–10.5)
nRBC: 0 % (ref 0.0–0.2)

## 2021-03-08 LAB — RESP PANEL BY RT-PCR (FLU A&B, COVID) ARPGX2
Influenza A by PCR: NEGATIVE
Influenza B by PCR: NEGATIVE
SARS Coronavirus 2 by RT PCR: NEGATIVE

## 2021-03-08 LAB — URINALYSIS, ROUTINE W REFLEX MICROSCOPIC
Bilirubin Urine: NEGATIVE
Glucose, UA: >1000 mg/dL — AB
Hgb urine dipstick: NEGATIVE
Ketones, ur: NEGATIVE mg/dL
Leukocytes,Ua: NEGATIVE
Nitrite: NEGATIVE
Protein, ur: 30 mg/dL — AB
Specific Gravity, Urine: 1.024 (ref 1.005–1.030)
pH: 5 (ref 5.0–8.0)

## 2021-03-08 LAB — BASIC METABOLIC PANEL
Anion gap: 8 (ref 5–15)
BUN: 10 mg/dL (ref 6–20)
CO2: 27 mmol/L (ref 22–32)
Calcium: 10.5 mg/dL — ABNORMAL HIGH (ref 8.9–10.3)
Chloride: 102 mmol/L (ref 98–111)
Creatinine, Ser: 0.93 mg/dL (ref 0.44–1.00)
GFR, Estimated: >60 mL/min (ref >60)
Glucose, Bld: 210 mg/dL — ABNORMAL HIGH (ref 70–99)
Potassium: 3.8 mmol/L (ref 3.5–5.1)
Sodium: 137 mmol/L (ref 135–145)

## 2021-03-08 LAB — PROTIME-INR
INR: 1 (ref 0.8–1.2)
Prothrombin Time: 13.5 seconds (ref 11.4–15.2)

## 2021-03-08 LAB — PREGNANCY, URINE: Preg Test, Ur: NEGATIVE

## 2021-03-08 LAB — GROUP A STREP BY PCR: Group A Strep by PCR: NOT DETECTED

## 2021-03-08 MED ORDER — FENTANYL CITRATE PF 50 MCG/ML IJ SOSY
50.0000 ug | PREFILLED_SYRINGE | Freq: Once | INTRAMUSCULAR | Status: AC
  Administered 2021-03-08: 50 ug via INTRAVENOUS
  Filled 2021-03-08: qty 1

## 2021-03-08 MED ORDER — LABETALOL HCL 5 MG/ML IV SOLN
10.0000 mg | Freq: Once | INTRAVENOUS | Status: AC
  Administered 2021-03-08: 10 mg via INTRAVENOUS
  Filled 2021-03-08: qty 4

## 2021-03-08 MED ORDER — NITROGLYCERIN IN D5W 200-5 MCG/ML-% IV SOLN
0.0000 ug/min | INTRAVENOUS | Status: DC
  Administered 2021-03-08: 5 ug/min via INTRAVENOUS
  Filled 2021-03-08: qty 250

## 2021-03-08 MED ORDER — LABETALOL HCL 5 MG/ML IV SOLN: 20.0000 mg | INTRAVENOUS | Status: DC | PRN

## 2021-03-08 MED ORDER — ONDANSETRON HCL 4 MG/2ML IJ SOLN
4.0000 mg | Freq: Once | INTRAMUSCULAR | Status: AC
  Administered 2021-03-08: 4 mg via INTRAVENOUS
  Filled 2021-03-08: qty 2

## 2021-03-08 MED ORDER — ACETAMINOPHEN 325 MG PO TABS
650.0000 mg | ORAL_TABLET | Freq: Four times a day (QID) | ORAL | Status: DC | PRN
  Administered 2021-03-08: 22:00:00 650 mg via ORAL
  Filled 2021-03-08: qty 2

## 2021-03-08 MED ORDER — LABETALOL HCL 5 MG/ML IV SOLN: 10.0000 mg | INTRAVENOUS | Status: DC | PRN

## 2021-03-08 NOTE — ED Notes (Addendum)
Dr. Karle Starch made aware of BP 168/103. Keep pt at current rate and continue to monitor BP. If SBP greater than 190 then give prn labetalol per MD  Pt will continue at current rate: Nitro 85 mcg

## 2021-03-08 NOTE — ED Notes (Signed)
Rounded on pt. Provider at beside. Will continue to monitor

## 2021-03-08 NOTE — ED Provider Notes (Signed)
[facility] EMERGENCY DEPT Provider Note   CSN: [id number] Arrival date & time: 03/08/21  1503     History  Chief Complaint  Patient presents with   Sore Throat    Whitney [name] is a 36 y.o. female with history of obesity, PE anticoagulated on Xarelto, 4 days of sore throat, headache, congestion, sinus pressure, and fatigue.  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  She did not get a flu vaccination this year.  Patient has been treating her symptoms with TheraFlu and NyQuil at home without improvement in her symptoms.  Last dose yesterday.  She does state that she is compliant with all of her medications and is otherwise negative her Xarelto or antihypertensive medications.  She is noted to be significantly hypertensive on intake today.  Level 5 caveat due to acuity of presentation upon arrival.  I have personally reviewed her medical records.  In addition to the above listed she has history of type 2 diabetes and NASH.  HPI     Home Medications Prior to Admission medications   Medication Sig Start Date End Date Taking? Authorizing Provider  acetaminophen (TYLENOL CHILDRENS) 160 MG/5ML suspension Take 31.3 mLs (1,000 mg total) by mouth every 8 (eight) hours as needed. 11/04/20   Raspet, Derry Skill, PA-C  amLODipine-olmesartan (AZOR) 10-40 MG tablet Take 1 tablet by mouth daily. 09/02/20   Skeet Latch, MD  Biotin 10000 MCG TABS Take by mouth.    [provider]  chlorthalidone (HYGROTON) 25 MG tablet Take 1 tablet (25 mg total) by mouth daily. 09/02/20   Skeet Latch, MD  dapagliflozin propanediol (FARXIGA) 10 MG TABS tablet Take 10 mg by mouth daily before breakfast. Patient not taking: Reported on 09/02/2020 03/26/19   Renato Shin, MD  glucose blood (ONETOUCH VERIO) test strip 1 each by Other route 2 (two) times daily. And lancets 2/day 03/03/19   Renato Shin, MD  Insulin Glargine (LANTUS  SOLOSTAR) 100 UNIT/ML Solostar Pen Inject 15 Units into the skin daily. 02/03/19   Fulp, Ander Gaster, MD  Lancets (ONETOUCH DELICA PLUS LYYTKP54S) [place] 1 each by Other route as directed. 03/04/19   [provider]  metFORMIN (GLUCOPHAGE-XR) 500 MG 24 hr tablet Take 2 tablets (1,000 mg total) by mouth daily with breakfast. 03/26/19   Renato Shin, MD  Multiple Vitamins-Minerals (HAIR/SKIN/NAILS) TABS Take 1 tablet by mouth daily with breakfast.    [provider]  rivaroxaban (XARELTO) 10 MG TABS tablet Take 1 tablet (10 mg total) by mouth daily. 04/25/20   Orson Slick, MD  spironolactone (ALDACTONE) 25 MG tablet Take 1 tablet (25 mg total) by mouth daily. 09/02/20   Skeet Latch, MD      Allergies    Patient has no known allergies.    Review of Systems   Review of Systems  Constitutional:  Positive for activity change, appetite change, chills, fatigue and fever.  HENT:  Positive for congestion and sore throat. Negative for trouble swallowing.   Eyes: Negative.   Respiratory:  Positive for chest tightness. Negative for cough, shortness of breath and stridor.   Cardiovascular:  Positive for chest pain. Negative for palpitations and leg swelling.  Gastrointestinal: Negative.   Endocrine: Negative.   Genitourinary: Negative.   Musculoskeletal: Negative.   Skin: Negative.   Neurological: Negative.   Hematological: Negative.    Physical Exam Updated Vital Signs BP (!) 239/148 (BP Location: Right Arm)  Pulse (!) 108    Temp 97.9 F (36.6 C)    Resp 16    Ht 5\' 11"  (1.803 m)    Wt (!) 152 kg    LMP 02/23/2021    SpO2 98%    BMI 46.72 kg/m  Physical Exam Vitals and nursing note reviewed.  Constitutional:      Appearance: She is morbidly obese. She is not ill-appearing or toxic-appearing.  HENT:     Head: Normocephalic and atraumatic.     Nose: Congestion and rhinorrhea present. Rhinorrhea is clear.     Mouth/Throat:     Mouth: Mucous membranes are moist.     Pharynx:  Oropharynx is clear. Uvula midline. No oropharyngeal exudate or posterior oropharyngeal erythema.     Tonsils: No tonsillar exudate.  Eyes:     General: Lids are normal. Vision grossly intact.        Right eye: No discharge.        Left eye: No discharge.     Extraocular Movements: Extraocular movements intact.     Conjunctiva/sclera: Conjunctivae normal.     Pupils: Pupils are equal, round, and reactive to light.  Neck:     Trachea: Trachea and phonation normal.     Meningeal: Brudzinski's sign and Kernig's sign absent.  Cardiovascular:     Rate and Rhythm: Normal rate and regular rhythm.     Pulses: Normal pulses.     Heart sounds: Normal heart sounds. No murmur heard. Pulmonary:     Effort: Pulmonary effort is normal. No tachypnea, bradypnea, accessory muscle usage, prolonged expiration or respiratory distress.     Breath sounds: Normal breath sounds. No wheezing or rales.  Chest:     Chest wall: No mass, lacerations, deformity, swelling, tenderness, crepitus or edema.  Abdominal:     General: Bowel sounds are normal. There is no distension.     Palpations: Abdomen is soft.     Tenderness: There is no abdominal tenderness. There is no right CVA tenderness, left CVA tenderness, guarding or rebound.  Musculoskeletal:        General: No deformity.     Cervical back: Normal range of motion and neck supple.  Lymphadenopathy:     Cervical: No cervical adenopathy.  Skin:    General: Skin is warm and dry.     Capillary Refill: Capillary refill takes less than 2 seconds.  Neurological:     General: No focal deficit present.     Mental Status: She is alert and oriented to person, place, and time. Mental status is at baseline.  Psychiatric:        Mood and Affect: Mood normal.    ED Results / Procedures / Treatments   Labs (all labs ordered are listed, but only abnormal results are displayed) Labs Reviewed  RESP PANEL BY RT-PCR (FLU A&B, COVID) ARPGX2  GROUP A STREP BY PCR     EKG None  Radiology No results found.  Procedures .Critical Care Performed by: Emeline Darling, PA-C Authorized by: Emeline Darling, PA-C   Critical care provider statement:    Critical care time (minutes):  45   Critical care was time spent personally by me on the following activities:  Development of treatment plan with patient or surrogate, discussions with consultants, evaluation of patient's response to treatment, examination of patient, obtaining history from patient or surrogate, ordering and performing treatments and interventions, ordering and review of laboratory studies, ordering and review of radiographic studies, pulse oximetry and re-evaluation  of patient's condition    Medications Ordered in ED Medications - No data to display  ED Course/ Medical Decision Making/ A&P Clinical Course as of 03/08/21 2227  Tue Mar 08, 2021  2100 Consult to hospitalist, Dr. Myna Hidalgo, who is agreeable getting this patient to his service.  He is requesting administration of nitroglycerin drip to keep SBP under 180.  This order has been placed according to his direction.  I appreciate his collaboration in the care of this patient. [RS]    Clinical Course User Index [RS] Mayetta Castleman, Gypsy Balsam, PA-C                           Medical Decision Making 36 year old  female who presents with concern for chest heaviness/pressure.  Differential diagnosis includes limited to ACS, PE, pleural vision, pneumothorax, pneumonia, hypertensive urgency/emergency, URI.  Tachycardic, exquisitely hypertensive, and tachypneic on intake.  Cardiopulmonary exam is unremarkable at time of my evaluation with no regular rate and rhythm.  Abdominal exam is benign.  Patient with some clear rhinorrhea.  Neurovascularly intact in all 4 extremities.  Amount and/or Complexity of Data Reviewed Labs: ordered.    Details: CBC is unremarkable.  BMP unremarkable.  UA with protein in the urine, pregnancy test is  negative, troponin elevated to 32, delta troponin improved to 25. Radiology: ordered.    Details: Chest x-ray negative for acute cardiopulmonary disease.  CT of the head negative for acute intracranial normality. ECG/medicine tests: ordered and independent interpretation performed.    Details: EKG with sinus rhythm without STEMI.  Risk OTC drugs. Prescription drug management. Decision regarding hospitalization.    Given patient's elevated troponin in context of chest pressure and exquisite hypertension, clinical picture most consistent with hypertensive emergency.  Dose of labetalol administered with improvement to her MAP into the goal range.  Goal MAP 10 to 20% decrease from intake MAP is 138-155.  This was achieved with single dose of labetalol.  Do recommend admission to the hospital for further stabilization and management of hypertensive emergency.  Tinea voiced understanding of her medical evaluation and treatment plan.  Each of her questions was answered to her expressed satisfaction.  She is amenable to plan for admission at this time.  Consult to hospitalist as above was agreeable for admission.  Per his recommendation patient has been started on nitroglycerin drip.   This chart was dictated using voice recognition software, Dragon. Despite the best efforts of this provider to proofread and correct errors, errors may still occur which can change documentation meaning.  Final Clinical Impression(s) / ED Diagnoses Final diagnoses:  None    Rx / DC Orders ED Discharge Orders     None         Aura Dials 03/08/21 2233    Truddie Hidden, MD 03/08/21 [id number]

## 2021-03-08 NOTE — ED Notes (Signed)
Patient transported to CT 

## 2021-03-08 NOTE — ED Triage Notes (Signed)
Pt reports waking up Friday with headache, sore throat and cold symptoms. 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

## 2021-03-09 DIAGNOSIS — I10 Essential (primary) hypertension: Secondary | ICD-10-CM

## 2021-03-09 DIAGNOSIS — I169 Hypertensive crisis, unspecified: Secondary | ICD-10-CM

## 2021-03-09 DIAGNOSIS — G4733 Obstructive sleep apnea (adult) (pediatric): Secondary | ICD-10-CM

## 2021-03-09 LAB — CBG MONITORING, ED: Glucose-Capillary: 244 mg/dL — ABNORMAL HIGH (ref 70–99)

## 2021-03-09 LAB — GLUCOSE, CAPILLARY
Glucose-Capillary: 237 mg/dL — ABNORMAL HIGH (ref 70–99)
Glucose-Capillary: 214 mg/dL — ABNORMAL HIGH (ref 70–99)

## 2021-03-09 LAB — TROPONIN I (HIGH SENSITIVITY): Troponin I (High Sensitivity): 17 ng/L (ref <18)

## 2021-03-09 MED ORDER — RIVAROXABAN 10 MG PO TABS
10.0000 mg | ORAL_TABLET | Freq: Every day | ORAL | Status: DC
Start: 2021-03-09 — End: 2021-03-10
  Administered 2021-03-09: 10 mg via ORAL
  Filled 2021-03-09: qty 1

## 2021-03-09 MED ORDER — PROCHLORPERAZINE EDISYLATE 10 MG/2ML IJ SOLN
10.0000 mg | Freq: Once | INTRAMUSCULAR | Status: AC
Start: 2021-03-09 — End: 2021-03-09
  Administered 2021-03-09: 10 mg via INTRAVENOUS
  Filled 2021-03-09: qty 2

## 2021-03-09 MED ORDER — HYDRALAZINE HCL 25 MG PO TABS
50.0000 mg | ORAL_TABLET | Freq: Once | ORAL | Status: AC
Start: 2021-03-09 — End: 2021-03-09
  Administered 2021-03-09: 50 mg via ORAL
  Filled 2021-03-09: qty 2

## 2021-03-09 MED ORDER — INSULIN GLARGINE-YFGN 100 UNIT/ML ~~LOC~~ SOLN
15.0000 [IU] | Freq: Every day | SUBCUTANEOUS | Status: DC
  Administered 2021-03-09: 15 [IU] via SUBCUTANEOUS
  Filled 2021-03-09 (×2): qty 0.15

## 2021-03-09 MED ORDER — ONDANSETRON HCL 4 MG/2ML IJ SOLN: 4.0000 mg | Freq: Four times a day (QID) | INTRAMUSCULAR | Status: DC | PRN

## 2021-03-09 MED ORDER — LABETALOL HCL 5 MG/ML IV SOLN
10.0000 mg | Freq: Once | INTRAVENOUS | Status: AC
  Administered 2021-03-09: 10 mg via INTRAVENOUS
  Filled 2021-03-09: qty 4

## 2021-03-09 MED ORDER — AMLODIPINE-OLMESARTAN 10-40 MG PO TABS: 1.0000 | ORAL_TABLET | Freq: Every day | ORAL | Status: DC

## 2021-03-09 MED ORDER — ONDANSETRON HCL 4 MG PO TABS: 4.0000 mg | ORAL_TABLET | Freq: Four times a day (QID) | ORAL | Status: DC | PRN

## 2021-03-09 MED ORDER — SPIRONOLACTONE 25 MG PO TABS
25.0000 mg | ORAL_TABLET | Freq: Every day | ORAL | Status: DC
  Administered 2021-03-09 – 2021-03-10 (×2): 25 mg via ORAL
  Filled 2021-03-09 (×2): qty 1

## 2021-03-09 MED ORDER — AMLODIPINE BESYLATE 10 MG PO TABS
10.0000 mg | ORAL_TABLET | Freq: Every day | ORAL | Status: DC
Start: 2021-03-09 — End: 2021-03-10
  Administered 2021-03-09 – 2021-03-10 (×2): 10 mg via ORAL
  Filled 2021-03-09 (×2): qty 1

## 2021-03-09 MED ORDER — HYDRALAZINE HCL 25 MG PO TABS
25.0000 mg | ORAL_TABLET | Freq: Four times a day (QID) | ORAL | Status: DC | PRN
  Administered 2021-03-10: 25 mg via ORAL
  Filled 2021-03-09: qty 1

## 2021-03-09 MED ORDER — INSULIN GLARGINE 100 UNIT/ML SOLOSTAR PEN: 15.0000 [IU] | PEN_INJECTOR | Freq: Every day | SUBCUTANEOUS | Status: DC

## 2021-03-09 MED ORDER — IRBESARTAN 300 MG PO TABS
300.0000 mg | ORAL_TABLET | Freq: Every day | ORAL | Status: DC
  Administered 2021-03-09 – 2021-03-10 (×2): 300 mg via ORAL
  Filled 2021-03-09 (×2): qty 1

## 2021-03-09 MED ORDER — CHLORTHALIDONE 25 MG PO TABS
25.0000 mg | ORAL_TABLET | Freq: Every day | ORAL | Status: DC
Start: 2021-03-09 — End: 2021-03-10
  Administered 2021-03-09 – 2021-03-10 (×2): 25 mg via ORAL
  Filled 2021-03-09 (×2): qty 1

## 2021-03-09 MED ORDER — FENTANYL CITRATE PF 50 MCG/ML IJ SOSY
50.0000 ug | PREFILLED_SYRINGE | Freq: Once | INTRAMUSCULAR | Status: AC
  Administered 2021-03-09: 50 ug via INTRAVENOUS
  Filled 2021-03-09: qty 1

## 2021-03-09 MED ORDER — OXYCODONE-ACETAMINOPHEN 5-325 MG PO TABS
1.0000 | ORAL_TABLET | Freq: Once | ORAL | Status: AC
  Administered 2021-03-09: 1 via ORAL
  Filled 2021-03-09: qty 1

## 2021-03-09 NOTE — Assessment & Plan Note (Deleted)
Continue Xarelto 

## 2021-03-09 NOTE — Assessment & Plan Note (Addendum)
Associated chest pressure and elevated troponin on initial presentation which has now resolved with improved control of blood pressure. EKG reviewed and significant for lateral t-wave inversions which are stable from one year prior. Uncontrolled hypertension secondary to medication non-adherence in setting of acute illness. Patient initially managed with nitroglycerin drip which was discontinued. Given a dose of hydralazine 50 mg PO and labetalol 10 mg IV. Restarted home irbesartan, amlodipine, chlorthalidone and spironolactone with improvement of blood pressure.

## 2021-03-09 NOTE — H&P (Signed)
History and Physical    Patient: Whitney [name] OMV:[id number] DOB: 10/01/1985 DOA: 03/08/2021 DOS: the patient was seen and examined on 03/09/2021 PCP: Pcp, No  Patient coming from: Home  Chief Complaint:  Runny nose, headache, body aches, chest pressure   HPI: Whitney [name] is a 36 y.o. female with medical history significant of hypertension, diabetes mellitus, OSA, DVT/PE. Patient reports symptoms started about 4 days prior to admission with worsening symptoms. 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 She reports mildly productive cough, no nausea/vomiting. She reports recent headaches. She reports taking Nyquil severe and Theraflu to help treat her symptoms; Theraflu helped a little bit with her symptoms. She reports likely sick contact from her work. She reports not taking her antihypertensive medication since 2/10.  Review of Systems: As mentioned in the history of present illness. All other systems reviewed and are negative. Past Medical History:  Diagnosis Date   Diabetes mellitus without complication ([place])    type 2   DVT (deep venous thrombosis) ([place]) 03/2018   Gallstones    Headache(784.0)    Hypertension    OSA (obstructive sleep apnea) 05/26/2020   Sleep apnea    does not need cpap   Past Surgical History:  Procedure Laterality Date   CHOLECYSTECTOMY N/A 03/12/2012   Procedure: LAPAROSCOPIC CHOLECYSTECTOMY;  Surgeon: Harl Bowie, MD;  Location: [place] [facility];  Service: General;  Laterality: N/A;   HYSTEROSCOPY WITH D & C N/A 05/11/2020   Procedure: DILATATION AND CURETTAGE /HYSTEROSCOPY;  Surgeon: Sanjuana Kava, MD;  Location: [facility];  Service: Gynecology;  Laterality: N/A;   LAPAROSCOPY N/A 05/11/2020   Procedure: LAPAROSCOPY OPERATIVE; PELVIC WASHINGS;  Surgeon: Sanjuana Kava, MD;  Location: [facility];  Service: Gynecology;  Laterality: N/A;  OVARIAN CYSTECTOMY   Social History:  reports that she quit smoking about 9 years ago. Her smoking use  included cigarettes. She has never used smokeless tobacco. She reports current alcohol use. She reports that she does not use drugs.  No Known Allergies  Family History  Problem Relation Age of Onset   Healthy Mother    Ulcerative colitis Father    Diabetes Maternal Grandmother    Breast cancer Maternal Grandmother    Hypertension Maternal Grandmother    Syncope episode Maternal Grandfather    Benign prostatic hyperplasia Maternal Grandfather    Brain cancer Paternal Grandmother    Lung cancer Paternal Grandfather    Hypertension Son 8   Anesthesia problems Neg Hx    Hypotension Neg Hx    Malignant hyperthermia Neg Hx    Pseudochol deficiency Neg Hx    Colon cancer Neg Hx    Esophageal cancer Neg Hx    Rectal cancer Neg Hx     Prior to Admission medications   Medication Sig Start Date End Date Taking? Authorizing Provider  acetaminophen (TYLENOL CHILDRENS) 160 MG/5ML suspension Take 31.3 mLs (1,000 mg total) by mouth every 8 (eight) hours as needed. 11/04/20  Yes Raspet, Erin K, PA-C  amLODipine-olmesartan (AZOR) 10-40 MG tablet Take 1 tablet by mouth daily. 09/02/20  Yes Skeet Latch, MD  Chlorphen-Pseudoephed-APAP [facility] FLU/COLD PO) Take by mouth.   Yes [provider]  chlorthalidone (HYGROTON) 25 MG tablet Take 1 tablet (25 mg total) by mouth daily. 09/02/20  Yes Skeet Latch, MD  dapagliflozin propanediol (FARXIGA) 10 MG TABS tablet Take 10 mg by mouth daily before breakfast. 03/26/19  Yes Renato Shin, MD  Insulin Glargine (LANTUS SOLOSTAR)  100 UNIT/ML Solostar Pen Inject 15 Units into the skin daily. 02/03/19  Yes Fulp, Cammie, MD  metFORMIN (GLUCOPHAGE-XR) 500 MG 24 hr tablet Take 2 tablets (1,000 mg total) by mouth daily with breakfast. 03/26/19  Yes Renato Shin, MD  Multiple Vitamins-Minerals (HAIR/SKIN/NAILS) TABS Take 1 tablet by mouth daily with breakfast.   Yes [provider]  Phenyleph-Doxylamine-DM-APAP (NYQUIL SEVERE COLD/FLU PO) Take  by mouth.   Yes [provider]  rivaroxaban (XARELTO) 10 MG TABS tablet Take 1 tablet (10 mg total) by mouth daily. 04/25/20  Yes Orson Slick, MD  spironolactone (ALDACTONE) 25 MG tablet Take 1 tablet (25 mg total) by mouth daily. 09/02/20  Yes Skeet Latch, MD  glucose blood [facility] VERIO) test strip 1 each by Other route 2 (two) times daily. And lancets 2/day 03/03/19   Renato Shin, MD  Lancets (ONETOUCH DELICA PLUS [id number]) [place] 1 each by Other route as directed. 03/04/19   [provider]    Physical Exam: Vitals:   03/09/21 1300 03/09/21 1330 03/09/21 1400 03/09/21 1550  BP: (!) 161/88 (!) 171/99 (!) 170/105 (!) 185/124  Pulse: 98 96 96 97  Resp: 20 19 20 14   Temp:      SpO2: 99% 99% 98% 100%  Weight:      Height:       General exam: Appears calm and comfortable Respiratory system: Clear to auscultation. Respiratory effort normal. Cardiovascular system: S1 & S2 heard. S4 heard. RRR. No murmurs, rubs, gallops or clicks. Gastrointestinal system: Abdomen is nondistended, soft and nontender. Normal bowel sounds heard. Central nervous system: Alert and oriented. No focal neurological deficits. Musculoskeletal: No edema. No calf tenderness Skin: No cyanosis. No rashes Psychiatry: Judgement and insight appear normal. Mood & affect appropriate.   Assessment and Plan: * Hypertensive crisis- (present on admission) Associated chest pressure and elevated troponin on initial presentation which has now resolved with improved control of blood pressure. EKG reviewed and significant for lateral t-wave inversions which are stable from one year prior. Uncontrolled hypertension secondary to medication non-adherence in setting of acute illness. Patient initially managed with nitroglycerin drip which was discontinued. Given a dose of hydralazine 50 mg PO and labetalol 10 mg IV. -Restart home irbesartan, amlodipine, chlorthalidone and spironolactone -Continue  telemetry  Pulmonary emboli ([place])- (present on admission) -Continue Xarelto  Obstructive sleep apnea syndrome- (present on admission) Patient is not on CPAP.  Essential hypertension- (present on admission) Please see problem, Hypertensive crisis  Diabetes mellitus ([place]) Patient is on metformin, Lantus and Farxiga as an outpatient. -Continue Lantus -SSI  Morbid obesity ([place])- (present on admission) Body mass index is 46.72 kg/m.    Advance Care Planning: Full Code  Consults: None  Family Communication: None   Author: Cordelia Poche, MD 03/09/2021 4:08 PM  For on call review [email or web address].

## 2021-03-09 NOTE — Assessment & Plan Note (Signed)
Please see problem, Hypertensive crisis

## 2021-03-09 NOTE — Assessment & Plan Note (Addendum)
Patient is on metformin, Lantus and Wilder Glade as an outpatient. Continue on discharge.

## 2021-03-09 NOTE — ED Notes (Signed)
Admitting MD (Dr. Myna Hidalgo) and ER MD made aware that pt is requesting to come off the nitro drip because it is causing her to have an headache. She did state that she is willing to do a pain med but she wants it to last longer than the fentanyl that she received.   Pending new orders

## 2021-03-09 NOTE — Assessment & Plan Note (Signed)
Body mass index is 46.72 kg/m.

## 2021-03-09 NOTE — Assessment & Plan Note (Signed)
Patient is not on CPAP.

## 2021-03-09 NOTE — Assessment & Plan Note (Addendum)
Continue Xarelto 

## 2021-03-10 ENCOUNTER — Encounter (HOSPITAL_COMMUNITY): Payer: Self-pay | Admitting: Family Medicine

## 2021-03-10 LAB — GLUCOSE, CAPILLARY: Glucose-Capillary: 206 mg/dL — ABNORMAL HIGH (ref 70–99)

## 2021-03-10 LAB — HIV ANTIBODY (ROUTINE TESTING W REFLEX): HIV Screen 4th Generation wRfx: NONREACTIVE

## 2021-03-10 NOTE — Discharge Instructions (Signed)
Whitney [name],  You were in the hospital with high blood pressure causing some chest pain. This has resolved with treating your blood pressure. Please follow-up with your cardiologist.

## 2021-03-10 NOTE — Discharge Summary (Signed)
Physician Discharge Summary   Patient: Whitney [name] MRN: [id number] DOB: Oct 03, 1985  Admit date:     03/08/2021  Discharge date: 03/10/21  Discharge Physician: Cordelia Poche, MD   PCP: Pcp, No   Recommendations at discharge:   Follow-up with hypertensive clinic  Discharge Diagnoses: Principal Problem:   Hypertensive crisis Active Problems:   Pulmonary emboli (HCC)   Morbid obesity ([place])   Diabetes mellitus ([place])   Essential hypertension   Obstructive sleep apnea syndrome  Resolved Problems:   * No resolved hospital problems. *   Hospital Course: Whitney [name] is a 36 y.o. female with medical history significant of hypertension, diabetes mellitus, OSA, DVT/PE. Patient presented secondary to runny nose, headache, body aches, chest pressure with evidence of elevated blood pressure and concern for hypertensive emergency. She was started on a nitroglycerin drip which was eventually discontinued secondary to inability to tolerate. Home medications were restarted with improvement of blood pressure.  Assessment and Plan: * Hypertensive crisis- (present on admission) Associated chest pressure and elevated troponin on initial presentation which has now resolved with improved control of blood pressure. EKG reviewed and significant for lateral t-wave inversions which are stable from one year prior. Uncontrolled hypertension secondary to medication non-adherence in setting of acute illness. Patient initially managed with nitroglycerin drip which was discontinued. Given a dose of hydralazine 50 mg PO and labetalol 10 mg IV. Restarted home irbesartan, amlodipine, chlorthalidone and spironolactone with improvement of blood pressure.  Pulmonary emboli ([place])- (present on admission) Continue Xarelto  Obstructive sleep apnea syndrome- (present on admission) Patient is not on CPAP.  Essential hypertension- (present on admission) Please see problem, Hypertensive crisis  Diabetes mellitus  ([place]) Patient is on metformin, Lantus and Farxiga as an outpatient. Continue on discharge.  Morbid obesity ([place])- (present on admission) Body mass index is 46.72 kg/m.           Consultants: None Procedures performed: None  Disposition: Home Diet recommendation:  Cardiac and Carb modified diet  DISCHARGE MEDICATION: Allergies as of 03/10/2021   No Known Allergies      Medication List     TAKE these medications    acetaminophen 160 MG/5ML suspension Commonly known as: Tylenol Childrens Take 31.3 mLs (1,000 mg total) by mouth every 8 (eight) hours as needed.   amLODipine-olmesartan 10-40 MG tablet Commonly known as: AZOR Take 1 tablet by mouth daily.   chlorthalidone 25 MG tablet Commonly known as: HYGROTON Take 1 tablet (25 mg total) by mouth daily.   Farxiga 10 MG Tabs tablet Generic drug: dapagliflozin propanediol Take 10 mg by mouth daily before breakfast.   Hair/Skin/Nails Tabs Take 1 tablet by mouth daily with breakfast.   Lantus SoloStar 100 UNIT/ML Solostar Pen Generic drug: insulin glargine Inject 15 Units into the skin daily.   metFORMIN 500 MG 24 hr tablet Commonly known as: GLUCOPHAGE-XR Take 2 tablets (1,000 mg total) by mouth daily with breakfast.   OneTouch Delica Plus [id number] Misc 1 each by Other route as directed.   OneTouch Verio test strip Generic drug: glucose blood 1 each by Other route 2 (two) times daily. And lancets 2/day   rivaroxaban 10 MG Tabs tablet Commonly known as: XARELTO Take 1 tablet (10 mg total) by mouth daily.   spironolactone 25 MG tablet Commonly known as: ALDACTONE Take 1 tablet (25 mg total) by mouth daily.        Follow-up Information     Skeet Latch, MD Follow up in 1 week(s).  Specialty: Cardiology Why: For hospital follow-up Contact information: [place] [address] [phone]         primary care physician. Schedule an appointment as soon as  possible for a visit.   Contact information: Provided with [place] PCP resource list.                Discharge Exam: Filed Weights   03/08/21 1508 03/09/21 1652  Weight: (!) 152 kg (!) 152 kg   General exam: Appears calm and comfortable  Respiratory system: Clear to auscultation. Respiratory effort normal. Cardiovascular system: S1 & S2 heard, RRR. No murmurs Gastrointestinal system: Abdomen is nondistended, soft and nontender. No organomegaly or masses felt. Normal bowel sounds heard. Central nervous system: Alert and oriented. No focal neurological deficits. Musculoskeletal: No edema. No calf tenderness Skin: No cyanosis. No rashes Psychiatry: Judgement and insight appear normal. Mood & affect appropriate.   Condition at discharge: stable  The results of significant diagnostics from this hospitalization (including imaging, microbiology, ancillary and laboratory) are listed below for reference.   Imaging Studies: CT Head Wo Contrast  Result Date: 03/08/2021 CLINICAL DATA:  Hypertensive emergency EXAM: CT HEAD WITHOUT CONTRAST TECHNIQUE: Contiguous axial images were obtained from the base of the skull through the vertex without intravenous contrast. RADIATION DOSE REDUCTION: This exam was performed according to the departmental dose-optimization program which includes automated exposure control, adjustment of the mA and/or kV according to patient size and/or use of iterative reconstruction technique. COMPARISON:  None. FINDINGS: Brain: No acute intracranial abnormality. Specifically, no hemorrhage, hydrocephalus, mass lesion, acute infarction, or significant intracranial injury. Vascular: No hyperdense vessel or unexpected calcification. Skull: No acute calvarial abnormality. Sinuses/Orbits: No acute findings Other: None IMPRESSION: No acute intracranial abnormality. Electronically Signed   By: Rolm Baptise M.D.   On: 03/08/2021 21:05   DG Chest Portable 1 View  Result Date:  03/08/2021 CLINICAL DATA:  Chest pressure EXAM: PORTABLE CHEST 1 VIEW COMPARISON:  05/27/2018 FINDINGS: The heart size and mediastinal contours are within normal limits. Both lungs are clear. The visualized skeletal structures are unremarkable. IMPRESSION: No active disease. Electronically Signed   By: Franchot Gallo M.D.   On: 03/08/2021 18:35    Microbiology: Results for orders placed or performed during the hospital encounter of 03/08/21  Resp Panel by RT-PCR (Flu A&B, Covid) Nasopharyngeal Swab     Status: None   Collection Time: 03/08/21  3:13 PM   Specimen: Nasopharyngeal Swab; Nasopharyngeal(NP) swabs in vial transport medium  Result Value Ref Range Status   SARS Coronavirus 2 by RT PCR NEGATIVE NEGATIVE Final    Comment: (NOTE) SARS-CoV-2 target nucleic acids are NOT DETECTED.  The SARS-CoV-2 RNA is generally detectable in upper respiratory specimens during the acute phase of infection. The lowest concentration of SARS-CoV-2 viral copies this assay can detect is 138 copies/mL. A negative result does not preclude SARS-Cov-2 infection and should not be used as the sole basis for treatment or other patient management decisions. A negative result may occur with  improper specimen collection/handling, submission of specimen other than nasopharyngeal swab, presence of viral mutation(s) within the areas targeted by this assay, and inadequate number of viral copies(<138 copies/mL). A negative result must be combined with clinical observations, patient history, and epidemiological information. The expected result is Negative.  Fact Sheet for Patients:  EntrepreneurPulse.com.au  Fact Sheet for Healthcare Providers:  IncredibleEmployment.be  This test is no t yet approved or cleared by the [place] and  has been authorized for detection and/or diagnosis of SARS-CoV-2 by FDA under an Emergency Use Authorization (EUA). This EUA will remain  in  effect (meaning this test can be used) for the duration of the COVID-19 declaration under Section 564(b)(1) of the Act, 21 U.S.C.section 360bbb-3(b)(1), unless the authorization is terminated  or revoked sooner.       Influenza A by PCR NEGATIVE NEGATIVE Final   Influenza B by PCR NEGATIVE NEGATIVE Final    Comment: (NOTE) The Xpert Xpress SARS-CoV-2/FLU/RSV plus assay is intended as an aid in the diagnosis of influenza from Nasopharyngeal swab specimens and should not be used as a sole basis for treatment. Nasal washings and aspirates are unacceptable for Xpert Xpress SARS-CoV-2/FLU/RSV testing.  Fact Sheet for Patients: EntrepreneurPulse.com.au  Fact Sheet for Healthcare Providers: IncredibleEmployment.be  This test is not yet approved or cleared by the [place] FDA and has been authorized for detection and/or diagnosis of SARS-CoV-2 by FDA under an Emergency Use Authorization (EUA). This EUA will remain in effect (meaning this test can be used) for the duration of the COVID-19 declaration under Section 564(b)(1) of the Act, 21 U.S.C. section 360bbb-3(b)(1), unless the authorization is terminated or revoked.  Performed at [facility], [address] [place], [place] [address]   Group A Strep by PCR     Status: None   Collection Time: 03/08/21  4:58 PM   Specimen: Throat; Sterile Swab  Result Value Ref Range Status   Group A Strep by PCR NOT DETECTED NOT DETECTED Final    Comment: Performed at [facility], [address], [place], [place] [address]    Labs: CBC: Recent Labs  Lab 03/08/21 1818  WBC 9.7  NEUTROABS 6.1  HGB 14.5  HCT 44.1  MCV 78.2*  PLT 940   Basic Metabolic Panel: Recent Labs  Lab 03/08/21 1818  NA 137  K 3.8  CL 102  CO2 27  GLUCOSE 210*  BUN 10  CREATININE 0.93  CALCIUM 10.5*   Liver Function Tests: No results for input(s): AST, ALT, ALKPHOS,  BILITOT, PROT, ALBUMIN in the last 168 hours. CBG: Recent Labs  Lab 03/09/21 1206 03/09/21 1610 03/09/21 2153 03/10/21 0723  GLUCAP 244* 237* 214* 206*    Signed: Cordelia Poche, MD [facility] 03/10/2021

## 2021-03-10 NOTE — Hospital Course (Signed)
Whitney [name] is a 36 y.o. female with medical history significant of hypertension, diabetes mellitus, OSA, DVT/PE. Patient presented secondary to runny nose, headache, body aches, chest pressure with evidence of elevated blood pressure and concern for hypertensive emergency. She was started on a nitroglycerin drip which was eventually discontinued secondary to inability to tolerate. Home medications were restarted with improvement of blood pressure.

## 2021-03-10 NOTE — Progress Notes (Addendum)
°  Transition of Care [facility]) Screening Note   Patient Details  Name: Whitney [name] Date of Birth: 1985/05/16   Transition of Care [facility]) CM/SW Contact:    Dessa Phi, RN Phone Number: 03/10/2021, 9:40 AM    Transition of Care Department [facility]) has reviewed patient and no TOC needs have been identified at this time. We will continue to monitor patient advancement through interdisciplinary progression rounds. If new patient transition needs arise, please place a TOC consult.  -9:48a-PCP Mercedes ist provided;uninsured resources given. No further CM needs.

## 2021-03-10 NOTE — Progress Notes (Signed)
Pt discharged to home in stable condition accompanied by friend. AVS reviewed; pt denies further questions or concerns at this time. Coolidge Breeze, RN 03/10/2021

## 2021-03-14 ENCOUNTER — Other Ambulatory Visit: Payer: Self-pay

## 2021-03-14 ENCOUNTER — Ambulatory Visit (INDEPENDENT_AMBULATORY_CARE_PROVIDER_SITE_OTHER): Payer: Self-pay

## 2021-03-14 DIAGNOSIS — Z0000 Encounter for general adult medical examination without abnormal findings: Secondary | ICD-10-CM

## 2021-03-14 NOTE — Progress Notes (Signed)
Appointment Outcome: Completed, Session #: [id number]/u Start time: 4:33pm   End time: 5:02pm   Total Mins: 29 minutes  AGREEMENTS SECTION    Overall Goal(s): Stress management Increase physical activity Improve healthy eating habits                                         Agreement/Action Steps Stress management Read bible daily in the mornings Write prayers at bedtime Listen to Pod Casts daily Create daily to-do-list to organize tasks for school, household chores, exercise Setting tasks reminders in phone Utilize support system Bedtime at 11:00 pm nightly    Increase Physical Activity Deep clean and pack one room at a time Increase average step count from 5,000 to 10,000 steps per day   [employer] [place] after 8-8:30pm nightly Meal prep lunch/dinner 2-3 days at a time Take lunch to work to reduce eating out   Progress Notes:  Patient shared that she was doing well implementing all her steps until she became sick and was hospitalized. Patient stated that they are reading the Bible app in the mornings. Patient stated that if she runs out of time in the morning, she will read in the evenings before bedtime. Patient continues to write her prayers at bedtime as well. Patient stated that she wants to start writing her prayers in the morning. Patient mentioned that she listens to Pod Casts daily when she is getting ready for the day.   Patient continues to create a daily to-do-list to organize her tasks, household chores, and exercise. Patient stated that she realized that she is adding more pressure on herself because her lists are not realistic. Patient mentioned that she is getting approximately 50% of her to-do-list completed. Patient stated that she keeps a productivity score at the end of the week. Patient is aiming to complete a minimum of 70% of her to-do-list. Patient shared that she is keeping a mental health journal as well.   Patient has been  utilizing her support system. Patient stated that she enjoys the positive conversations she is having with others.   Patient stated that she has been exercising at home. Patient stated that she will start incorporating the gym into her routine. Patient is currently working out 30 minutes 2-3xs/week in the evenings. Patient stated that because exercise is the last thing she does in the evening, she must force herself to workout. Patient is still in the process of moving and the deep cleaning and packing is still underway. Patient stated that she was able to move some of her belongings last week. Patient reported that she has been able to reach 10,000 steps 2-3xs/week when she wasn't sick. Patient stated on a typical day, she walks 7,000 - 8,000 steps per day.   Patient mentioned that she has not been able to eat before 9:00pm most day. Patient expressed that this causes her to get to bed later than 11:00pm each night in addition to the other tasks that she is trying to complete before bed. Patient reported that she spends a lot of time getting her son prepared for the evening and school the next day. Patient mentioned that she prepares their dinner and lunch in the evenings. Patient stated that she continues to take her lunch to work to avoid eating out.     Indicators of Success and Accountability: Patient's perspective has changes on  how she views stress, is intentional about not allowing stress to overtake her, and paying more attention to her mental health are her indicators of success and accountability.  Readiness: Patient is in the action phase of stress management, increasing physical activity, and improve healthy eating habits.  Strengths and Supports: Patient is being supported by family and friends. Patient stated that her faith and being intentional about making these changes a lifestyle are her strengths.  Challenges and Barriers: Patient does not foresee any challenges to implementing her  action steps over the next 3 months.  Coaching Outcomes: Patient stated that she is going to work on her sleeping and waking schedule so that she can get more done in the morning.  Patient stated that she wants to start waking up between as early as 5:00am each morning to aid in getting more accomplished during the day. Patient would like to establish a routine of drinking green tea in the morning, meditating for 10 minutes, and reading for 30 minutes. Patient wants to be able to incorporate this routine without feeling rushed in the mornings.   Patient will continue to implement her action steps as outlined above over the next 3 months.   Attempted: Fulfilled - Patient has been able to read her Bible, write her prayers, listen to Pod Casts, create daily to-do list, utilize her support system, deep clean and pack, meal prep, and take lunch to work as outlined.  Partial - Patient continues to work towards increasing her steps and has been able to reach 10,000 steps 2-3xs/week with an average of 7,000 - 8,000 steps daily.  Not met - Patient has not been able to get to bed by 11:00pm each nightly. Patient has not been able to eat by 8:30pm nightly.

## 2021-03-15 ENCOUNTER — Telehealth: Payer: Self-pay | Admitting: Licensed Clinical Social Worker

## 2021-03-15 ENCOUNTER — Telehealth: Payer: Self-pay

## 2021-03-15 DIAGNOSIS — Z0000 Encounter for general adult medical examination without abnormal findings: Secondary | ICD-10-CM

## 2021-03-15 NOTE — Telephone Encounter (Signed)
Received new referral for pt from Amy, Health Coach, regarding pt lack of insurance. Health Coach had previously referred pt to this Probation officer. I attempted her on 9/8 and 10/04/20, I was able to reach her on 9/13 and discuss needs. I mailed her information about Medicaid enrollment, [employer], [employer], Farxiga and Xarelto PAP. I then attempted to reach her again on three separate occasions via call and text (9/27, 9/29, and 10/25/20). Did not receive any additional f/u from pt at that time.   Noted pt still without insurance, called [facility] and pt has not filled medications since 5/22 with them. Also no showed appt with pharmacy so may need to get on schedule with them.  Attempted pt again today via call at [phone]. No answer, left voicemail.   I f/u with Health Coach and pharmacy about medication compliance concerns.   Westley Hummer, MSW, [employer]  [phone]- work cell phone (preferred) [phone]- desk phone

## 2021-03-15 NOTE — Telephone Encounter (Signed)
LCSW attempted pt again today at 4:30pm to see if able to reach her. Per health coach this is a better time. No answer, additional message left with my contact information.   I have made health coach aware in case she speaks with pt prior to me, that pt needs to reschedule appts with pharmacy, Dr. Oval Linsey, and that we can assist with items requested by health coach. I made her aware that pt has not filled medications since May 2022 (she lost her job in roughly Sept 2022 per my previous notes which has made these challenges greater), I will mail applications and my card tomorrow. I remain available.  Westley Hummer, MSW, [place]  [phone]- work cell phone (preferred) [phone]- desk phone

## 2021-03-15 NOTE — Telephone Encounter (Signed)
Was able to reach patient to inform her that [place], [place], tried to contact her and left messages regarding assistance with resources. Also informed the patient that she needs to schedule appointments with the pharmacy and Dr. Oval Linsey for follows to qualify for further assistance. Patient verbally expressed understanding and stated that she will reach out to [facility] [facility] soon.   Khai Arrona [name], [facility] Guide, Health Coach [address] [place] [address] Telephone: [phone] Email: [email or web address]

## 2021-03-16 ENCOUNTER — Telehealth: Payer: Self-pay | Admitting: Licensed Clinical Social Worker

## 2021-03-16 NOTE — Telephone Encounter (Signed)
The following was reviewed in person today with Health Coach and mailed to pt current address: - instructions for applying for/being screened for Medicaid by DSS  - if ineligible for Medicaid, flyer for [place] who can assist pt with potential eligibility for special enrollment period w/ [employer]  - if ineligible for Medicaid, pt can apply for [employer] , application and instructions   - if ineligible for Medicaid and [employer], pt can apply for [employer], application and instructions  - information for pt to re-establish with PCP either w/ [facility] or alternate clinic  - Xarelto (J&J) and Farxiga (AZ&Me) patient assistance applications with instructions on how to complete and who to bring them back to for provider portion (Dr. Lorenso Courier and Dr. Loanne Drilling respectively)  Pt has been encouraged to call and reschedule her appointments with Dr. Oval Linsey and pharmacy team. I remain available but would like for pt to re-establish with medical provider prior to continued assistance to ensure she is receiving appropriate care. My card and contact number was left with applications. I have left two voicemail messages with my number for pt and Health Coach has also requested pt return my calls.    Westley Hummer, MSW, [place]  [phone]- work cell phone (preferred) [phone]- desk phone

## 2021-06-03 ENCOUNTER — Ambulatory Visit (INDEPENDENT_AMBULATORY_CARE_PROVIDER_SITE_OTHER): Payer: Self-pay

## 2021-06-03 ENCOUNTER — Ambulatory Visit (HOSPITAL_BASED_OUTPATIENT_CLINIC_OR_DEPARTMENT_OTHER): Payer: Self-pay

## 2021-06-03 DIAGNOSIS — Z0000 Encounter for general adult medical examination without abnormal findings: Secondary | ICD-10-CM

## 2021-06-03 NOTE — Progress Notes (Signed)
Appointment Outcome: Completed, Session # 76-monthf/u ?Start time: 3:02pm   End time: 3:40pm   Total Mins: 38 minutes ? ?AGREEMENTS SECTION ? ? ?Overall Goal(s): ?Stress management ?Increase physical activity ?Improve healthy eating habits                                       ?  ?Agreement/Action Steps ?Stress management ?Read bible daily in the mornings ?Write prayers at bedtime ?Listen to Pod Casts daily ?Create daily to-do-list to organize tasks for school, household chores, exercise ?Setting tasks reminders in phone ?Utilize support system ?Bedtime at 11:00 pm nightly ?  ? Increase Physical Activity ?Deep clean and pack one room at a time ?Increase average step count from 5,000 to 10,000 steps per day ?  ?Improving Health Eating Habits ?Don't eat after 8-8:30pm nightly ?Meal prep lunch/dinner 2-3 days at a time ?Take lunch to work to reduce eating out ? ?Progress Notes:  ?Patient stated that she has been reading the Bible in the mornings mostly, but if her schedule does not permit for her to read first thing in the morning, she will read before bedtime. Patient stated that she writes her prayers in the morning and at bedtime. Patient stated that she continues to listen to Pod Casts daily, where she starts in the morning and listen to it throughout the day. Patient shared that she is listening to a few self-care, spiritual, and inspirational Pod Casts.  ? ?Patient mentioned that she still creates daily to-do list to organize tasks. Patient stated that instead of setting reminders in her phone, she writes in her journal where she can track her weekly progress. Patient shared that she did deep clean and packed up her previous residence before moving in March. Patient stated that she has moved back into her mom's house, and she has been a great support.  ? ?Patient stated that she has been working on increasing her step count per day. Patient stated that since she has been walking, going to the gym, and has a  second job, she is able to walk 10,000 steps or more. Patient stated that she normally walks approximately 12,000-13,000 steps per day. Patient stated that her mom or dad watch her son while she exercises. Patient stated that she works out in the gym 4-5 days per week for at least 1.5 hrs each time. Patient stated that she is tired due to not being able to get to bed by 11:00pm consistently, so she implements positive self-talk to motivate her to workout. Patient stated that she will be working on adjusting her schedule to be more flexible around her second jobs so she can get to bed by 11:00pm nightly like she was before getting her second job.  ? ?Patient stated that she has been meal planning/prepping throughout the week to reduce eating out at work. Patient stated that if she didn't cook, she would bring tuna packets with crackers or yogurt with granola. Patient stated that it was difficult at times because her coworkers would eat fast food in front of her. Patient stated that she has eating out some this week for lunch. Patient reported that she has two green smoothies per day, one for breakfast and one for dinner. Patient stated that she adds protein powder that helps her stay fuller longer. Patient stated that she does not eat while she is at her second job because she is  busy and not a lot of healthy options to choose from.  ? ?Patient shared that she did the green smoothie cleanse during April. Patient stated that she is going to do another cleanse starting Tuesday. Patient stated that she has lost 2.25 inches off her waist. Patient stated that she feels lighter since she has been working out and her resting heart rate has come down. Patient shared that she has been able to fit in a pair of jeans that she hasn't been able to in some time. Patient is excited about the progress that she has made.  ? ?Patient stated that she has been working hard to change her mindset and is dedicated to not stressing and to  feeling better. Patient mentioned that she attended a Bible study group for college students and young professionals that was helpful and make a huge difference in managing stress. Patient recognized that she wasn't seeing the bright side of things while going through difficult moments. 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  ? ? ?Indicators of Success and Accountability:  Patient has been able to increase and maintain her physical activity and reduce her stress.  ?Readiness: Patient is in the action phase of stress management, increasing physical activity, and improving healthy eating habits.  ?Strengths and Supports: Patient is being supported by her mom and [employer]. Patient is relying on her faith and being disciplined.  ?Challenges and Barriers: Patient does not foresee any challenges to implementing and maintaining her action steps over the next 35-month.  ? ?Coaching Outcomes: ?Informed patient that she has completed the VMoragaprogram and that she can return the device at her earliest convenience to the [facility] or [facility] office. Patient stated that she will return the cuff next week on a day she is off in the evening.  ? ?Patient is to be scheduled for her 665-monthollow up around November 13th. Patient will be called prior to scheduling appointment when appointments open.  ? ?Patient will continue to implement her action steps as outlined above over the next 6 months with no changes.  ? ?Attempted: ?Fulfilled - Patient has been able to maintain reading the Bible, writing prayers, listen to Pod Cast daily, creating daily to-do-list, utilizing support system, deep cleaned and packed, increased average steps count to 10,000 steps, doesn't eat after 8:30pm, meal prep lunch/dinner and take lunch to work. ?Partial - Patient has not been able to get to bed consistently at 11:00pm each night.  ? ? ? ? ?

## 2021-10-26 NOTE — Progress Notes (Signed)
Schedule patient's 86-monthf/u session over the phone for health coaching as informed during 353-month/u appointment.

## 2021-11-03 ENCOUNTER — Encounter (HOSPITAL_BASED_OUTPATIENT_CLINIC_OR_DEPARTMENT_OTHER): Payer: Self-pay | Admitting: Emergency Medicine

## 2021-11-03 ENCOUNTER — Ambulatory Visit (HOSPITAL_COMMUNITY)
Admission: EM | Admit: 2021-11-03 | Discharge: 2021-11-03 | Disposition: A | Discharge status: Home / Self Care | Payer: Self-pay | Attending: Sports Medicine | Admitting: Sports Medicine

## 2021-11-03 ENCOUNTER — Emergency Department (HOSPITAL_BASED_OUTPATIENT_CLINIC_OR_DEPARTMENT_OTHER): Payer: Self-pay

## 2021-11-03 ENCOUNTER — Emergency Department (HOSPITAL_BASED_OUTPATIENT_CLINIC_OR_DEPARTMENT_OTHER)
Admission: EM | Admit: 2021-11-03 | Discharge: 2021-11-04 | Disposition: A | Discharge status: Home / Self Care | Payer: Self-pay | Attending: Emergency Medicine | Admitting: Emergency Medicine

## 2021-11-03 ENCOUNTER — Other Ambulatory Visit: Payer: Self-pay

## 2021-11-03 DIAGNOSIS — Z3202 Encounter for pregnancy test, result negative: Secondary | ICD-10-CM

## 2021-11-03 DIAGNOSIS — Z794 Long term (current) use of insulin: Secondary | ICD-10-CM | POA: Insufficient documentation

## 2021-11-03 DIAGNOSIS — R350 Frequency of micturition: Secondary | ICD-10-CM | POA: Insufficient documentation

## 2021-11-03 DIAGNOSIS — I160 Hypertensive urgency: Secondary | ICD-10-CM

## 2021-11-03 DIAGNOSIS — Z7901 Long term (current) use of anticoagulants: Secondary | ICD-10-CM | POA: Insufficient documentation

## 2021-11-03 DIAGNOSIS — Z7984 Long term (current) use of oral hypoglycemic drugs: Secondary | ICD-10-CM | POA: Insufficient documentation

## 2021-11-03 DIAGNOSIS — R102 Pelvic and perineal pain: Secondary | ICD-10-CM | POA: Insufficient documentation

## 2021-11-03 DIAGNOSIS — Z79899 Other long term (current) drug therapy: Secondary | ICD-10-CM | POA: Insufficient documentation

## 2021-11-03 DIAGNOSIS — R10819 Abdominal tenderness, unspecified site: Secondary | ICD-10-CM

## 2021-11-03 DIAGNOSIS — I10 Essential (primary) hypertension: Secondary | ICD-10-CM | POA: Insufficient documentation

## 2021-11-03 DIAGNOSIS — N83202 Unspecified ovarian cyst, left side: Secondary | ICD-10-CM | POA: Insufficient documentation

## 2021-11-03 LAB — COMPREHENSIVE METABOLIC PANEL
ALT: 17 U/L (ref 0–44)
AST: 19 U/L (ref 15–41)
Albumin: 4 g/dL (ref 3.5–5.0)
Alkaline Phosphatase: 54 U/L (ref 38–126)
Anion gap: 8 (ref 5–15)
BUN: 11 mg/dL (ref 6–20)
CO2: 27 mmol/L (ref 22–32)
Calcium: 9.6 mg/dL (ref 8.9–10.3)
Chloride: 102 mmol/L (ref 98–111)
Creatinine, Ser: 0.94 mg/dL (ref 0.44–1.00)
GFR, Estimated: >60 mL/min (ref >60)
Glucose, Bld: 155 mg/dL — ABNORMAL HIGH (ref 70–99)
Potassium: 3.8 mmol/L (ref 3.5–5.1)
Sodium: 137 mmol/L (ref 135–145)
Total Bilirubin: 0.5 mg/dL (ref 0.3–1.2)
Total Protein: 8 g/dL (ref 6.5–8.1)

## 2021-11-03 LAB — POCT URINALYSIS DIPSTICK, ED / UC
Bilirubin Urine: NEGATIVE
Glucose, UA: NEGATIVE mg/dL
Ketones, ur: NEGATIVE mg/dL
Leukocytes,Ua: NEGATIVE
Nitrite: NEGATIVE
Protein, ur: 30 mg/dL — AB
Specific Gravity, Urine: 1.02 (ref 1.005–1.030)
Urobilinogen, UA: 1 mg/dL (ref 0.0–1.0)
pH: 6 (ref 5.0–8.0)

## 2021-11-03 LAB — URINALYSIS, ROUTINE W REFLEX MICROSCOPIC
Bilirubin Urine: NEGATIVE
Glucose, UA: NEGATIVE mg/dL
Ketones, ur: NEGATIVE mg/dL
Leukocytes,Ua: NEGATIVE
Nitrite: NEGATIVE
Specific Gravity, Urine: 1.019 (ref 1.005–1.030)
pH: 5.5 (ref 5.0–8.0)

## 2021-11-03 LAB — WET PREP, GENITAL
Clue Cells Wet Prep HPF POC: NONE SEEN
Sperm: NONE SEEN
Trich, Wet Prep: NONE SEEN
WBC, Wet Prep HPF POC: <10 (ref <10)
Yeast Wet Prep HPF POC: NONE SEEN

## 2021-11-03 LAB — CBC
HCT: 40.3 % (ref 36.0–46.0)
Hemoglobin: 13.6 g/dL (ref 12.0–15.0)
MCH: 26.7 pg (ref 26.0–34.0)
MCHC: 33.7 g/dL (ref 30.0–36.0)
MCV: 79.2 fL — ABNORMAL LOW (ref 80.0–100.0)
Platelets: 284 10*3/uL (ref 150–400)
RBC: 5.09 MIL/uL (ref 3.87–5.11)
RDW: 13 % (ref 11.5–15.5)
WBC: 7.6 10*3/uL (ref 4.0–10.5)
nRBC: 0 % (ref 0.0–0.2)

## 2021-11-03 LAB — POC URINE PREG, ED: Preg Test, Ur: NEGATIVE

## 2021-11-03 LAB — LIPASE, BLOOD: Lipase: 68 U/L — ABNORMAL HIGH (ref 11–51)

## 2021-11-03 LAB — PREGNANCY, URINE: Preg Test, Ur: NEGATIVE

## 2021-11-03 MED ORDER — HYDRALAZINE HCL 20 MG/ML IJ SOLN
10.0000 mg | Freq: Once | INTRAMUSCULAR | Status: AC
  Administered 2021-11-03: 10 mg via INTRAVENOUS
  Filled 2021-11-03: qty 1

## 2021-11-03 MED ORDER — CLONIDINE HCL 0.1 MG PO TABS
0.1000 mg | ORAL_TABLET | Freq: Once | ORAL | Status: AC
Start: 2021-11-03 — End: 2021-11-03
  Administered 2021-11-03: 0.1 mg via ORAL
  Filled 2021-11-03: qty 1

## 2021-11-03 MED ORDER — CLONIDINE HCL 0.1 MG PO TABS
0.1000 mg | ORAL_TABLET | Freq: Once | ORAL | Status: AC
  Administered 2021-11-03: 0.1 mg via ORAL
  Filled 2021-11-03: qty 1

## 2021-11-03 NOTE — Discharge Instructions (Signed)
Make sure that you take your blood pressure medications regularly.  You need to have close follow-up with her primary care doctor.  I have also given you referral to cardiology that you can follow-up with regarding your blood pressure.  You can call tomorrow to make an appointment.  Return to the emergency room if you have any worsening symptoms.

## 2021-11-03 NOTE — ED Triage Notes (Signed)
Pt arrives to ED with c/o abdominal pain. The abd pain is lower abdomen/ pelvic area that started x4-5 days ago. She does endorse urinary urgency. Pt also with elevated BP at UC today, she does not take ber BP meds regularly.

## 2021-11-03 NOTE — ED Provider Notes (Signed)
[place] [facility]    CSN: [id number] Arrival date & time: 11/03/21  1054      History   Chief Complaint No chief complaint on file.   HPI Whitney [name] is a 36 y.o. female.   Whitney [name] presents today with chief complaint of lower abdominal pain for the past 2 weeks, worse when she is urinating.  She had some discomfort earlier this year however that seemed to resolve.  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  She thought that her urinary frequency was secondary to that.  She reports she is having urinary frequency and lower pelvic discomfort.  She denies any chest pain, shortness of breath, headaches or blurry vision, nausea, vomiting, fevers, chills, hematuria or burning when she pees.  She reports she has irregular periods, states her period is starting today, but denies any chance of pregnancy.  Of note she does have diabetes, not very compliant with medications as she has trouble swallowing pills and no primary care provider.     Past Medical History:  Diagnosis Date   Diabetes mellitus without complication ([place])    type 2   DVT (deep venous thrombosis) ([place]) 03/2018   Gallstones    Headache(784.0)    Hypertension    OSA (obstructive sleep apnea) 05/26/2020   Sleep apnea    does not need cpap    Patient Active Problem List   Diagnosis Date Noted   Hypertensive crisis 03/08/2021   Ovarian cyst 02/16/2020   Obstructive sleep apnea syndrome 85/63/1497   Non-alcoholic fatty liver disease 01/02/2020   Positive antinuclear antibody 01/02/2020   Essential hypertension 11/24/2019   Menorrhagia 11/24/2019   Acute deep vein thrombosis (DVT) of popliteal vein of left lower extremity ([place])    Pulmonary emboli ([place]) 04/13/2018   Diabetes mellitus ([place]) 01/17/2016   Morbid obesity ([place]) 12/27/2015    Past Surgical History:  Procedure Laterality Date   CHOLECYSTECTOMY N/A 03/12/2012   Procedure: LAPAROSCOPIC CHOLECYSTECTOMY;  Surgeon: Harl Bowie,  MD;  Location: [facility];  Service: General;  Laterality: N/A;   HYSTEROSCOPY WITH D & C N/A 05/11/2020   Procedure: DILATATION AND CURETTAGE /HYSTEROSCOPY;  Surgeon: Sanjuana Kava, MD;  Location: [facility];  Service: Gynecology;  Laterality: N/A;   LAPAROSCOPY N/A 05/11/2020   Procedure: LAPAROSCOPY OPERATIVE; PELVIC WASHINGS;  Surgeon: Sanjuana Kava, MD;  Location: [facility];  Service: Gynecology;  Laterality: N/A;  OVARIAN CYSTECTOMY    OB History     Gravida  1   Para  1   Term  1   Preterm  0   AB  0   Living  1      SAB  0   IAB  0   Ectopic  0   Multiple  0   Live Births  1            Home Medications    Prior to Admission medications   Medication Sig Start Date End Date Taking? Authorizing Provider  acetaminophen (TYLENOL CHILDRENS) 160 MG/5ML suspension Take 31.3 mLs (1,000 mg total) by mouth every 8 (eight) hours as needed. 11/04/20   Raspet, Derry Skill, PA-C  amLODipine-olmesartan (AZOR) 10-40 MG tablet Take 1 tablet by mouth daily. 09/02/20   Skeet Latch, MD  chlorthalidone (HYGROTON) 25 MG tablet Take 1 tablet (25 mg total) by mouth daily. 09/02/20   Skeet Latch, MD  dapagliflozin propanediol (FARXIGA) 10 MG TABS tablet Take 10 mg by mouth daily before breakfast.  03/26/19   Renato Shin, MD  glucose blood [facility] VERIO) test strip 1 each by Other route 2 (two) times daily. And lancets 2/day 03/03/19   Renato Shin, MD  Insulin Glargine (LANTUS SOLOSTAR) 100 UNIT/ML Solostar Pen Inject 15 Units into the skin daily. 02/03/19   Fulp, Ander Gaster, MD  Lancets (ONETOUCH DELICA PLUS [id number]) [place] 1 each by Other route as directed. 03/04/19   [provider]  metFORMIN (GLUCOPHAGE-XR) 500 MG 24 hr tablet Take 2 tablets (1,000 mg total) by mouth daily with breakfast. 03/26/19   Renato Shin, MD  Multiple Vitamins-Minerals (HAIR/SKIN/NAILS) TABS Take 1 tablet by mouth daily with breakfast.    [provider]  rivaroxaban (XARELTO) 10 MG TABS tablet Take 1 tablet  (10 mg total) by mouth daily. 04/25/20   Orson Slick, MD  spironolactone (ALDACTONE) 25 MG tablet Take 1 tablet (25 mg total) by mouth daily. 09/02/20   Skeet Latch, MD    Family History Family History  Problem Relation Age of Onset   Healthy Mother    Ulcerative colitis Father    Diabetes Maternal Grandmother    Breast cancer Maternal Grandmother    Hypertension Maternal Grandmother    Syncope episode Maternal Grandfather    Benign prostatic hyperplasia Maternal Grandfather    Brain cancer Paternal Grandmother    Lung cancer Paternal Grandfather    Hypertension Son 8   Anesthesia problems Neg Hx    Hypotension Neg Hx    Malignant hyperthermia Neg Hx    Pseudochol deficiency Neg Hx    Colon cancer Neg Hx    Esophageal cancer Neg Hx    Rectal cancer Neg Hx     Social History Social History   Tobacco Use   Smoking status: Former    Types: Cigarettes    Quit date: 05/24/2011    Years since quitting: 10.4   Smokeless tobacco: Never  Vaping Use   Vaping Use: Never used  Substance Use Topics   Alcohol use: Yes    Comment: Once a month on average   Drug use: No     Allergies   Patient has no known allergies.   Review of Systems Review of Systems as listed above in HPI   Physical Exam Triage Vital Signs ED Triage Vitals [11/03/21 1150]  Enc Vitals Group     BP (!) 211/143     Pulse Rate (!) 103     Resp 16     Temp 98.4 F (36.9 C)     Temp Source Oral     SpO2 98 %     Weight      Height      Head Circumference      Peak Flow      Pain Score      Pain Loc      Pain Edu?      Excl. in [place]?    No data found.  Updated Vital Signs BP (!) 195/148 (BP Location: Left Arm)   Pulse (!) 103   Temp 98.4 F (36.9 C) (Oral)   Resp 16   SpO2 98%     Physical Exam Vitals reviewed.  Constitutional:      General: She is not in acute distress.    Appearance: She is obese. She is not ill-appearing, toxic-appearing or diaphoretic.  Eyes:     Pupils:  Pupils are equal, round, and reactive to light.  Cardiovascular:     Rate and Rhythm: Tachycardia present.  Pulses: Normal pulses.  Pulmonary:     Effort: Pulmonary effort is normal.  Abdominal:     General: There is no distension.     Tenderness: There is abdominal tenderness (Right and left lower quadrant and suprapubic). There is no guarding or rebound.  Skin:    General: Skin is warm.  Neurological:     Mental Status: She is alert.  Psychiatric:        Mood and Affect: Mood normal.        Behavior: Behavior normal.        Thought Content: Thought content normal.        Judgment: Judgment normal.     UC Treatments / Results  Labs (all labs ordered are listed, but only abnormal results are displayed) Labs Reviewed  POCT URINALYSIS DIPSTICK, ED / UC - Abnormal; Notable for the following components:      Result Value   Hgb urine dipstick LARGE (*)    Protein, ur 30 (*)    All other components within normal limits  POC URINE PREG, ED  POCT URINALYSIS DIPSTICK, ED / UC  POC URINE PREG, ED    EKG   Radiology No results found.  Procedures Procedures (including critical care time)  Medications Ordered in UC Medications - No data to display  Initial Impression / Assessment and Plan / UC Course  I have reviewed the triage vital signs and the nursing notes.  Pertinent labs & imaging results that were available during my care of the patient were reviewed by me and considered in my medical decision making (see chart for details).     Patient was here today for lower abdominal pain, urine pregnancy test negative.  Urinalysis significant for hemoglobin and protein. Patient is found to be very hypertensive during her visit today, with proteinuria.  Of note she was treated in February of this year for hypertensive urgency in the ER.  Patient reports she last took her medicines on Tuesday.  She has difficulty swallowing pills therefore she crushes them but they have a bad  aftertaste to make her feel nauseous.  Patient does not have a primary care provider as she does not have insurance at this time.  Phone number for community health clinic was given to her today.  Patient's systolic blood pressure did slightly improve on recheck however her diastolic remained greatly elevated greater than 145.  In light of proteinuria with greatly elevated blood pressure I recommended she be evaluated in the ER for hypertensive urgency versus emergency.  Patient was discharged to the ER.  Patient declined transport and will drive herself.  She verbalized understanding and severity of her condition. Final Clinical Impressions(s) / UC Diagnoses   Final diagnoses:  Hypertensive urgency     Discharge Instructions      You were seen in the ER today for lower abdominal pain and urinary frequency.  Your urinalysis here was negative for any signs of infection in your urine, negative leukocytes and nitrites.  There was signs of blood, likely from your.  But also protein which could be a sign of kidney damage.  Your blood pressure at time of arrival was 211/143.  Your recheck value was 195/148.  I recommended ER evaluation, you should immediately drive to the ER. I have also provided information about a primary care provider in your paperwork you are to follow-up with after the ER.     ED Prescriptions   None    PDMP not reviewed this  encounter.   Elmore Guise, DO 11/03/21 1231

## 2021-11-03 NOTE — ED Triage Notes (Signed)
Pt reports abdominal pain and cramping x 2 weeks.

## 2021-11-03 NOTE — ED Provider Notes (Signed)
[facility] EMERGENCY DEPT Provider Note   CSN: [id number] Arrival date & time: 11/03/21  1318     History  Chief Complaint  Patient presents with   Abdominal Pain   Hypertension    Whitney [name] is a 36 y.o. female.  Patient is a 36 year old who presents with lower abdominal pain.  She states she has had some urinary symptoms for the last several months which include urgency and urinary frequency.  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  She has to go the bathroom multiple times during the night which is affecting her sleep.  She also endorses some lower abdominal pain which has been going on the last 4 to 5 days.  She denies any vaginal discharge.  She is noted to be markedly hypertensive.  She does have a known history of hypertension but says that she does not take her medications regularly.  She states that she has a hard time swallowing pills and when she crushes them she does not like the taste.  She does not currently have a primary care doctor.  She was recently admitted in February for hypertensive urgency/emergency.  She was given follow-up with cardiology but she said she did not go there because she does not have insurance and did not have the money to pay for it.  She was given information about signing up for Medicaid but she did not pursue this  She said that she was seen in urgent care today before being sent here for further evaluation given her hypertension and proteinuria  She was given information about applying for Medicaid again which she states she is going to do.  She does not have any nausea or vomiting.  No chest pain or shortness of breath.  No change in her stools.  No headaches or dizziness.       Home Medications Prior to Admission medications   Medication Sig Start Date End Date Taking? Authorizing Provider  acetaminophen (TYLENOL CHILDRENS) 160 MG/5ML  suspension Take 31.3 mLs (1,000 mg total) by mouth every 8 (eight) hours as needed. 11/04/20   Raspet, Derry Skill, PA-C  amLODipine-olmesartan (AZOR) 10-40 MG tablet Take 1 tablet by mouth daily. 09/02/20   Skeet Latch, MD  chlorthalidone (HYGROTON) 25 MG tablet Take 1 tablet (25 mg total) by mouth daily. 09/02/20   Skeet Latch, MD  dapagliflozin propanediol (FARXIGA) 10 MG TABS tablet Take 10 mg by mouth daily before breakfast. 03/26/19   Renato Shin, MD  glucose blood (ONETOUCH VERIO) test strip 1 each by Other route 2 (two) times daily. And lancets 2/day 03/03/19   Renato Shin, MD  Insulin Glargine (LANTUS SOLOSTAR) 100 UNIT/ML Solostar Pen Inject 15 Units into the skin daily. 02/03/19   Fulp, Ander Gaster, MD  Lancets (ONETOUCH DELICA PLUS [id number]) [place] 1 each by Other route as directed. 03/04/19   [provider]  metFORMIN (GLUCOPHAGE-XR) 500 MG 24 hr tablet Take 2 tablets (1,000 mg total) by mouth daily with breakfast. 03/26/19   Renato Shin, MD  Multiple Vitamins-Minerals (HAIR/SKIN/NAILS) TABS Take 1 tablet by mouth daily with breakfast.    [provider]  rivaroxaban (XARELTO) 10 MG TABS tablet Take 1 tablet (10 mg total) by mouth daily. 04/25/20   Orson Slick, MD  spironolactone (ALDACTONE) 25 MG tablet Take 1 tablet (25 mg total) by mouth daily. 09/02/20   Skeet Latch, MD  Allergies    Patient has no known allergies.    Review of Systems   Review of Systems  Constitutional:  Negative for chills, diaphoresis, fatigue and fever.  HENT:  Negative for congestion, rhinorrhea and sneezing.   Eyes: Negative.   Respiratory:  Negative for cough, chest tightness and shortness of breath.   Cardiovascular:  Negative for chest pain and leg swelling.  Gastrointestinal:  Positive for abdominal pain. Negative for blood in stool, diarrhea, nausea and vomiting.  Genitourinary:  Positive for frequency and urgency. Negative for difficulty urinating, flank pain and  hematuria.  Musculoskeletal:  Negative for arthralgias and back pain.  Skin:  Negative for rash.  Neurological:  Negative for dizziness, speech difficulty, weakness, numbness and headaches.    Physical Exam Updated Vital Signs BP (!) 191/115   Pulse 95   Temp 98.8 F (37.1 C) (Oral)   Resp (!) 25   Ht '5\' 11"'$  (1.803 m)   Wt (!) 150.6 kg   SpO2 100%   BMI 46.30 kg/m  Physical Exam Constitutional:      Appearance: She is well-developed. She is obese.  HENT:     Head: Normocephalic and atraumatic.  Eyes:     Pupils: Pupils are equal, round, and reactive to light.  Cardiovascular:     Rate and Rhythm: Normal rate and regular rhythm.     Heart sounds: Normal heart sounds.  Pulmonary:     Effort: Pulmonary effort is normal. No respiratory distress.     Breath sounds: Normal breath sounds. No wheezing or rales.  Chest:     Chest wall: No tenderness.  Abdominal:     General: Bowel sounds are normal.     Palpations: Abdomen is soft.     Tenderness: There is abdominal tenderness (Mild tenderness in the suprapubic area). There is no guarding or rebound.  Musculoskeletal:        General: Normal range of motion.     Cervical back: Normal range of motion and neck supple.  Lymphadenopathy:     Cervical: No cervical adenopathy.  Skin:    General: Skin is warm and dry.     Findings: No rash.  Neurological:     General: No focal deficit present.     Mental Status: She is alert and oriented to person, place, and time.     ED Results / Procedures / Treatments   Labs (all labs ordered are listed, but only abnormal results are displayed) Labs Reviewed  LIPASE, BLOOD - Abnormal; Notable for the following components:      Result Value   Lipase 68 (*)    All other components within normal limits  COMPREHENSIVE METABOLIC PANEL - Abnormal; Notable for the following components:   Glucose, Bld 155 (*)    All other components within normal limits  CBC - Abnormal; Notable for the  following components:   MCV 79.2 (*)    All other components within normal limits  URINALYSIS, ROUTINE W REFLEX MICROSCOPIC - Abnormal; Notable for the following components:   Hgb urine dipstick LARGE (*)    Protein, ur TRACE (*)    All other components within normal limits  WET PREP, GENITAL  PREGNANCY, URINE  GC/CHLAMYDIA PROBE AMP ([place]) NOT AT [facility]    EKG EKG Interpretation  Date/Time:  Thursday November 03 2021 17:11:41 EDT Ventricular Rate:  83 PR Interval:  206 QRS Duration: 94 QT Interval:  404 QTC Calculation: 475 R Axis:   -11 Text Interpretation: Sinus  rhythm Borderline prolonged PR interval Consider right atrial enlargement Abnormal lateral Q waves Anterior infarct, old Abnormal T, consider ischemia, diffuse leads since last tracing no significant change Confirmed by Malvin Johns [id number]) on 11/03/2021 5:14:57 PM  Radiology US PELVIC COMPLETE W TRANSVAGINAL AND TORSION R/O  Result Date: 11/03/2021 CLINICAL DATA:  Pelvic pain EXAM: TRANSABDOMINAL AND TRANSVAGINAL ULTRASOUND OF PELVIS DOPPLER ULTRASOUND OF OVARIES TECHNIQUE: Both transabdominal and transvaginal ultrasound examinations of the pelvis were performed. Transabdominal technique was performed for global imaging of the pelvis including uterus, ovaries, adnexal regions, and pelvic cul-de-sac. It was necessary to proceed with endovaginal exam following the transabdominal exam to visualize the uterus endometrium adnexa. Color and duplex Doppler ultrasound was utilized to evaluate blood flow to the ovaries. COMPARISON:  CT 02/17/2020 FINDINGS: Uterus Measurements: 9.6 x 5 x 5.5 cm = volume: 138.7 mL. No fibroids or other mass visualized. Endometrium Thickness: 2.4 mm.  No focal abnormality visualized. Right ovary Not seen. Left ovary Measurements: 5.7 x 4.1 x 4.1 cm = volume: 50.6 mL. Cyst measuring 3.4 x 3 x 2.8 cm. Pulsed Doppler evaluation of left ovary demonstrates normal low-resistance arterial and venous  waveforms. Other findings Trace free fluid IMPRESSION: 1. Nonvisualized right ovary 2. No evidence for left ovarian torsion. 3.4 cm left ovarian cyst. No follow up imaging recommended. Note: This recommendation does not apply to premenarchal patients or to those with increased risk (genetic, family history, elevated tumor markers or other high-risk factors) of ovarian cancer. Reference: Radiology 2019 Nov; 293(2):359-371. 3. Trace free fluid in the pelvis Electronically Signed   By: Donavan Foil M.D.   On: 11/03/2021 20:51    Procedures Procedures    Medications Ordered in ED Medications  cloNIDine (CATAPRES) tablet 0.1 mg (0.1 mg Oral Given 11/03/21 1641)  cloNIDine (CATAPRES) tablet 0.1 mg (0.1 mg Oral Given 11/03/21 1937)  hydrALAZINE (APRESOLINE) injection 10 mg (10 mg Intravenous Given 11/03/21 2141)  hydrALAZINE (APRESOLINE) injection 10 mg (10 mg Intravenous Given 11/03/21 2311)    ED Course/ Medical Decision Making/ A&P                           Medical Decision Making Problems Addressed: Cyst of left ovary: acute illness or injury Primary hypertension: acute illness or injury that poses a threat to life or bodily functions  Amount and/or Complexity of Data Reviewed External Data Reviewed: notes. Labs: ordered. Decision-making details documented in ED Course. Radiology: ordered and independent interpretation performed. Decision-making details documented in ED Course. ECG/medicine tests: ordered and independent interpretation performed. Decision-making details documented in ED Course.  Risk Prescription drug management. Decision regarding hospitalization.   Patient is a 36 year old female who presents primarily with pelvic pain and urinary frequency.  Her urine does not appear consistent with infection although she does have some proteinuria.  Pelvic exam was unremarkable.  Pelvic ultrasound did show evidence of a left ovarian cyst.  This could be the etiology for her  discomfort.  She does not have symptoms that would be more concerning for torsion.  I do not have a clear source for her urinary frequency.  Labs are obtained.  Her creatinine at this point is normal.  1 big overall concern is her markedly elevated blood pressure.  She has a history of noncompliance with her medications.  Her blood pressure was markedly elevated on arrival.  She was initially given some clonidine which did bring it down a little bit.  She was also  given dose of hydralazine.  It still came down a little bit and I wanted to observe her for a bit longer but she does not want to wait any longer for observation and wants to go home.  She is completely asymptomatic as far as her blood pressure goes.  She does not have any concerns for stroke.  No significant headache.  No chest pain or concerns for ischemic heart disease.  No evidence of endorgan damage.  I had a long discussion with her about the importance of taking her medications and that she likely at some point will have a stroke or develop kidney failure if she continues to not take her medications.  She does report that she will start taking her medications.  She states that she has prescriptions at home to take.  She has gotten information about signing up for Medicaid and is going to pursue this.  I did give her a referral to follow back up with cardiology as she did not follow-up with them after her last hospitalization.  She was discharged home in good condition.  She was given strict return precautions.  Final Clinical Impression(s) / ED Diagnoses Final diagnoses:  Primary hypertension  Cyst of left ovary    Rx / DC Orders ED Discharge Orders     None         Malvin Johns, MD 11/03/21 2325

## 2021-11-03 NOTE — Discharge Instructions (Addendum)
You were seen in the ER today for lower abdominal pain and urinary frequency.  Your urinalysis here was negative for any signs of infection in your urine, negative leukocytes and nitrites.  There was signs of blood, likely from your.  But also protein which could be a sign of kidney damage.  Your blood pressure at time of arrival was 211/143.  Your recheck value was 195/148.  I recommended ER evaluation, you should immediately drive to the ER. I have also provided information about a primary care provider in your paperwork you are to follow-up with after the ER.

## 2021-11-04 LAB — GC/CHLAMYDIA PROBE AMP (~~LOC~~) NOT AT ARMC
Chlamydia: NEGATIVE
Comment: NEGATIVE
Comment: NORMAL
Neisseria Gonorrhea: NEGATIVE

## 2021-12-05 ENCOUNTER — Ambulatory Visit: Payer: Self-pay

## 2021-12-05 DIAGNOSIS — Z0000 Encounter for general adult medical examination without abnormal findings: Secondary | ICD-10-CM

## 2021-12-05 NOTE — Progress Notes (Signed)
Appointment Outcome:  Completed, Session #: 34-monthf/u                         Start time: 3:03pm   End time: 3:37pm   Total Mins: 34 minutes  AGREEMENTS SECTION   Overall Goal(s): Stress management Increase physical activity Improve healthy eating habits                                         Agreement/Action Steps Stress management Read bible daily in the mornings Write prayers at bedtime Listen to Pod Casts daily Create daily to-do-list to organize tasks for school, household chores, exercise Setting tasks reminders in phone Utilize support system Bedtime at 11:00 pm nightly   Increase Physical Activity Deep clean and pack one room at a time Increase average step count from 5,000 to 10,000 steps per day   [place] after 8-8:30pm nightly Meal prep lunch/dinner 2-3 days at a time Take lunch to work to reduce eating out    Progress Notes:  Patient reported that she was able to maintain her steps/routine for the remainder of last school year but was not able to do so when she started a second job for the summer. Patient stated that her physical activity has been going well over the past 6 months due to her walking on both jobs. Patient mentioned that she was tracking her steps regularly and it was easy for her to get over 10,000 steps in a day. Patient shared that her Fitbit stopped working about 2 months ago and she doesn't keep her phone on her as much to track her steps adequately. Patient stated that during the summer she was able to lose 20 lbs. but gain 10 lbs. recently.   Patient stated that she is not able to get to bed at a set time due to her second job. Patient stated that she tries to be sleep between 12-1am. Patient shared that due to getting home late and times that she doesn't eat at work, she must eat after 8:30pm. Patient stated that she eats regardless of the time to keep from having headaches in the morning. Patient mentioned  that she is buying food from work for dinner and lunch the next day.   Patient desires to get up earlier in the morning to restart her morning routine between 5:30-6am. Patient stated that it would be perfect if she would be able to exercise for an hour before 5am before coming home to get her son ready for school, instead of the burden being on her mother. Patient stated that her parents and brother are her supporters and helps a lot with her son since she is working two jobs.   Patient shared that her stress level was a 4.5/10 and that she's not stressed but feels more unorganized and deprived from sleep. Patient stated that she needs to get back to writing down her schedule and to-to-list again because she had gotten away from because she feels as though she is going with the flow. Patient feels this will help her be more conscious of time.   Patient suggested that she uses her day off on Saturday or after/before work on Sundays to reset for the week (e.g., getting their clothes out for the week), which she believes will save her time for other things. Patient mentioned  that one of her challenges outside of sleep is not having the energy/desire to do anything during her free time besides rest from being exhausted.   Patient stated that since the school year has begun, she finds that she has some structure, but not as much flexibility to engage in all her steps. Patient expressed the need to get back into her routine because she can tell a difference. Patient stated that she wants to get back to reading her Bible in the morning before work. Patient stated that she would write her prayers, but it wouldn't be in the morning because she would have to find time during the day to write at work.    Indicators of Success and Accountability:  Patient has been able to maintain her physical activity of walking an average of 10,000 steps per day.  Readiness: Patient is in the action stage of stress management,  increasing physical activity, and improving healthy eating habits.  Strengths and Supports: Patient is being supported by her family. Patient is relying on her strengths with organizing/planning and being flexible.  Challenges and Barriers: Patient is currently working two jobs and time management is a challenge to implementing her action steps.    Coaching Outcomes: Patient revised her action steps that she will implement over the next month as outlined below:  Overall Goal(s): Stress management Increase physical activity Improve healthy eating habits                                         Agreement/Action Steps Stress management Create daily to-do-list to organize/schedule routine, tasks, and exercise Setting tasks reminders in phone Get up when alarm goes off at 5:30-6am Read bible daily in the mornings after waking up Write prayers during down time at work Utilize support system Bedtime at 12:00am nightly    Increase Physical Activity Increase average step count from 5,000 to 10,000 steps per day while working   [place] a new time to eat dinner before bedtime   Patient was mailed a copy of her revised action steps.    Attempted: Fulfilled - Patient continues to walk at both jobs to get an average 10,000 steps in each day.  Partial - Patient was able to engage in all her steps as outlined above for approximately 3 months.

## 2022-01-09 NOTE — Progress Notes (Signed)
Patient sent email to reschedule appointment originally set for 12/22 at 3:00pm due to a change in her work schedule. Patient provided her availability and has been rescheduled for 12/19 at 4:30pm. Patient confirmed the rescheduled appointment via email.    Whitney [name] [name] Guide, Health Coach [address], [address] [place] [address] Telephone: [phone] Email: [name]

## 2022-01-10 ENCOUNTER — Ambulatory Visit: Payer: Self-pay | Attending: Cardiology

## 2022-01-10 DIAGNOSIS — Z0000 Encounter for general adult medical examination without abnormal findings: Secondary | ICD-10-CM

## 2022-01-10 NOTE — Progress Notes (Unsigned)
Appointment Outcome: Completed, Session #: Final f/u                       Start time: 4:30pm   End time: 5:02pm   Total Mins: 32 minutes  AGREEMENTS SECTION   Overall Goal(s): Stress management Increase physical activity Improve healthy eating habits                                         Agreement/Action Steps Stress management Create daily to-do-list to organize/schedule routine, tasks, and exercise Setting tasks reminders in phone Get up when alarm goes off at 5:30-6am Read bible daily in the mornings after waking up Write prayers during down time at work Utilize support system Bedtime at 12:00am nightly    Increase Physical Activity Increase average step count from 5,000 to 10,000 steps per day while working   [place] Set a new time to eat dinner before bedtime   Progress Notes:  Patient reported that her stress level is a 3/10 only because of her job and dreading to job search again, but not because she is stressed about other aspects of her life. Patient stated that finances was a major stressor for her and that her income has improved some, which has reduced her stress. Patient mentioned that she will be looking for another job that the pay is consistent. Patient stated that she has been evaluating where she is currently in different domains of her life and has been identify solutions and strategies to manage her stress. Patient expressed that before a situation becomes a stressor, she works towards making changes before she becomes stressed.   Patient stated that she is currently working on establishing routines to help implement additional activities into her schedule such as going to the gym on her days off. Patient plans to work on implementing exercise into her routine after the new year. Patient shared that her Fitbit broke and has not been able to accurately monitor her daily step count with her phone due to not having the phone on her personally  all day.  Patient stated that due to her work schedule, she has been challenged with getting to bed by 12am consistently, which makes it difficult to get up when the alarm rings between 5:30-6am. Patient stated that when she gets home from work, she typically engage in a wind down from 11-11:30pm. Patient shared that after relaxing, she gets prepared for the next day before going to bed and is able to get to bed some nights by 12am. Patient stated that she typically eat after her shift is over between 9-9:30pm. Patient stated that she would like to be able to eat before 8pm.  Patient stated that she is planning on setting a time over the weekend to organize her wardrobe for the upcoming week to save her time after work and in the morning when getting prepared for the day. Patient stated that with not having to spend much time getting prepared for the next day after work, she would be able to get to bed earlier so she can rest more and be able to get up as scheduled. Patient shared that with the extra time in the morning, she would like to focus on reading the Bible and writing her prayers before work instead of finding time at work to engage in these activities.  Patient mentioned  that she still creates a daily to-do-list that consists of 8 tasks. Patient stated that she tries to complete as much as possible but has been able to reduce her stress by being flexible in the time frame that she completes a task. Patient stated that this approach serves as a reminder and accountability to be consistent. Patient shared that she is giving herself grace and not being hard on herself when she does not complete all 8 tasks each day. Patient stated that she is pacing herself and finding ways to navigate changes in her life.   Patient stated that she has a great support system that helps her stay positive. Patient shared that she has learned that it is okay to receive help from others when it is a tool to help yourself.  Patient mentioned that she also affirms herself and what she has been able to accomplish.  Indicators of Success and Accountability: Patient has been able to reduce her stress with a new perception on how to cope with stress.  Readiness: Patient has completed the health coaching program. Strengths and Supports: Patient has a support system that she can rely on. Patient relies on being organized, analytical and a planner, optimistic. Patient is empowered by her faith. Challenges and Barriers: Patient's work schedule has been a challenge to implementing some of her action steps.   Coaching Outcomes: Patient states that with the support system that she has and the progress that she has made, she will be able to maintain her action steps, identify solutions to challenges, and be flexible with her strategies to maintain her goals.   Patient has completed the health coaching program successfully and no additional appointments are needed.    Attempted: Fulfilled - Patient has been able to create daily to-do-list to organize/schedule routine, tasks, and exercise, write prayers during down time at work, and utilize support system  Partial - Patient was sometimes able to get up when alarm goes off at 5:30-6am, read bible daily in the mornings after waking up, go to bed at 12am. Patient is unsure how if she was able to increase her steps from 5,000 to 10,000 per day because her Fitbit broke recently. Patient was not able to eat dinner consistently before 8pm during workdays.

## 2022-01-13 ENCOUNTER — Ambulatory Visit: Payer: Self-pay

## 2022-05-04 ENCOUNTER — Ambulatory Visit
Admission: EM | Admit: 2022-05-04 | Discharge: 2022-05-04 | Disposition: A | Discharge status: Home / Self Care | Payer: Medicaid Other | Attending: Physician Assistant | Admitting: Physician Assistant

## 2022-05-04 DIAGNOSIS — E1169 Type 2 diabetes mellitus with other specified complication: Secondary | ICD-10-CM

## 2022-05-04 DIAGNOSIS — E119 Type 2 diabetes mellitus without complications: Secondary | ICD-10-CM | POA: Diagnosis not present

## 2022-05-04 DIAGNOSIS — I10 Essential (primary) hypertension: Secondary | ICD-10-CM | POA: Diagnosis not present

## 2022-05-04 DIAGNOSIS — Z794 Long term (current) use of insulin: Secondary | ICD-10-CM

## 2022-05-04 LAB — POCT FASTING CBG KUC MANUAL ENTRY: POCT Glucose (KUC): 283 mg/dL — AB (ref 70–99)

## 2022-05-04 MED ORDER — LANTUS SOLOSTAR 100 UNIT/ML ~~LOC~~ SOPN
15.0000 [IU] | PEN_INJECTOR | Freq: Every day | SUBCUTANEOUS | 1 refills | Status: DC
Start: 2022-05-04 — End: 2023-09-02

## 2022-05-04 MED ORDER — ONETOUCH DELICA PLUS LANCET33G MISC: 1.0000 | 1 refills | Status: DC

## 2022-05-04 MED ORDER — RIVAROXABAN 10 MG PO TABS: 10.0000 mg | ORAL_TABLET | Freq: Every day | ORAL | 2 refills | Status: DC

## 2022-05-04 MED ORDER — ONETOUCH VERIO VI STRP: 1.0000 | ORAL_STRIP | Freq: Two times a day (BID) | 12 refills | Status: DC

## 2022-05-04 MED ORDER — METFORMIN HCL ER 500 MG PO TB24: 1000.0000 mg | ORAL_TABLET | Freq: Every day | ORAL | 3 refills | Status: DC

## 2022-05-04 MED ORDER — ONDANSETRON 4 MG PO TBDP
4.0000 mg | ORAL_TABLET | Freq: Once | ORAL | Status: AC
  Administered 2022-05-04: 4 mg via ORAL

## 2022-05-04 MED ORDER — METOPROLOL TARTRATE 50 MG PO TABS
50.0000 mg | ORAL_TABLET | Freq: Once | ORAL | Status: AC
  Administered 2022-05-04: 50 mg via ORAL

## 2022-05-04 MED ORDER — METOPROLOL TARTRATE 25 MG PO TABS: 25.0000 mg | ORAL_TABLET | Freq: Once | ORAL | Status: DC

## 2022-05-04 MED ORDER — CHLORTHALIDONE 25 MG PO TABS: 25.0000 mg | ORAL_TABLET | Freq: Every day | ORAL | 6 refills | Status: DC

## 2022-05-04 MED ORDER — DAPAGLIFLOZIN PROPANEDIOL 10 MG PO TABS: 10.0000 mg | ORAL_TABLET | Freq: Every day | ORAL | 11 refills | Status: DC

## 2022-05-04 MED ORDER — AMLODIPINE-OLMESARTAN 10-40 MG PO TABS: 1.0000 | ORAL_TABLET | Freq: Every day | ORAL | 6 refills | Status: DC

## 2022-05-04 NOTE — ED Triage Notes (Signed)
Pt presents with c/o vomiting X last night. States she tried eating crackers and was then vomiting yellow and c/o green stool. States she has been drinking fluids and reports she started having a cough.

## 2022-05-04 NOTE — Discharge Instructions (Signed)
Return if any problems.

## 2022-05-04 NOTE — ED Provider Notes (Signed)
[facility]    CSN: [id number] Arrival date & time: 05/04/22  1109      History   Chief Complaint Chief Complaint  Patient presents with   Vomiting   Cough    HPI Whitney [name] is a 37 y.o. female.   The history is provided by the patient. No language interpreter was used.  Cough Cough characteristics:  Non-productive Sputum characteristics:  Nondescript Severity:  Moderate Onset quality:  Gradual Timing:  Constant Progression:  Worsening Chronicity:  New Smoker: no   Relieved by:  Nothing Worsened by:  Nothing Ineffective treatments:  None tried Associated symptoms: no sinus congestion   Risk factors: no recent infection     Past Medical History:  Diagnosis Date   Diabetes mellitus without complication    type 2   DVT (deep venous thrombosis) 03/2018   Gallstones    Headache(784.0)    Hypertension    OSA (obstructive sleep apnea) 05/26/2020   Sleep apnea    does not need cpap    Patient Active Problem List   Diagnosis Date Noted   Hypertensive crisis 03/08/2021   Ovarian cyst 02/16/2020   Obstructive sleep apnea syndrome 01/02/2020   Non-alcoholic fatty liver disease 01/02/2020   Positive antinuclear antibody 01/02/2020   Essential hypertension 11/24/2019   Menorrhagia 11/24/2019   Acute deep vein thrombosis (DVT) of popliteal vein of left lower extremity    Pulmonary emboli 04/13/2018   Diabetes mellitus 01/17/2016   Morbid obesity 12/27/2015    Past Surgical History:  Procedure Laterality Date   CHOLECYSTECTOMY N/A 03/12/2012   Procedure: LAPAROSCOPIC CHOLECYSTECTOMY;  Surgeon: Shelly Rubenstein, MD;  Location: [facility] OR;  Service: General;  Laterality: N/A;   HYSTEROSCOPY WITH D & C N/A 05/11/2020   Procedure: DILATATION AND CURETTAGE /HYSTEROSCOPY;  Surgeon: Essie Hart, MD;  Location: [facility] OR;  Service: Gynecology;  Laterality: N/A;   LAPAROSCOPY N/A 05/11/2020   Procedure: LAPAROSCOPY OPERATIVE; PELVIC WASHINGS;  Surgeon: Essie Hart,  MD;  Location: [facility] OR;  Service: Gynecology;  Laterality: N/A;  OVARIAN CYSTECTOMY    OB History     Gravida  1   Para  1   Term  1   Preterm  0   AB  0   Living  1      SAB  0   IAB  0   Ectopic  0   Multiple  0   Live Births  1            Home Medications    Prior to Admission medications   Medication Sig Start Date End Date Taking? Authorizing Provider  acetaminophen (TYLENOL CHILDRENS) 160 MG/5ML suspension Take 31.3 mLs (1,000 mg total) by mouth every 8 (eight) hours as needed. 11/04/20   Raspet, Noberto Retort, PA-C  amLODipine-olmesartan (AZOR) 10-40 MG tablet Take 1 tablet by mouth daily. 05/04/22   Elson Areas, PA-C  chlorthalidone (HYGROTON) 25 MG tablet Take 1 tablet (25 mg total) by mouth daily. 05/04/22   Elson Areas, PA-C  dapagliflozin propanediol (FARXIGA) 10 MG TABS tablet Take 1 tablet (10 mg total) by mouth daily before breakfast. 05/04/22   Elson Areas, PA-C  glucose blood (ONETOUCH VERIO) test strip 1 each by Other route 2 (two) times daily. And lancets 2/day 05/04/22   Elson Areas, PA-C  insulin glargine (LANTUS SOLOSTAR) 100 UNIT/ML Solostar Pen Inject 15 Units into the skin daily. 05/04/22   Elson Areas, PA-C  Lancets [facility]  PLUS LANCET33G) MISC 1 each by Other route as directed. 05/04/22   Elson Areas, PA-C  metFORMIN (GLUCOPHAGE-XR) 500 MG 24 hr tablet Take 2 tablets (1,000 mg total) by mouth daily with breakfast. 05/04/22   Elson Areas, PA-C  Multiple Vitamins-Minerals (HAIR/SKIN/NAILS) TABS Take 1 tablet by mouth daily with breakfast.    [provider]  rivaroxaban (XARELTO) 10 MG TABS tablet Take 1 tablet (10 mg total) by mouth daily. 05/04/22   Elson Areas, PA-C    Family History Family History  Problem Relation Age of Onset   Healthy Mother    Ulcerative colitis Father    Diabetes Maternal Grandmother    Breast cancer Maternal Grandmother    Hypertension Maternal Grandmother    Syncope episode  Maternal Grandfather    Benign prostatic hyperplasia Maternal Grandfather    Brain cancer Paternal Grandmother    Lung cancer Paternal Grandfather    Hypertension Son 8   Anesthesia problems Neg Hx    Hypotension Neg Hx    Malignant hyperthermia Neg Hx    Pseudochol deficiency Neg Hx    Colon cancer Neg Hx    Esophageal cancer Neg Hx    Rectal cancer Neg Hx     Social History Social History   Tobacco Use   Smoking status: Former    Types: Cigarettes    Quit date: 05/24/2011    Years since quitting: 10.9   Smokeless tobacco: Never  Vaping Use   Vaping Use: Never used  Substance Use Topics   Alcohol use: Yes    Comment: Once a month on average   Drug use: No     Allergies   Patient has no known allergies.   Review of Systems Review of Systems  Respiratory:  Positive for cough.   Gastrointestinal:  Positive for vomiting.  All other systems reviewed and are negative.    Physical Exam Triage Vital Signs ED Triage Vitals  Enc Vitals Group     BP 05/04/22 1211 (!) 221/128     Pulse Rate 05/04/22 1211 96     Resp 05/04/22 1211 18     Temp 05/04/22 1211 98.4 F (36.9 C)     Temp Source 05/04/22 1211 Oral     SpO2 05/04/22 1211 95 %     Weight --      Height --      Head Circumference --      Peak Flow --      Pain Score 05/04/22 1210 0     Pain Loc --      Pain Edu? --      Excl. in GC? --    No data found.  Updated Vital Signs BP (!) 190/123   Pulse 96   Temp 98.4 F (36.9 C) (Oral)   Resp 18   LMP 04/07/2022 (Exact Date)   SpO2 95%   Visual Acuity Right Eye Distance:   Left Eye Distance:   Bilateral Distance:    Right Eye Near:   Left Eye Near:    Bilateral Near:     Physical Exam Vitals and nursing note reviewed.  Constitutional:      Appearance: Normal appearance. She is well-developed.  HENT:     Head: Normocephalic.  Cardiovascular:     Rate and Rhythm: Normal rate.  Pulmonary:     Effort: Pulmonary effort is normal.  Abdominal:      General: Abdomen is flat. There is no distension.  Musculoskeletal:  General: Normal range of motion.     Cervical back: Normal range of motion.  Skin:    General: Skin is warm.  Neurological:     General: No focal deficit present.     Mental Status: She is alert and oriented to person, place, and time.  Psychiatric:        Mood and Affect: Mood normal.      UC Treatments / Results  Labs (all labs ordered are listed, but only abnormal results are displayed) Labs Reviewed  POCT FASTING CBG [facility] MANUAL ENTRY - Abnormal; Notable for the following components:      Result Value   POCT Glucose ([facility]) 283 (*)    All other components within normal limits    EKG   Radiology No results found.  Procedures Procedures (including critical care time)  Medications Ordered in UC Medications  ondansetron (ZOFRAN-ODT) disintegrating tablet 4 mg (4 mg Oral Given 05/04/22 1240)  metoprolol tartrate (LOPRESSOR) tablet 50 mg (50 mg Oral Given 05/04/22 1241)    Initial Impression / Assessment and Plan / UC Course  I have reviewed the triage vital signs and the nursing notes.  Pertinent labs & imaging results that were available during my care of the patient were reviewed by me and considered in my medical decision making (see chart for details).    MDM: Patient has not been taking her diabetes medicine her blood pressure medicine or Eliquis.  Patient has a history of a pulmonary embolus a DVT hypertension and diabetes.  Patient reports that she has trouble swallowing pills so she does not take her medications.  Patient reports her headache started yesterday and she thinks it is because her blood pressure is high.  Patient is given metoprolol 50 mg here as her blood pressure is 221/128.  Patient given Zofran.  Patient has decreased nausea patient's blood pressure decreased to 164/120. I had a serious conversation with patient about her medical problems and lack of treatment I have  advised her that she is high risk for stroke heart attack and complications from diabetes occluding renal failure and other organ damage.  Patient is advised to schedule an appointment with primary care for evaluation I refilled all of her prescriptions I discussed with her at length the need for improving her diet weight loss and compliance with her medications..  Final Clinical Impressions(s) / UC Diagnoses   Final diagnoses:  Essential hypertension  Type 2 diabetes mellitus with other specified complication, with long-term current use of insulin     Discharge Instructions      Return if any problems.     ED Prescriptions     Medication Sig Dispense Auth. Provider   amLODipine-olmesartan (AZOR) 10-40 MG tablet Take 1 tablet by mouth daily. 30 tablet Ashima Shrake K, [place]-C   chlorthalidone (HYGROTON) 25 MG tablet Take 1 tablet (25 mg total) by mouth daily. 30 tablet Cheron SchaumannSofia, Shakeema Lippman K, [place]-C   dapagliflozin propanediol (FARXIGA) 10 MG TABS tablet Take 1 tablet (10 mg total) by mouth daily before breakfast. 30 tablet Peighton Edgin K, PA-C   glucose blood (ONETOUCH VERIO) test strip 1 each by Other route 2 (two) times daily. And lancets 2/day 100 each Brynlee Pennywell K, PA-C   insulin glargine (LANTUS SOLOSTAR) 100 UNIT/ML Solostar Pen Inject 15 Units into the skin daily. 15 mL Cheron SchaumannSofia, Anwar Sakata K, PA-C   Lancets [facility](ONETOUCH DELICA PLUS [place]33G) MISC 1 each by Other route as directed. 1 each Elson AreasSofia, Innocence Schlotzhauer K, PA-C  metFORMIN (GLUCOPHAGE-XR) 500 MG 24 hr tablet Take 2 tablets (1,000 mg total) by mouth daily with breakfast. 180 tablet Kanoe Wanner K, PA-C   rivaroxaban (XARELTO) 10 MG TABS tablet Take 1 tablet (10 mg total) by mouth daily. 90 tablet Elson Areas, [place]      PDMP not reviewed this encounter. An After Visit Summary was printed and given to the patient.    Elson Areas, [place] 05/04/22 1402

## 2022-05-05 ENCOUNTER — Telehealth: Payer: Medicaid Other | Admitting: Emergency Medicine

## 2022-05-05 DIAGNOSIS — J069 Acute upper respiratory infection, unspecified: Secondary | ICD-10-CM

## 2022-05-05 DIAGNOSIS — R109 Unspecified abdominal pain: Secondary | ICD-10-CM

## 2022-05-05 NOTE — Patient Instructions (Signed)
  Whitney [name], thank you for joining Roxy Horseman, PA-C for today's virtual visit.  While this provider is not your primary care provider (PCP), if your PCP is located in our provider database this encounter information will be shared with them immediately following your visit.   A [facility] MyChart account gives you access to today's visit and all your visits, tests, and labs performed at [facility] " click here if you don't have a [facility] MyChart account or go to [email or web address]  Consent: (Patient) Whitney [name] provided verbal consent for this virtual visit at the beginning of the encounter.  Current Medications:  Current Outpatient Medications:    acetaminophen (TYLENOL CHILDRENS) 160 MG/5ML suspension, Take 31.3 mLs (1,000 mg total) by mouth every 8 (eight) hours as needed., Disp: 1000 mL, Rfl: 0   amLODipine-olmesartan (AZOR) 10-40 MG tablet, Take 1 tablet by mouth daily., Disp: 30 tablet, Rfl: 6   chlorthalidone (HYGROTON) 25 MG tablet, Take 1 tablet (25 mg total) by mouth daily., Disp: 30 tablet, Rfl: 6   dapagliflozin propanediol (FARXIGA) 10 MG TABS tablet, Take 1 tablet (10 mg total) by mouth daily before breakfast., Disp: 30 tablet, Rfl: 11   glucose blood (ONETOUCH VERIO) test strip, 1 each by Other route 2 (two) times daily. And lancets 2/day, Disp: 100 each, Rfl: 12   insulin glargine (LANTUS SOLOSTAR) 100 UNIT/ML Solostar Pen, Inject 15 Units into the skin daily., Disp: 15 mL, Rfl: 1   Lancets (ONETOUCH DELICA PLUS LANCET33G) MISC, 1 each by Other route as directed., Disp: 1 each, Rfl: 1   metFORMIN (GLUCOPHAGE-XR) 500 MG 24 hr tablet, Take 2 tablets (1,000 mg total) by mouth daily with breakfast., Disp: 180 tablet, Rfl: 3   Multiple Vitamins-Minerals (HAIR/SKIN/NAILS) TABS, Take 1 tablet by mouth daily with breakfast., Disp: , Rfl:    rivaroxaban (XARELTO) 10 MG TABS tablet, Take 1 tablet (10 mg total) by mouth daily., Disp: 90 tablet, Rfl: 2    Medications ordered in this encounter:  No orders of the defined types were placed in this encounter.    *If you need refills on other medications prior to your next appointment, please contact your pharmacy*  Follow-Up: Call back or seek an in-person evaluation if the symptoms worsen or if the condition fails to improve as anticipated.  [facility] [phone]  Other Instructions IF your symptoms worsen, please be seen in person.   If you have been instructed to have an in-person evaluation today at a local Urgent Care facility, please use the link below. It will take you to a list of all of our available [facility], including address, phone number and hours of operation. Please do not delay care.  [facility]  If you or a family member do not have a primary care provider, use the link below to schedule a visit and establish care. When you choose a [facility] primary care physician or advanced practice provider, you gain a long-term partner in health. Find a Primary Care Provider  Learn more about [facility]'s in-office and virtual care options: [facility] - Get Care Now

## 2022-05-05 NOTE — Progress Notes (Signed)
Virtual Visit Consent   Whitney [name], you are scheduled for a virtual visit with a [facility] provider today. Just as with appointments in the office, your consent must be obtained to participate. Your consent will be active for this visit and any virtual visit you may have with one of our providers in the next 365 days. If you have a MyChart account, a copy of this consent can be sent to you electronically.  As this is a virtual visit, video technology does not allow for your provider to perform a traditional examination. This may limit your provider's ability to fully assess your condition. If your provider identifies any concerns that need to be evaluated in person or the need to arrange testing (such as labs, EKG, etc.), we will make arrangements to do so. Although advances in technology are sophisticated, we cannot ensure that it will always work on either your end or our end. If the connection with a video visit is poor, the visit may have to be switched to a telephone visit. With either a video or telephone visit, we are not always able to ensure that we have a secure connection.  By engaging in this virtual visit, you consent to the provision of healthcare and authorize for your insurance to be billed (if applicable) for the services provided during this visit. Depending on your insurance coverage, you may receive a charge related to this service.  I need to obtain your verbal consent now. Are you willing to proceed with your visit today? Whitney [name] has provided verbal consent on 05/05/2022 for a virtual visit (video or telephone). Whitney Horseman, PA-C  Date: 05/05/2022 11:14 AM  Virtual Visit via Video Note   I, Whitney [name], connected with  Whitney [name]  ([id number], 02/09/1985) on 05/05/22 at 11:15 AM EDT by a video-enabled telemedicine application and verified that I am speaking with the correct person using two identifiers.  Location: Patient: Virtual Visit Location Patient:  Home Provider: Virtual Visit Location Provider: Home Office   I discussed the limitations of evaluation and management by telemedicine and the availability of in person appointments. The patient expressed understanding and agreed to proceed.    History of Present Illness: Whitney [name] is a 37 y.o. who identifies as a female who was assigned female at birth, and is being seen today for lower abdominal and back pain.  Reports pain in the back and abdomen when coughing.  States that she had nausea and vomiting yesterday and was seen at urgent care.  States that she was told it might viral.  States that she is concerned because she is having worsening abdominal pain.  Reports prior cholecystectomy and had a cyst removed.  She denies fever or chills.  Overall, patient states that she feels like she is improving compared to yesterday except for runny nose.Marland Kitchen  HPI: HPI  Problems:  Patient Active Problem List   Diagnosis Date Noted   Hypertensive crisis 03/08/2021   Ovarian cyst 02/16/2020   Obstructive sleep apnea syndrome 01/02/2020   Non-alcoholic fatty liver disease 01/02/2020   Positive antinuclear antibody 01/02/2020   Essential hypertension 11/24/2019   Menorrhagia 11/24/2019   Acute deep vein thrombosis (DVT) of popliteal vein of left lower extremity    Pulmonary emboli 04/13/2018   Diabetes mellitus 01/17/2016   Morbid obesity 12/27/2015    Allergies: No Known Allergies Medications:  Current Outpatient Medications:    acetaminophen (TYLENOL CHILDRENS) 160 MG/5ML suspension, Take 31.3 mLs (1,000 mg  total) by mouth every 8 (eight) hours as needed., Disp: 1000 mL, Rfl: 0   amLODipine-olmesartan (AZOR) 10-40 MG tablet, Take 1 tablet by mouth daily., Disp: 30 tablet, Rfl: 6   chlorthalidone (HYGROTON) 25 MG tablet, Take 1 tablet (25 mg total) by mouth daily., Disp: 30 tablet, Rfl: 6   dapagliflozin propanediol (FARXIGA) 10 MG TABS tablet, Take 1 tablet (10 mg total) by mouth daily before  breakfast., Disp: 30 tablet, Rfl: 11   glucose blood (ONETOUCH VERIO) test strip, 1 each by Other route 2 (two) times daily. And lancets 2/day, Disp: 100 each, Rfl: 12   insulin glargine (LANTUS SOLOSTAR) 100 UNIT/ML Solostar Pen, Inject 15 Units into the skin daily., Disp: 15 mL, Rfl: 1   Lancets (ONETOUCH DELICA PLUS LANCET33G) MISC, 1 each by Other route as directed., Disp: 1 each, Rfl: 1   metFORMIN (GLUCOPHAGE-XR) 500 MG 24 hr tablet, Take 2 tablets (1,000 mg total) by mouth daily with breakfast., Disp: 180 tablet, Rfl: 3   Multiple Vitamins-Minerals (HAIR/SKIN/NAILS) TABS, Take 1 tablet by mouth daily with breakfast., Disp: , Rfl:    rivaroxaban (XARELTO) 10 MG TABS tablet, Take 1 tablet (10 mg total) by mouth daily., Disp: 90 tablet, Rfl: 2  Observations/Objective: Patient is well-developed, well-nourished in no acute distress.  Resting comfortably at home.  Head is normocephalic, atraumatic.  No labored breathing.  Speech is clear and coherent with logical content.  Patient is alert and oriented at baseline.    Assessment and Plan: 1. Upper respiratory tract infection, unspecified type  2. Abdominal pain, unspecified abdominal location  Patient with URI symptoms.  No fever.  GI symptoms that she had yesterday have resolved today.  She is primarily concerned about pain she has in the abdomen when she is coughing, but she denies abdominal tenderness to palpation.    I've recommend watchful waiting.  She states she feels better than yesterday.  It seems like her symptoms might be from muscle strain from coughing spells, but she is strongly advised to be seen in person of the abdominal pains worsen in the next 24 hours.  Follow Up Instructions: I discussed the assessment and treatment plan with the patient. The patient was provided an opportunity to ask questions and all were answered. The patient agreed with the plan and demonstrated an understanding of the instructions.  A copy of  instructions were sent to the patient via MyChart unless otherwise noted below.     The patient was advised to call back or seek an in-person evaluation if the symptoms worsen or if the condition fails to improve as anticipated.  Time:  I spent 11 minutes with the patient via telehealth technology discussing the above problems/concerns.    Whitney Horseman, PA-C

## 2022-12-11 ENCOUNTER — Other Ambulatory Visit: Payer: Self-pay

## 2022-12-11 ENCOUNTER — Observation Stay (HOSPITAL_BASED_OUTPATIENT_CLINIC_OR_DEPARTMENT_OTHER)
Admission: EM | Admit: 2022-12-11 | Discharge: 2022-12-14 | Disposition: A | Discharge status: Home / Self Care | Payer: PRIVATE HEALTH INSURANCE

## 2022-12-11 DIAGNOSIS — N179 Acute kidney failure, unspecified: Secondary | ICD-10-CM | POA: Insufficient documentation

## 2022-12-11 DIAGNOSIS — Z86718 Personal history of other venous thrombosis and embolism: Secondary | ICD-10-CM | POA: Diagnosis not present

## 2022-12-11 DIAGNOSIS — Z1152 Encounter for screening for COVID-19: Secondary | ICD-10-CM | POA: Insufficient documentation

## 2022-12-11 DIAGNOSIS — Z6841 Body Mass Index (BMI) 40.0 and over, adult: Secondary | ICD-10-CM | POA: Diagnosis not present

## 2022-12-11 DIAGNOSIS — Z87891 Personal history of nicotine dependence: Secondary | ICD-10-CM | POA: Insufficient documentation

## 2022-12-11 DIAGNOSIS — Z86711 Personal history of pulmonary embolism: Secondary | ICD-10-CM | POA: Diagnosis not present

## 2022-12-11 DIAGNOSIS — I10 Essential (primary) hypertension: Secondary | ICD-10-CM

## 2022-12-11 DIAGNOSIS — R7989 Other specified abnormal findings of blood chemistry: Secondary | ICD-10-CM | POA: Diagnosis not present

## 2022-12-11 DIAGNOSIS — K529 Noninfective gastroenteritis and colitis, unspecified: Secondary | ICD-10-CM | POA: Insufficient documentation

## 2022-12-11 DIAGNOSIS — Z7984 Long term (current) use of oral hypoglycemic drugs: Secondary | ICD-10-CM | POA: Insufficient documentation

## 2022-12-11 DIAGNOSIS — E876 Hypokalemia: Principal | ICD-10-CM

## 2022-12-11 DIAGNOSIS — Z79899 Other long term (current) drug therapy: Secondary | ICD-10-CM | POA: Insufficient documentation

## 2022-12-11 DIAGNOSIS — Z23 Encounter for immunization: Secondary | ICD-10-CM | POA: Insufficient documentation

## 2022-12-11 DIAGNOSIS — Z794 Long term (current) use of insulin: Secondary | ICD-10-CM | POA: Diagnosis not present

## 2022-12-11 DIAGNOSIS — Z7901 Long term (current) use of anticoagulants: Secondary | ICD-10-CM

## 2022-12-11 DIAGNOSIS — E1165 Type 2 diabetes mellitus with hyperglycemia: Secondary | ICD-10-CM | POA: Diagnosis not present

## 2022-12-11 DIAGNOSIS — R112 Nausea with vomiting, unspecified: Secondary | ICD-10-CM | POA: Diagnosis present

## 2022-12-11 DIAGNOSIS — Z91148 Patient's other noncompliance with medication regimen for other reason: Secondary | ICD-10-CM

## 2022-12-11 DIAGNOSIS — I161 Hypertensive emergency: Secondary | ICD-10-CM | POA: Diagnosis present

## 2022-12-11 LAB — CBC WITH DIFFERENTIAL/PLATELET
Abs Immature Granulocytes: 0.06 10*3/uL (ref 0.00–0.07)
Basophils Absolute: 0 10*3/uL (ref 0.0–0.1)
Basophils Relative: 0 %
Eosinophils Absolute: 0 10*3/uL (ref 0.0–0.5)
Eosinophils Relative: 0 %
HCT: 40 % (ref 36.0–46.0)
Hemoglobin: 13.7 g/dL (ref 12.0–15.0)
Immature Granulocytes: 1 %
Lymphocytes Relative: 7 %
Lymphs Abs: 0.7 10*3/uL (ref 0.7–4.0)
MCH: 26 pg (ref 26.0–34.0)
MCHC: 34.3 g/dL (ref 30.0–36.0)
MCV: 76 fL — ABNORMAL LOW (ref 80.0–100.0)
Monocytes Absolute: 0.7 10*3/uL (ref 0.1–1.0)
Monocytes Relative: 7 %
Neutro Abs: 8.7 10*3/uL — ABNORMAL HIGH (ref 1.7–7.7)
Neutrophils Relative %: 85 %
Platelets: 283 10*3/uL (ref 150–400)
RBC: 5.26 MIL/uL — ABNORMAL HIGH (ref 3.87–5.11)
RDW: 13.7 % (ref 11.5–15.5)
WBC: 10.3 10*3/uL (ref 4.0–10.5)
nRBC: 0 % (ref 0.0–0.2)

## 2022-12-11 LAB — RESP PANEL BY RT-PCR (RSV, FLU A&B, COVID)  RVPGX2
Influenza A by PCR: NEGATIVE
Influenza B by PCR: NEGATIVE
Resp Syncytial Virus by PCR: NEGATIVE
SARS Coronavirus 2 by RT PCR: NEGATIVE

## 2022-12-11 MED ORDER — SODIUM CHLORIDE 0.9 % IV BOLUS
1000.0000 mL | Freq: Once | INTRAVENOUS | Status: AC
  Administered 2022-12-11: 1000 mL via INTRAVENOUS

## 2022-12-11 MED ORDER — KETOROLAC TROMETHAMINE 30 MG/ML IJ SOLN
15.0000 mg | Freq: Once | INTRAMUSCULAR | Status: AC
  Administered 2022-12-11: 15 mg via INTRAVENOUS
  Filled 2022-12-11: qty 1

## 2022-12-11 MED ORDER — ONDANSETRON HCL 4 MG/2ML IJ SOLN
4.0000 mg | Freq: Once | INTRAMUSCULAR | Status: AC
  Administered 2022-12-11: 4 mg via INTRAVENOUS
  Filled 2022-12-11: qty 2

## 2022-12-11 NOTE — ED Provider Notes (Signed)
[place] EMERGENCY DEPARTMENT AT [facility] Provider Note   CSN: [id number] Arrival date & time: 12/11/22  1936     History {Add pertinent medical, surgical, social history, OB history to HPI:1} Chief Complaint  Patient presents with   Vomiting    Whitney [name] is a 37 y.o. female.  Patient with h/o HTN, PE, IDDM, OSA presents with symptoms that started as nausea with vomiting yesterday early morning. Since, she has onset generalized body aches, diarrhea, chills and fever. No cough, congestion or sore throat. No sick contacts.   The history is provided by the patient. No language interpreter was used.       Home Medications Prior to Admission medications   Medication Sig Start Date End Date Taking? Authorizing Provider  acetaminophen (TYLENOL CHILDRENS) 160 MG/5ML suspension Take 31.3 mLs (1,000 mg total) by mouth every 8 (eight) hours as needed. 11/04/20   Raspet, Noberto Retort, PA-C  amLODipine-olmesartan (AZOR) 10-40 MG tablet Take 1 tablet by mouth daily. 05/04/22   Elson Areas, PA-C  chlorthalidone (HYGROTON) 25 MG tablet Take 1 tablet (25 mg total) by mouth daily. 05/04/22   Elson Areas, PA-C  dapagliflozin propanediol (FARXIGA) 10 MG TABS tablet Take 1 tablet (10 mg total) by mouth daily before breakfast. 05/04/22   Elson Areas, PA-C  glucose blood (ONETOUCH VERIO) test strip 1 each by Other route 2 (two) times daily. And lancets 2/day 05/04/22   Elson Areas, PA-C  insulin glargine (LANTUS SOLOSTAR) 100 UNIT/ML Solostar Pen Inject 15 Units into the skin daily. 05/04/22   Elson Areas, PA-C  Lancets [facility] DELICA PLUS [place]) MISC 1 each by Other route as directed. 05/04/22   Elson Areas, PA-C  metFORMIN (GLUCOPHAGE-XR) 500 MG 24 hr tablet Take 2 tablets (1,000 mg total) by mouth daily with breakfast. 05/04/22   Elson Areas, PA-C  Multiple Vitamins-Minerals (HAIR/SKIN/NAILS) TABS Take 1 tablet by mouth daily with breakfast.    [provider]  rivaroxaban (XARELTO) 10 MG TABS tablet Take 1 tablet (10 mg total) by mouth daily. 05/04/22   Elson Areas, PA-C      Allergies    Patient has no known allergies.    Review of Systems   Review of Systems  Physical Exam Updated Vital Signs BP (!) 149/99 (BP Location: Left Arm)   Pulse 98   Temp 99.4 F (37.4 C) (Oral)   Resp (!) 22   Ht 5\' 11"  (1.803 m)   Wt (!) 155.6 kg   SpO2 98%   BMI 47.84 kg/m  Physical Exam Vitals and nursing note reviewed.  Constitutional:      Appearance: She is obese.  HENT:     Mouth/Throat:     Mouth: Mucous membranes are moist.  Cardiovascular:     Rate and Rhythm: Regular rhythm. Tachycardia present.     Heart sounds: No murmur heard. Pulmonary:     Effort: Pulmonary effort is normal.     Breath sounds: No wheezing, rhonchi or rales.  Abdominal:     Palpations: Abdomen is soft.     Tenderness: There is no abdominal tenderness.  Musculoskeletal:        General: Normal range of motion.     Cervical back: Normal range of motion and neck supple.  Skin:    General: Skin is warm and dry.  Neurological:     Mental Status: She is oriented to person, place, and time.     ED  Results / Procedures / Treatments   Labs (all labs ordered are listed, but only abnormal results are displayed) Labs Reviewed  RESP PANEL BY RT-PCR (RSV, FLU A&B, COVID)  [id number]  URINALYSIS, ROUTINE W REFLEX MICROSCOPIC  CBC WITH DIFFERENTIAL/PLATELET  COMPREHENSIVE METABOLIC PANEL  LIPASE, BLOOD  HCG, SERUM, QUALITATIVE    EKG None  Radiology No results found.  Procedures Procedures  {Document cardiac monitor, telemetry assessment procedure when appropriate:1}  Medications Ordered in ED Medications  sodium chloride 0.9 % bolus 1,000 mL (has no administration in time range)  ketorolac (TORADOL) 30 MG/ML injection 15 mg (has no administration in time range)  ondansetron (ZOFRAN) injection 4 mg (has no administration in time range)     ED Course/ Medical Decision Making/ A&P   {   Click here for ABCD2, HEART and other calculatorsREFRESH Note before signing :1}                              Medical Decision Making This patient presents to the ED for concern of N, V, fever, this involves an extensive number of treatment options, and is a complaint that carries with it a high risk of complications and morbidity.  The differential diagnosis includes gastroenteritis, COVID, DKA, food poisoning   Co morbidities that complicate the patient evaluation  HTN, IDDM, PE   Additional history obtained:   External records from outside source obtained and reviewed including previous ED records, specifically, 04/2022 for uncontrolled HTN and medication noncompliance   Lab Tests:  I Ordered, and personally interpreted labs.  The pertinent results include:  ***    Imaging Studies ordered:  I ordered imaging studies including ***  I independently visualized and interpreted imaging which showed *** I agree with the radiologist interpretation   Cardiac Monitoring: / EKG:  The patient was maintained on a cardiac monitor.  I personally viewed and interpreted the cardiac monitored which showed an underlying rhythm of: ***   Consultations Obtained:  I requested consultation with the ***,  and discussed lab and imaging findings as well as pertinent plan - they recommend: ***   Problem List / ED Course / Critical interventions / Medication management  Patient with complicated medical history including noncompliance with medications.   I ordered medication including ***  for ***  Reevaluation of the patient after these medicines showed that the patient {resolved/improved/worsened:23923::"improved"} I have reviewed the patients home medicines and have made adjustments as needed   Social Determinants of Health:  ***   Test / Admission - Considered:  ***    Amount and/or Complexity of Data Reviewed Labs:  ordered.  Risk Prescription drug management.   ***  {Document critical care time when appropriate:1} {Document review of labs and clinical decision tools ie heart score, Chads2Vasc2 etc:1}  {Document your independent review of radiology images, and any outside records:1} {Document your discussion with family members, caretakers, and with consultants:1} {Document social determinants of health affecting pt's care:1} {Document your decision making why or why not admission, treatments were needed:1} Final Clinical Impression(s) / ED Diagnoses Final diagnoses:  None    Rx / DC Orders ED Discharge Orders     None

## 2022-12-11 NOTE — ED Triage Notes (Signed)
States woke up this morning with N/V. Endorses body aches, chills, and fever. Deni

## 2022-12-11 NOTE — ED Notes (Signed)
Patient placed on 2LNC due to desaturations tp 80% while sleeping. SpO2 96% following being placed on oxygen.

## 2022-12-11 NOTE — ED Triage Notes (Signed)
States woke up this morning with N/V. Endorses body aches, chills, and fever. Denies taking any OTC.

## 2022-12-12 ENCOUNTER — Emergency Department (HOSPITAL_BASED_OUTPATIENT_CLINIC_OR_DEPARTMENT_OTHER): Payer: PRIVATE HEALTH INSURANCE

## 2022-12-12 ENCOUNTER — Encounter (HOSPITAL_COMMUNITY): Payer: Self-pay | Admitting: Internal Medicine

## 2022-12-12 DIAGNOSIS — R7989 Other specified abnormal findings of blood chemistry: Secondary | ICD-10-CM | POA: Diagnosis present

## 2022-12-12 DIAGNOSIS — R112 Nausea with vomiting, unspecified: Secondary | ICD-10-CM | POA: Diagnosis not present

## 2022-12-12 DIAGNOSIS — E1165 Type 2 diabetes mellitus with hyperglycemia: Secondary | ICD-10-CM | POA: Diagnosis not present

## 2022-12-12 DIAGNOSIS — Z86718 Personal history of other venous thrombosis and embolism: Secondary | ICD-10-CM

## 2022-12-12 DIAGNOSIS — I161 Hypertensive emergency: Secondary | ICD-10-CM | POA: Diagnosis present

## 2022-12-12 DIAGNOSIS — R197 Diarrhea, unspecified: Secondary | ICD-10-CM

## 2022-12-12 DIAGNOSIS — Z794 Long term (current) use of insulin: Secondary | ICD-10-CM

## 2022-12-12 DIAGNOSIS — Z86711 Personal history of pulmonary embolism: Secondary | ICD-10-CM | POA: Diagnosis present

## 2022-12-12 DIAGNOSIS — Z7901 Long term (current) use of anticoagulants: Secondary | ICD-10-CM

## 2022-12-12 LAB — BASIC METABOLIC PANEL
Anion gap: 7 (ref 5–15)
BUN: 13 mg/dL (ref 6–20)
CO2: 26 mmol/L (ref 22–32)
Calcium: 8.1 mg/dL — ABNORMAL LOW (ref 8.9–10.3)
Chloride: 102 mmol/L (ref 98–111)
Creatinine, Ser: 1.04 mg/dL — ABNORMAL HIGH (ref 0.44–1.00)
GFR, Estimated: >60 mL/min (ref >60)
Glucose, Bld: 224 mg/dL — ABNORMAL HIGH (ref 70–99)
Potassium: 3.5 mmol/L (ref 3.5–5.1)
Sodium: 135 mmol/L (ref 135–145)

## 2022-12-12 LAB — COMPREHENSIVE METABOLIC PANEL
ALT: 13 U/L (ref 0–44)
AST: 11 U/L — ABNORMAL LOW (ref 15–41)
Albumin: 3.8 g/dL (ref 3.5–5.0)
Alkaline Phosphatase: 55 U/L (ref 38–126)
Anion gap: 13 (ref 5–15)
BUN: 13 mg/dL (ref 6–20)
CO2: 23 mmol/L (ref 22–32)
Calcium: 9.4 mg/dL (ref 8.9–10.3)
Chloride: 98 mmol/L (ref 98–111)
Creatinine, Ser: 1.12 mg/dL — ABNORMAL HIGH (ref 0.44–1.00)
GFR, Estimated: >60 mL/min (ref >60)
Glucose, Bld: 203 mg/dL — ABNORMAL HIGH (ref 70–99)
Potassium: 2.7 mmol/L — CL (ref 3.5–5.1)
Sodium: 134 mmol/L — ABNORMAL LOW (ref 135–145)
Total Bilirubin: 0.9 mg/dL (ref <1.2)
Total Protein: 7.3 g/dL (ref 6.5–8.1)

## 2022-12-12 LAB — MAGNESIUM
Magnesium: 1.4 mg/dL — ABNORMAL LOW (ref 1.7–2.4)
Magnesium: 2.6 mg/dL — ABNORMAL HIGH (ref 1.7–2.4)

## 2022-12-12 LAB — URINALYSIS, ROUTINE W REFLEX MICROSCOPIC
Bilirubin Urine: NEGATIVE
Glucose, UA: NEGATIVE mg/dL
Hgb urine dipstick: NEGATIVE
Leukocytes,Ua: NEGATIVE
Nitrite: NEGATIVE
Protein, ur: 30 mg/dL — AB
Specific Gravity, Urine: 1.011 (ref 1.005–1.030)
pH: 6 (ref 5.0–8.0)

## 2022-12-12 LAB — GLUCOSE, CAPILLARY
Glucose-Capillary: 190 mg/dL — ABNORMAL HIGH (ref 70–99)
Glucose-Capillary: 142 mg/dL — ABNORMAL HIGH (ref 70–99)
Glucose-Capillary: 132 mg/dL — ABNORMAL HIGH (ref 70–99)

## 2022-12-12 LAB — TROPONIN I (HIGH SENSITIVITY): Troponin I (High Sensitivity): 28 ng/L — ABNORMAL HIGH (ref <18)

## 2022-12-12 LAB — LIPASE, BLOOD: Lipase: 26 U/L (ref 11–51)

## 2022-12-12 LAB — HIV ANTIBODY (ROUTINE TESTING W REFLEX): HIV Screen 4th Generation wRfx: NONREACTIVE

## 2022-12-12 LAB — HCG, SERUM, QUALITATIVE: Preg, Serum: NEGATIVE

## 2022-12-12 LAB — HEMOGLOBIN A1C
Hgb A1c MFr Bld: 8.5 % — ABNORMAL HIGH (ref 4.8–5.6)
Mean Plasma Glucose: 197.25 mg/dL

## 2022-12-12 MED ORDER — DIPHENHYDRAMINE HCL 50 MG/ML IJ SOLN
12.5000 mg | Freq: Once | INTRAMUSCULAR | Status: AC
  Administered 2022-12-12: 12.5 mg via INTRAVENOUS
  Filled 2022-12-12: qty 1

## 2022-12-12 MED ORDER — ALBUTEROL SULFATE (2.5 MG/3ML) 0.083% IN NEBU: 2.5000 mg | INHALATION_SOLUTION | Freq: Four times a day (QID) | RESPIRATORY_TRACT | Status: DC | PRN

## 2022-12-12 MED ORDER — HYDRALAZINE HCL 25 MG PO TABS
25.0000 mg | ORAL_TABLET | Freq: Three times a day (TID) | ORAL | Status: DC
  Administered 2022-12-12 (×2): 25 mg via ORAL
  Filled 2022-12-12 (×2): qty 1

## 2022-12-12 MED ORDER — CHLORTHALIDONE 25 MG PO TABS: 25.0000 mg | ORAL_TABLET | Freq: Once | ORAL | Status: DC

## 2022-12-12 MED ORDER — ACETAMINOPHEN 325 MG PO TABS
650.0000 mg | ORAL_TABLET | Freq: Four times a day (QID) | ORAL | Status: DC | PRN
  Administered 2022-12-12: 650 mg via ORAL
  Filled 2022-12-12: qty 2

## 2022-12-12 MED ORDER — ONDANSETRON HCL 4 MG/2ML IJ SOLN: 4.0000 mg | Freq: Four times a day (QID) | INTRAMUSCULAR | Status: DC | PRN

## 2022-12-12 MED ORDER — IRBESARTAN 300 MG PO TABS
300.0000 mg | ORAL_TABLET | Freq: Every day | ORAL | Status: DC
  Administered 2022-12-12: 300 mg via ORAL
  Filled 2022-12-12: qty 2

## 2022-12-12 MED ORDER — METFORMIN HCL ER 500 MG PO TB24
1000.0000 mg | ORAL_TABLET | Freq: Every day | ORAL | Status: DC
  Administered 2022-12-12: 1000 mg via ORAL
  Filled 2022-12-12: qty 2

## 2022-12-12 MED ORDER — ONDANSETRON HCL 4 MG PO TABS: 4.0000 mg | ORAL_TABLET | Freq: Four times a day (QID) | ORAL | Status: DC | PRN

## 2022-12-12 MED ORDER — CHLORTHALIDONE 25 MG PO TABS: 25.0000 mg | ORAL_TABLET | Freq: Every day | ORAL | Status: DC

## 2022-12-12 MED ORDER — MAGNESIUM SULFATE 2 GM/50ML IV SOLN
2.0000 g | Freq: Once | INTRAVENOUS | Status: AC
  Administered 2022-12-12: 2 g via INTRAVENOUS
  Filled 2022-12-12: qty 50

## 2022-12-12 MED ORDER — HYDRALAZINE HCL 20 MG/ML IJ SOLN
10.0000 mg | INTRAMUSCULAR | Status: DC | PRN
  Filled 2022-12-12: qty 1

## 2022-12-12 MED ORDER — AMLODIPINE BESYLATE 5 MG PO TABS
10.0000 mg | ORAL_TABLET | Freq: Once | ORAL | Status: AC
  Administered 2022-12-12: 10 mg via ORAL
  Filled 2022-12-12: qty 2

## 2022-12-12 MED ORDER — AMLODIPINE BESYLATE 10 MG PO TABS
10.0000 mg | ORAL_TABLET | Freq: Every day | ORAL | Status: DC
  Administered 2022-12-13 – 2022-12-14 (×2): 10 mg via ORAL
  Filled 2022-12-12 (×2): qty 1

## 2022-12-12 MED ORDER — HYDRALAZINE HCL 20 MG/ML IJ SOLN
10.0000 mg | Freq: Once | INTRAMUSCULAR | Status: AC
  Administered 2022-12-12: 10 mg via INTRAVENOUS
  Filled 2022-12-12: qty 1

## 2022-12-12 MED ORDER — POTASSIUM CHLORIDE 10 MEQ/100ML IV SOLN
10.0000 meq | Freq: Once | INTRAVENOUS | Status: AC
  Administered 2022-12-12: 10 meq via INTRAVENOUS
  Filled 2022-12-12: qty 100

## 2022-12-12 MED ORDER — SODIUM CHLORIDE 0.9% FLUSH
3.0000 mL | Freq: Two times a day (BID) | INTRAVENOUS | Status: DC
  Administered 2022-12-12 – 2022-12-14 (×4): 3 mL via INTRAVENOUS

## 2022-12-12 MED ORDER — METOCLOPRAMIDE HCL 5 MG/ML IJ SOLN
10.0000 mg | Freq: Once | INTRAMUSCULAR | Status: AC
  Administered 2022-12-12: 10 mg via INTRAVENOUS
  Filled 2022-12-12: qty 2

## 2022-12-12 MED ORDER — INFLUENZA VIRUS VACC SPLIT PF (FLUZONE) 0.5 ML IM SUSY
0.5000 mL | PREFILLED_SYRINGE | INTRAMUSCULAR | Status: AC
  Administered 2022-12-14: 0.5 mL via INTRAMUSCULAR
  Filled 2022-12-12: qty 0.5

## 2022-12-12 MED ORDER — ACETAMINOPHEN 500 MG PO TABS
1000.0000 mg | ORAL_TABLET | Freq: Once | ORAL | Status: AC
  Administered 2022-12-12: 1000 mg via ORAL
  Filled 2022-12-12: qty 2

## 2022-12-12 MED ORDER — LABETALOL HCL 5 MG/ML IV SOLN
20.0000 mg | INTRAVENOUS | Status: DC | PRN
  Administered 2022-12-12: 20 mg via INTRAVENOUS
  Filled 2022-12-12 (×2): qty 4

## 2022-12-12 MED ORDER — MAGNESIUM OXIDE -MG SUPPLEMENT 400 (240 MG) MG PO TABS
400.0000 mg | ORAL_TABLET | Freq: Once | ORAL | Status: AC
Start: 2022-12-12 — End: 2022-12-12
  Administered 2022-12-12: 400 mg via ORAL
  Filled 2022-12-12: qty 1

## 2022-12-12 MED ORDER — IRBESARTAN 300 MG PO TABS: 300.0000 mg | ORAL_TABLET | Freq: Every day | ORAL | Status: DC

## 2022-12-12 MED ORDER — METFORMIN HCL ER 500 MG PO TB24: 1000.0000 mg | ORAL_TABLET | Freq: Every day | ORAL | Status: DC

## 2022-12-12 MED ORDER — ACETAMINOPHEN 650 MG RE SUPP: 650.0000 mg | Freq: Four times a day (QID) | RECTAL | Status: DC | PRN

## 2022-12-12 MED ORDER — AMLODIPINE-OLMESARTAN 10-40 MG PO TABS: 1.0000 | ORAL_TABLET | Freq: Every day | ORAL | Status: DC

## 2022-12-12 MED ORDER — INSULIN ASPART 100 UNIT/ML IJ SOLN
0.0000 [IU] | Freq: Three times a day (TID) | INTRAMUSCULAR | Status: DC
  Administered 2022-12-12 – 2022-12-13 (×2): 2 [IU] via SUBCUTANEOUS
  Administered 2022-12-13: 3 [IU] via SUBCUTANEOUS
  Administered 2022-12-13: 2 [IU] via SUBCUTANEOUS
  Administered 2022-12-14: 3 [IU] via SUBCUTANEOUS

## 2022-12-12 MED ORDER — POTASSIUM CHLORIDE CRYS ER 20 MEQ PO TBCR
20.0000 meq | EXTENDED_RELEASE_TABLET | Freq: Once | ORAL | Status: AC
  Administered 2022-12-12: 20 meq via ORAL
  Filled 2022-12-12: qty 1

## 2022-12-12 MED ORDER — PNEUMOCOCCAL 20-VAL CONJ VACC 0.5 ML IM SUSY
0.5000 mL | PREFILLED_SYRINGE | INTRAMUSCULAR | Status: AC
  Administered 2022-12-14: 0.5 mL via INTRAMUSCULAR
  Filled 2022-12-12: qty 0.5

## 2022-12-12 MED ORDER — INSULIN GLARGINE-YFGN 100 UNIT/ML ~~LOC~~ SOLN
15.0000 [IU] | Freq: Every day | SUBCUTANEOUS | Status: DC
  Administered 2022-12-12 – 2022-12-14 (×3): 15 [IU] via SUBCUTANEOUS
  Filled 2022-12-12 (×2): qty 0.15
  Filled 2022-12-12: qty 150

## 2022-12-12 MED ORDER — RIVAROXABAN 10 MG PO TABS
10.0000 mg | ORAL_TABLET | Freq: Every day | ORAL | Status: DC
  Administered 2022-12-12 – 2022-12-14 (×3): 10 mg via ORAL
  Filled 2022-12-12 (×3): qty 1

## 2022-12-12 MED ORDER — IOHEXOL 300 MG/ML  SOLN
100.0000 mL | Freq: Once | INTRAMUSCULAR | Status: AC | PRN
  Administered 2022-12-12: 100 mL via INTRAVENOUS

## 2022-12-12 MED ORDER — DAPAGLIFLOZIN PROPANEDIOL 10 MG PO TABS
10.0000 mg | ORAL_TABLET | Freq: Every day | ORAL | Status: DC
  Administered 2022-12-12 – 2022-12-14 (×3): 10 mg via ORAL
  Filled 2022-12-12 (×3): qty 1

## 2022-12-12 NOTE — H&P (Signed)
History and Physical    Patient: Whitney [name] ZOX:[id number] DOB: 02-Nov-1985 DOA: 12/11/2022 DOS: the patient was seen and examined on 12/12/2022 PCP: Pcp, No  Patient coming from: Transfer from [facility]  Chief Complaint:  Chief Complaint  Patient presents with   Vomiting   HPI: Whitney [name] is a 37 y.o. female with medical history significant of hypertension, diabetes mellitus type 2, OSA, DVT/PE on Xarelto who presents with complaints of nausea, vomiting, and diarrhea.  Symptoms started yesterday morning at around 4 AM.  Patient reported having 2 egg rolls from [employer] for dinner the night prior.  Emesis was initially of what she had eaten for dinner, but thereafter reported vomiting up yellow bile.  Soon thereafter she developed crampy lower abdominal pain and generalized bodyaches.  Reported having subjective fevers as well as diarrhea.  In the emergency department patient was noted to be afebrile with pulse 95-1 13, respirations 10-33, blood pressure elevated up to 215/107, and O2 saturations noted to be temporarily as low as 89% on room air.  Labs from 11/18 significant for WBC 10.3, sodium 134, potassium 2.7, glucose 203, BUN 13, creatinine 1.12,  magnesium 1.4, and troponin 28.  CT scan of the abdomen pelvis noted no acute findings.  Patient had been given 1 L of normal saline IV fluids, potassium chloride 30 mill equivalents, Reglan, Zofran, IV, and ketorolac for pain.  Last episode of vomiting was yesterday evening.   Review of Systems: As mentioned in the history of present illness. All other systems reviewed and are negative. Past Medical History:  Diagnosis Date   Diabetes mellitus without complication (HCC)    type 2   DVT (deep venous thrombosis) (HCC) 03/2018   Gallstones    Headache(784.0)    Hypertension    OSA (obstructive sleep apnea) 05/26/2020   Sleep apnea    does not need cpap   Past Surgical History:  Procedure Laterality Date   CHOLECYSTECTOMY N/A 03/12/2012    Procedure: LAPAROSCOPIC CHOLECYSTECTOMY;  Surgeon: Whitney Rubenstein, Whitney [name];  Location: [facility] OR;  Service: General;  Laterality: N/A;   HYSTEROSCOPY WITH D & C N/A 05/11/2020   Procedure: DILATATION AND CURETTAGE /HYSTEROSCOPY;  Surgeon: Whitney Hart, Whitney [name];  Location: [facility] OR;  Service: Gynecology;  Laterality: N/A;   LAPAROSCOPY N/A 05/11/2020   Procedure: LAPAROSCOPY OPERATIVE; PELVIC WASHINGS;  Surgeon: Whitney Hart, Whitney [name];  Location: [facility] OR;  Service: Gynecology;  Laterality: N/A;  OVARIAN CYSTECTOMY   Social History:  reports that she quit smoking about 11 years ago. Her smoking use included cigarettes. She has never used smokeless tobacco. She reports current alcohol use. She reports that she does not use drugs.  No Known Allergies  Family History  Problem Relation Age of Onset   Healthy Mother    Ulcerative colitis Father    Diabetes Maternal Grandmother    Breast cancer Maternal Grandmother    Hypertension Maternal Grandmother    Syncope episode Maternal Grandfather    Benign prostatic hyperplasia Maternal Grandfather    Brain cancer Paternal Grandmother    Lung cancer Paternal Grandfather    Hypertension Son 8   Anesthesia problems Neg Hx    Hypotension Neg Hx    Malignant hyperthermia Neg Hx    Pseudochol deficiency Neg Hx    Colon cancer Neg Hx    Esophageal cancer Neg Hx    Rectal cancer Neg Hx     Prior to Admission medications   Medication Sig Start Date End Date Taking? Authorizing  Provider  acetaminophen (TYLENOL CHILDRENS) 160 MG/5ML suspension Take 31.3 mLs (1,000 mg total) by mouth every 8 (eight) hours as needed. 11/04/20   Raspet, Noberto Retort, PA-C  amLODipine-olmesartan (AZOR) 10-40 MG tablet Take 1 tablet by mouth daily. 05/04/22   Elson Areas, PA-C  chlorthalidone (HYGROTON) 25 MG tablet Take 1 tablet (25 mg total) by mouth daily. 05/04/22   Elson Areas, PA-C  dapagliflozin propanediol (FARXIGA) 10 MG TABS tablet Take 1 tablet (10 mg total) by mouth daily before  breakfast. 05/04/22   Elson Areas, PA-C  glucose blood (ONETOUCH VERIO) test strip 1 each by Other route 2 (two) times daily. And lancets 2/day 05/04/22   Elson Areas, PA-C  insulin glargine (LANTUS SOLOSTAR) 100 UNIT/ML Solostar Pen Inject 15 Units into the skin daily. 05/04/22   Elson Areas, PA-C  Lancets [facility] DELICA PLUS [place]) MISC 1 each by Other route as directed. 05/04/22   Elson Areas, PA-C  metFORMIN (GLUCOPHAGE-XR) 500 MG 24 hr tablet Take 2 tablets (1,000 mg total) by mouth daily with breakfast. 05/04/22   Elson Areas, PA-C  Multiple Vitamins-Minerals (HAIR/SKIN/NAILS) TABS Take 1 tablet by mouth daily with breakfast.    Provider, Historical, Whitney [name]  rivaroxaban (XARELTO) 10 MG TABS tablet Take 1 tablet (10 mg total) by mouth daily. 05/04/22   Elson Areas, PA-C    Physical Exam: Vitals:   12/12/22 1205 12/12/22 1207 12/12/22 1230 12/12/22 1343  BP: (!) 157/118  (!) 184/116 (!) 184/107  Pulse: (!) 101  94   Resp: 18     Temp:  98.4 F (36.9 C)    TempSrc:  Oral    SpO2: 99%  98%   Weight:      Height:        Constitutional: Middle-age female who appears to be no acute distress Eyes: PERRL, lids and conjunctivae normal ENMT: Mucous membranes are moist.  Fair dentition Neck: normal, supple,   Respiratory: clear to auscultation bilaterally, no wheezing, no crackles. Normal respiratory effort. No accessory muscle use.  Cardiovascular: Regular rate and rhythm, no murmurs / rubs / gallops. No extremity edema. 2+ pedal pulses. No carotid bruits.  Abdomen: no tenderness, no masses palpated  Bowel sounds positive.  Musculoskeletal: no clubbing / cyanosis. No joint deformity upper and lower extremities. Good ROM, no contractures. Normal muscle tone.  Skin: no rashes, lesions, ulcers. No induration Neurologic: CN 2-12 grossly intact.  . Strength 5/5 in all 4.  Psychiatric: Normal judgment and insight. Alert and oriented x 3. Normal mood.   Data  Reviewed:  EKG revealed a sinus rhythm at 95 bpm with first-degree heart block, biatrial enlargement.  Reviewed labs, imaging, and pertinent records as documented.  Assessment and Plan:  Nausea, vomiting, and diarrhea secondary to gastroenteritis Patient presented with complaints of nausea, vomiting, and diarrhea after having eggrolls from [employer] the night prior.  CT scan of the abdomen and pelvis did not note any acute abnormality.  Patient reports symptoms have since resolved.  Symptoms possibly secondary to  gastroenteritis -Admit to a medical telemetry bed -Monitor intake and output -Diet as tolerated -Antiemetics as needed  Hypertensive urgency On admission blood pressures noted to be elevated up to 215/107.  Home blood pressure regimen includes amlodipine-olmesartan and chlorthalidone. -Continue home regimen -Add hydralazine 25 mg p.o. every 8 hours -Hydralazine IV as needed for elevated blood pressures  Elevated troponin Acute.  High-sensitivity troponin was noted to be mildly elevated at 28.  EKG  did not note significant ischemic changes.  Suspect likely secondary to demand. -Continue to monitor  Uncontrolled diabetes mellitus type 2, with long-term use of insulin Glucose on labs this morning elevated up to 224.  Hospital for hemoglobin A1c 9.9 from 03/2019. 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 Inpatient  status is judged to be reasonable and necessary in order to provide the required intensity of service to ensure the patient's safety. The patient's presenting symptoms, physical exam findings, and initial radiographic and laboratory data in the context of their chronic comorbidities is felt to place them at high risk for further clinical deterioration. Furthermore, it is not anticipated that the patient will be medically stable for discharge from the hospital within 2 midnights of admission.   * I certify that at the point of admission it is my clinical judgment that the patient will require inpatient hospital care spanning beyond 2 midnights from the point of admission due to high intensity of service, high risk for further deterioration and high frequency of surveillance required.*  Author: Clydie Braun, Whitney [name] 12/12/2022 2:17 PM  For on call review [email or web address].

## 2022-12-12 NOTE — ED Provider Notes (Signed)
  Physical Exam  BP (!) 207/106   Pulse (!) 105   Temp 99.4 F (37.4 C) (Oral)   Resp 16   Ht 5\' 11"  (1.803 m)   Wt (!) 155.6 kg   SpO2 99%   BMI 47.84 kg/m     Procedures  Procedures  ED Course / MDM   Clinical Course as of 12/12/22 0556  Tue Dec 12, 2022  0128 Potassium(!!): 2.7 [JL]  0128 Glucose(!): 203 [JL]  0205 Magnesium(!): 1.4 [JL]  0428 Troponin I (High Sensitivity)(!): 28 [JL]    Clinical Course User Index [JL] Ernie Avena, MD   Medical Decision Making Amount and/or Complexity of Data Reviewed Labs: ordered. Decision-making details documented in ED Course. Radiology: ordered.  Risk OTC drugs. Prescription drug management. Decision regarding hospitalization.   37 year old female with past medical history significant for PE on anticoagulation, hypertension, OSA presenting with nausea, vomiting, generalized bodyaches, diarrhea, fever, chills.  The patient denies chest pain, denies neurologic deficit.  She presents with uncontrolled hypertension.  Laboratory evaluation was performed which revealed hypokalemia to 2.7, replenished orally and IV, hypomagnesemia to 1.4, replenished orally and IV, urinalysis with ketonuria, negative for UTI, COVID and influenza PCR testing negative, CBC without a leukocytosis or anemia, hCG negative, lipase normal.  A cardiac troponin was elevated to 28.  The patient denies any chest pain at this time.  The patient's blood pressures were notably hypertensive, 200/110.  With the elevated troponin, concern for hypertensive emergency.  The patient was administered IV hydralazine and subsequently IV labetalol x 2 with some improvement in her blood pressures to 175/112.  Given her nausea, vomiting, on exam she had abdominal discomfort, CT abdomen pelvis was obtained and results indicated: IMPRESSION:  No acute findings in the abdomen or pelvis.    Hepatic steatosis.   Hospitalist medicine was consulted for admission in the setting of  hypertensive emergency.  Patient was updated regarding the plan of care and was in agreement.  She continued to deny any chest pain or shortness of breath, low concern for PE at this time, low concern for ACS.  Admitted in stable condition, Dr. Margo Aye accepting.     Ernie Avena, MD 12/12/22 [phone]

## 2022-12-12 NOTE — ED Notes (Signed)
Pt to CT at this time.

## 2022-12-12 NOTE — Progress Notes (Signed)
   12/12/22 1500  Spiritual Encounters  Type of Visit Initial  Care provided to: Patient  Conversation partners present during encounter Nurse  Referral source Patient request  Reason for visit Advance directives  OnCall Visit No   Chaplain responded to request for AD. There was no family present at bedside. Chaplain assisted patient with AD education. Patient will page chaplain when she is ready. No follow-up needed at this time.

## 2022-12-12 NOTE — ED Notes (Signed)
Attempted to call report to 5w. Nurse to call back when available.

## 2022-12-12 NOTE — Progress Notes (Signed)
   12/12/22 1555  TOC Brief Assessment  Insurance and Status Reviewed [facility])  Patient has primary care physician No (PCP to be requested)  Home environment has been reviewed From home  Prior level of function: Independent  Prior/Current Home Services No current home services  Social Determinants of Health Reivew SDOH reviewed no interventions necessary  Readmission risk has been reviewed Yes (13%)  Transition of care needs transition of care needs identified, TOC will continue to follow (Possible home O2 needed  Will follow)   Following for possible home O2 needs

## 2022-12-12 NOTE — ED Notes (Signed)
Thomas with cl called for transport

## 2022-12-13 ENCOUNTER — Encounter (HOSPITAL_COMMUNITY): Payer: Self-pay | Admitting: Internal Medicine

## 2022-12-13 DIAGNOSIS — R112 Nausea with vomiting, unspecified: Secondary | ICD-10-CM | POA: Diagnosis not present

## 2022-12-13 DIAGNOSIS — R197 Diarrhea, unspecified: Secondary | ICD-10-CM | POA: Diagnosis not present

## 2022-12-13 LAB — BASIC METABOLIC PANEL
Anion gap: 12 (ref 5–15)
BUN: 10 mg/dL (ref 6–20)
CO2: 22 mmol/L (ref 22–32)
Calcium: 8.3 mg/dL — ABNORMAL LOW (ref 8.9–10.3)
Chloride: 105 mmol/L (ref 98–111)
Creatinine, Ser: 1.24 mg/dL — ABNORMAL HIGH (ref 0.44–1.00)
GFR, Estimated: 57 mL/min — ABNORMAL LOW (ref >60)
Glucose, Bld: 150 mg/dL — ABNORMAL HIGH (ref 70–99)
Potassium: 3.1 mmol/L — ABNORMAL LOW (ref 3.5–5.1)
Sodium: 139 mmol/L (ref 135–145)

## 2022-12-13 LAB — CBC
HCT: 38.8 % (ref 36.0–46.0)
Hemoglobin: 12.7 g/dL (ref 12.0–15.0)
MCH: 25.8 pg — ABNORMAL LOW (ref 26.0–34.0)
MCHC: 32.7 g/dL (ref 30.0–36.0)
MCV: 78.7 fL — ABNORMAL LOW (ref 80.0–100.0)
Platelets: 229 10*3/uL (ref 150–400)
RBC: 4.93 MIL/uL (ref 3.87–5.11)
RDW: 14.3 % (ref 11.5–15.5)
WBC: 8.3 10*3/uL (ref 4.0–10.5)
nRBC: 0 % (ref 0.0–0.2)

## 2022-12-13 LAB — GLUCOSE, CAPILLARY
Glucose-Capillary: 184 mg/dL — ABNORMAL HIGH (ref 70–99)
Glucose-Capillary: 136 mg/dL — ABNORMAL HIGH (ref 70–99)
Glucose-Capillary: 137 mg/dL — ABNORMAL HIGH (ref 70–99)
Glucose-Capillary: 113 mg/dL — ABNORMAL HIGH (ref 70–99)

## 2022-12-13 LAB — MAGNESIUM: Magnesium: 2 mg/dL (ref 1.7–2.4)

## 2022-12-13 MED ORDER — CARVEDILOL 3.125 MG PO TABS
3.1250 mg | ORAL_TABLET | Freq: Two times a day (BID) | ORAL | Status: DC
  Administered 2022-12-13 (×2): 3.125 mg via ORAL
  Filled 2022-12-13 (×2): qty 1

## 2022-12-13 MED ORDER — HYDRALAZINE HCL 50 MG PO TABS
50.0000 mg | ORAL_TABLET | Freq: Three times a day (TID) | ORAL | Status: DC
  Administered 2022-12-13: 50 mg via ORAL
  Filled 2022-12-13: qty 1

## 2022-12-13 MED ORDER — LACTATED RINGERS IV SOLN: INTRAVENOUS | Status: AC

## 2022-12-13 MED ORDER — BUTALBITAL-APAP-CAFFEINE 50-325-40 MG PO TABS
1.0000 | ORAL_TABLET | Freq: Four times a day (QID) | ORAL | Status: DC | PRN
  Administered 2022-12-13: 1 via ORAL
  Filled 2022-12-13: qty 1

## 2022-12-13 MED ORDER — POTASSIUM CHLORIDE CRYS ER 20 MEQ PO TBCR
40.0000 meq | EXTENDED_RELEASE_TABLET | Freq: Once | ORAL | Status: AC
  Administered 2022-12-13: 40 meq via ORAL
  Filled 2022-12-13: qty 2

## 2022-12-13 MED ORDER — HYDRALAZINE HCL 20 MG/ML IJ SOLN: 10.0000 mg | Freq: Four times a day (QID) | INTRAMUSCULAR | Status: DC | PRN

## 2022-12-13 MED ORDER — POTASSIUM CHLORIDE CRYS ER 20 MEQ PO TBCR
20.0000 meq | EXTENDED_RELEASE_TABLET | Freq: Once | ORAL | Status: AC
  Administered 2022-12-13: 20 meq via ORAL
  Filled 2022-12-13: qty 1

## 2022-12-13 MED ORDER — ISOSORBIDE MONONITRATE ER 30 MG PO TB24
30.0000 mg | ORAL_TABLET | Freq: Every day | ORAL | Status: DC
  Administered 2022-12-13 – 2022-12-14 (×2): 30 mg via ORAL
  Filled 2022-12-13 (×2): qty 1

## 2022-12-13 MED ORDER — HYDRALAZINE HCL 50 MG PO TABS
100.0000 mg | ORAL_TABLET | Freq: Three times a day (TID) | ORAL | Status: DC
  Administered 2022-12-13 – 2022-12-14 (×3): 100 mg via ORAL
  Filled 2022-12-13 (×3): qty 2

## 2022-12-13 NOTE — Progress Notes (Signed)
PROGRESS NOTE                                                                                                                                                                                                             Patient Demographics:    Whitney [name], is a 37 y.o. female, DOB - [birth year], UJW:[id number]  Outpatient Primary MD for the patient is Pcp, No    LOS - 1  Admit date - 12/11/2022    Chief Complaint  Patient presents with   Vomiting       Brief Narrative (HPI from H&P)   37 y.o. female with medical history significant of hypertension, diabetes mellitus type 2, OSA, DVT/PE on Xarelto who presents with complaints of nausea, vomiting, and diarrhea.  Symptoms started yesterday morning at around 4 AM.  Patient reported having 2 egg rolls from [employer] for dinner the night prior.  Emesis was initially of what she had eaten for dinner, but thereafter reported vomiting up yellow bile.  Soon thereafter she developed crampy lower abdominal pain and generalized bodyaches.  Reported having subjective fevers as well as diarrhea.  In the ER she was diagnosed with gastroenteritis, hypertensive crisis and admitted to the hospital.   Subjective:    Whitney [name] today has, No headache, No chest pain, No abdominal pain - No Nausea, No new weakness tingling or numbness, no SOB   Assessment  & Plan :   Gastroenteritis.  Likely due to food poisoning at a restaurant, overall symptoms improved, no nausea, no further diarrhea, abdominal exam benign, CT benign.  Continue to monitor with supportive care.   Hypertensive crisis.  Medications adjusted for better control, skipping ARB due to mild AKI.  AKI.  Likely due to dehydration from #1 above.  Hold ARB, gentle hydration and monitor.  Hypokalemia.  Replaced.  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  Morbid obesity.  BMI of 47.  Follow-up with PCP.  DM type II.  On Semglee and  sliding scale monitor and adjust.  Lab Results  Component Value Date   HGBA1C 8.5 (H) 12/12/2022   CBG (last 3)  Recent Labs    12/12/22 1412 12/12/22 1626 12/12/22 2132  GLUCAP 190* 142* 132*         Condition - Fair  Family Communication  :  None  Code Status :  Full  Consults  :  None  PUD Prophylaxis :    Procedures  :     CT - nonacute CT, fatty liver.      Disposition Plan  :    Status is: Inpatient   DVT Prophylaxis  :    rivaroxaban (XARELTO) tablet 10 mg Start: 12/12/22 1700 rivaroxaban (XARELTO) tablet 10 mg     Lab Results  Component Value Date   PLT 229 12/13/2022    Diet :  Diet Order             Diet Carb Modified Fluid consistency: Thin  Diet effective now                    Inpatient Medications  Scheduled Meds:  amLODipine  10 mg Oral Daily   carvedilol  3.125 mg Oral BID WC   dapagliflozin propanediol  10 mg Oral QAC breakfast   hydrALAZINE  100 mg Oral Q8H   influenza vac split trivalent PF  0.5 mL Intramuscular Tomorrow-1000   insulin aspart  0-15 Units Subcutaneous TID WC   insulin glargine-yfgn  15 Units Subcutaneous Daily   isosorbide mononitrate  30 mg Oral Daily   pneumococcal 20-valent conjugate vaccine  0.5 mL Intramuscular Tomorrow-1000   rivaroxaban  10 mg Oral Daily   sodium chloride flush  3 mL Intravenous Q12H   Continuous Infusions:  lactated ringers     PRN Meds:.acetaminophen **OR** acetaminophen, albuterol, hydrALAZINE, ondansetron **OR** ondansetron (ZOFRAN) IV  Antibiotics  :    Anti-infectives (From admission, onward)    None         Objective:   Vitals:   12/12/22 2012 12/13/22 0017 12/13/22 0446 12/13/22 0742  BP: (!) 163/84 (!) 153/101 (!) 154/94 (!) 163/103  Pulse: 100 (!) 101 96 97  Resp:   16   Temp: 99.6 F (37.6 C) 99.1 F (37.3 C) 98.7 F (37.1 C) 98 F (36.7 C)  TempSrc: Oral Oral Oral Oral  SpO2: 94% 95% 94%   Weight:      Height:        Wt Readings from  Last 3 Encounters:  12/11/22 (!) 155.6 kg  11/03/21 (!) 150.6 kg  03/09/21 (!) 152 kg     Intake/Output Summary (Last 24 hours) at 12/13/2022 0747 Last data filed at 12/13/2022 0457 Gross per 24 hour  Intake 240 ml  Output 700 ml  Net -460 ml     Physical Exam  Awake Alert, No new F.N deficits, Normal affect [place].AT,PERRAL Supple Neck, No JVD,   Symmetrical Chest wall movement, Good air movement bilaterally, CTAB RRR,No Gallops,Rubs or new Murmurs,  +ve B.Sounds, Abd Soft, No tenderness,   No Cyanosis, Clubbing or edema        Data Review:    Recent Labs  Lab 12/11/22 2341 12/13/22 0524  WBC 10.3 8.3  HGB 13.7 12.7  HCT 40.0 38.8  PLT 283 229  MCV 76.0* 78.7*  MCH 26.0 25.8*  MCHC 34.3 32.7  RDW 13.7 14.3  LYMPHSABS 0.7  --   MONOABS 0.7  --   EOSABS 0.0  --   BASOSABS 0.0  --     Recent Labs  Lab 12/11/22 2341 12/12/22 0818 12/12/22 1816 12/13/22 0518 12/13/22 0524  NA 134* 135  --   --  139  K 2.7* 3.5  --   --  3.1*  CL 98 102  --   --  105  CO2 23 26  --   --  22  ANIONGAP 13 7  --   --  12  GLUCOSE 203* 224*  --   --  150*  BUN 13 13  --   --  10  CREATININE 1.12* 1.04*  --   --  1.24*  AST 11*  --   --   --   --   ALT 13  --   --   --   --   ALKPHOS 55  --   --   --   --   BILITOT 0.9  --   --   --   --   ALBUMIN 3.8  --   --   --   --   HGBA1C  --   --  8.5*  --   --   MG 1.4* 2.6*  --  2.0  --   CALCIUM 9.4 8.1*  --   --  8.3*      Recent Labs  Lab 12/11/22 2341 12/12/22 0818 12/12/22 1816 12/13/22 0518 12/13/22 0524  HGBA1C  --   --  8.5*  --   --   MG 1.4* 2.6*  --  2.0  --   CALCIUM 9.4 8.1*  --   --  8.3*    --------------------------------------------------------------------------------------------------------------- Lab Results  Component Value Date   CHOL 137 05/22/2018   HDL 42 05/22/2018   LDLCALC 73 05/22/2018   TRIG 112 05/22/2018   CHOLHDL 3.3 05/22/2018    Lab Results  Component Value Date   HGBA1C  8.5 (H) 12/12/2022     Micro Results Recent Results (from the past 240 hour(s))  Resp panel by RT-PCR (RSV, Flu A&B, Covid) Anterior Nasal Swab     Status: None   Collection Time: 12/11/22  7:50 PM   Specimen: Anterior Nasal Swab  Result Value Ref Range Status   SARS Coronavirus 2 by RT PCR NEGATIVE NEGATIVE Final    Comment: (NOTE) SARS-CoV-2 target nucleic acids are NOT DETECTED.  The SARS-CoV-2 RNA is generally detectable in upper respiratory specimens during the acute phase of infection. The lowest concentration of SARS-CoV-2 viral copies this assay can detect is 138 copies/mL. A negative result does not preclude SARS-Cov-2 infection and should not be used as the sole basis for treatment or other patient management decisions. A negative result may occur with  improper specimen collection/handling, submission of specimen other than nasopharyngeal swab, presence of viral mutation(s) within the areas targeted by this assay, and inadequate number of viral copies(<138 copies/mL). A negative result must be combined with clinical observations, patient history, and epidemiological information. The expected result is Negative.  Fact Sheet for Patients:  BloggerCourse.com  Fact Sheet for Healthcare Providers:  SeriousBroker.it  This test is no t yet approved or cleared by the [place] FDA and  has been authorized for detection and/or diagnosis of SARS-CoV-2 by FDA under an Emergency Use Authorization (EUA). This EUA will remain  in effect (meaning this test can be used) for the duration of the COVID-19 declaration under Section 564(b)(1) of the Act, 21 U.S.C.section 360bbb-3(b)(1), unless the authorization is terminated  or revoked sooner.       Influenza A by PCR NEGATIVE NEGATIVE Final   Influenza B by PCR NEGATIVE NEGATIVE Final    Comment: (NOTE) The Xpert Xpress SARS-CoV-2/FLU/RSV plus assay is intended as an aid in  the diagnosis of influenza from Nasopharyngeal swab specimens and should not be used as a sole basis  for treatment. Nasal washings and aspirates are unacceptable for Xpert Xpress SARS-CoV-2/FLU/RSV testing.  Fact Sheet for Patients: BloggerCourse.com  Fact Sheet for Healthcare Providers: SeriousBroker.it  This test is not yet approved or cleared by the [place] FDA and has been authorized for detection and/or diagnosis of SARS-CoV-2 by FDA under an Emergency Use Authorization (EUA). This EUA will remain in effect (meaning this test can be used) for the duration of the COVID-19 declaration under Section 564(b)(1) of the Act, 21 U.S.C. section 360bbb-3(b)(1), unless the authorization is terminated or revoked.     Resp Syncytial Virus by PCR NEGATIVE NEGATIVE Final    Comment: (NOTE) Fact Sheet for Patients: BloggerCourse.com  Fact Sheet for Healthcare Providers: SeriousBroker.it  This test is not yet approved or cleared by the [place] FDA and has been authorized for detection and/or diagnosis of SARS-CoV-2 by FDA under an Emergency Use Authorization (EUA). This EUA will remain in effect (meaning this test can be used) for the duration of the COVID-19 declaration under Section 564(b)(1) of the Act, 21 U.S.C. section 360bbb-3(b)(1), unless the authorization is terminated or revoked.  Performed at [facility], [address], [place], [place] [address]     Radiology Reports CT ABDOMEN PELVIS W CONTRAST  Result Date: 12/12/2022 CLINICAL DATA:  Abdominal pain, acute, nonlocalized, nausea, vomiting EXAM: CT ABDOMEN AND PELVIS WITH CONTRAST TECHNIQUE: Multidetector CT imaging of the abdomen and pelvis was performed using the standard protocol following bolus administration of intravenous contrast. RADIATION DOSE REDUCTION: This exam was performed  according to the departmental dose-optimization program which includes automated exposure control, adjustment of the mA and/or kV according to patient size and/or use of iterative reconstruction technique. CONTRAST:  OMNIPAQUE IOHEXOL 300 MG/ML  SOLN COMPARISON:  02/17/2020 FINDINGS: Lower chest: No acute findings. Hepatobiliary: Prior cholecystectomy. Diffuse fatty infiltration of the liver. No focal hepatic abnormality. Pancreas: No focal abnormality or ductal dilatation. Spleen: No focal abnormality.  Normal size. Adrenals/Urinary Tract: No adrenal abnormality. No focal renal abnormality. No stones or hydronephrosis. Urinary bladder is unremarkable. Stomach/Bowel: Stomach, large and small bowel grossly unremarkable. Normal appendix. Vascular/Lymphatic: No evidence of aneurysm or adenopathy. Reproductive: Uterus and adnexa unremarkable.  No mass. Other: No free fluid or free air. Musculoskeletal: No acute bony abnormality. IMPRESSION: No acute findings in the abdomen or pelvis. Hepatic steatosis. Electronically Signed   By: Charlett Nose M.D.   On: 12/12/2022 03:38      Signature  -   Susa Raring M.D on 12/13/2022 at 7:47 AM   -  To page go to [email or web address]

## 2022-12-13 NOTE — Plan of Care (Signed)

## 2022-12-14 ENCOUNTER — Other Ambulatory Visit (HOSPITAL_COMMUNITY): Payer: Self-pay

## 2022-12-14 DIAGNOSIS — R112 Nausea with vomiting, unspecified: Secondary | ICD-10-CM | POA: Diagnosis not present

## 2022-12-14 DIAGNOSIS — R197 Diarrhea, unspecified: Secondary | ICD-10-CM | POA: Diagnosis not present

## 2022-12-14 LAB — CBC WITH DIFFERENTIAL/PLATELET
Abs Immature Granulocytes: 0.04 10*3/uL (ref 0.00–0.07)
Basophils Absolute: 0 10*3/uL (ref 0.0–0.1)
Basophils Relative: 0 %
Eosinophils Absolute: 0.1 10*3/uL (ref 0.0–0.5)
Eosinophils Relative: 1 %
HCT: 38.6 % (ref 36.0–46.0)
Hemoglobin: 13 g/dL (ref 12.0–15.0)
Immature Granulocytes: 1 %
Lymphocytes Relative: 16 %
Lymphs Abs: 1.2 10*3/uL (ref 0.7–4.0)
MCH: 25.8 pg — ABNORMAL LOW (ref 26.0–34.0)
MCHC: 33.7 g/dL (ref 30.0–36.0)
MCV: 76.7 fL — ABNORMAL LOW (ref 80.0–100.0)
Monocytes Absolute: 0.7 10*3/uL (ref 0.1–1.0)
Monocytes Relative: 9 %
Neutro Abs: 5.5 10*3/uL (ref 1.7–7.7)
Neutrophils Relative %: 73 %
Platelets: 237 10*3/uL (ref 150–400)
RBC: 5.03 MIL/uL (ref 3.87–5.11)
RDW: 14.2 % (ref 11.5–15.5)
WBC: 7.5 10*3/uL (ref 4.0–10.5)
nRBC: 0 % (ref 0.0–0.2)

## 2022-12-14 LAB — GLUCOSE, CAPILLARY: Glucose-Capillary: 152 mg/dL — ABNORMAL HIGH (ref 70–99)

## 2022-12-14 LAB — MAGNESIUM: Magnesium: 2 mg/dL (ref 1.7–2.4)

## 2022-12-14 LAB — BASIC METABOLIC PANEL
Anion gap: 8 (ref 5–15)
BUN: 11 mg/dL (ref 6–20)
CO2: 22 mmol/L (ref 22–32)
Calcium: 8.5 mg/dL — ABNORMAL LOW (ref 8.9–10.3)
Chloride: 106 mmol/L (ref 98–111)
Creatinine, Ser: 1.06 mg/dL — ABNORMAL HIGH (ref 0.44–1.00)
GFR, Estimated: >60 mL/min (ref >60)
Glucose, Bld: 139 mg/dL — ABNORMAL HIGH (ref 70–99)
Potassium: 3.1 mmol/L — ABNORMAL LOW (ref 3.5–5.1)
Sodium: 136 mmol/L (ref 135–145)

## 2022-12-14 MED ORDER — POTASSIUM CHLORIDE CRYS ER 20 MEQ PO TBCR
40.0000 meq | EXTENDED_RELEASE_TABLET | Freq: Once | ORAL | Status: AC
  Administered 2022-12-14: 40 meq via ORAL
  Filled 2022-12-14: qty 2

## 2022-12-14 MED ORDER — CARVEDILOL 6.25 MG PO TABS
6.2500 mg | ORAL_TABLET | Freq: Two times a day (BID) | ORAL | Status: DC
  Administered 2022-12-14: 6.25 mg via ORAL
  Filled 2022-12-14: qty 1

## 2022-12-14 MED ORDER — AMLODIPINE-OLMESARTAN 10-40 MG PO TABS: 1.0000 | ORAL_TABLET | Freq: Every day | ORAL | Status: DC

## 2022-12-14 MED ORDER — CARVEDILOL 3.125 MG PO TABS
3.1250 mg | ORAL_TABLET | Freq: Two times a day (BID) | ORAL | 0 refills | Status: DC
  Filled 2022-12-14: qty 60, 30d supply, fill #0

## 2022-12-14 NOTE — Plan of Care (Signed)
  Problem: Education: Goal: Knowledge of General Education information will improve Description: Including pain rating scale, medication(s)/side effects and non-pharmacologic comfort measures Outcome: Completed/Met   Problem: Health Behavior/Discharge Planning: Goal: Ability to manage health-related needs will improve Outcome: Completed/Met   Problem: Clinical Measurements: Goal: Ability to maintain clinical measurements within normal limits will improve Outcome: Completed/Met Goal: Will remain free from infection Outcome: Completed/Met Goal: Diagnostic test results will improve Outcome: Completed/Met Goal: Respiratory complications will improve Outcome: Completed/Met Goal: Cardiovascular complication will be avoided Outcome: Completed/Met   Problem: Activity: Goal: Risk for activity intolerance will decrease Outcome: Completed/Met   Problem: Nutrition: Goal: Adequate nutrition will be maintained Outcome: Completed/Met   Problem: Coping: Goal: Level of anxiety will decrease Outcome: Completed/Met   Problem: Elimination: Goal: Will not experience complications related to bowel motility Outcome: Completed/Met Goal: Will not experience complications related to urinary retention Outcome: Completed/Met   Problem: Pain Management: Goal: General experience of comfort will improve Outcome: Completed/Met   Problem: Safety: Goal: Ability to remain free from injury will improve Outcome: Completed/Met   Problem: Skin Integrity: Goal: Risk for impaired skin integrity will decrease Outcome: Completed/Met   Problem: Education: Goal: Ability to describe self-care measures that may prevent or decrease complications (Diabetes Survival Skills Education) will improve Outcome: Completed/Met Goal: Individualized Educational Video(s) Outcome: Completed/Met   Problem: Coping: Goal: Ability to adjust to condition or change in health will improve Outcome: Completed/Met   Problem:  Fluid Volume: Goal: Ability to maintain a balanced intake and output will improve Outcome: Completed/Met   Problem: Health Behavior/Discharge Planning: Goal: Ability to identify and utilize available resources and services will improve Outcome: Completed/Met Goal: Ability to manage health-related needs will improve Outcome: Completed/Met   Problem: Metabolic: Goal: Ability to maintain appropriate glucose levels will improve Outcome: Completed/Met   Problem: Nutritional: Goal: Maintenance of adequate nutrition will improve Outcome: Completed/Met Goal: Progress toward achieving an optimal weight will improve Outcome: Completed/Met   Problem: Skin Integrity: Goal: Risk for impaired skin integrity will decrease Outcome: Completed/Met   Problem: Tissue Perfusion: Goal: Adequacy of tissue perfusion will improve Outcome: Completed/Met

## 2022-12-14 NOTE — Plan of Care (Signed)
                                      [facility]                            [address] [place], [place] [address]      Whitney [name] was admitted to the Hospital on 12/11/2022 and Discharged  12/14/2022 and should be excused from work/school   for 3 days starting from date -  12/11/2022 , may return to work/school without any restrictions.  Call Susa Raring MD, [facility]  [phone] with questions.  Susa Raring M.D on 12/14/2022,at 7:42 AM  [facility]   Office  [phone]

## 2022-12-14 NOTE — Plan of Care (Signed)
  Problem: Education: Goal: Knowledge of General Education information will improve Description: Including pain rating scale, medication(s)/side effects and non-pharmacologic comfort measures Outcome: Progressing   Problem: Health Behavior/Discharge Planning: Goal: Ability to manage health-related needs will improve Outcome: Progressing   Problem: Clinical Measurements: Goal: Ability to maintain clinical measurements within normal limits will improve Outcome: Progressing Goal: Will remain free from infection Outcome: Progressing Goal: Diagnostic test results will improve Outcome: Progressing   Problem: Safety: Goal: Ability to remain free from injury will improve Outcome: Progressing   Problem: Skin Integrity: Goal: Risk for impaired skin integrity will decrease Outcome: Progressing   

## 2022-12-14 NOTE — TOC Transition Note (Signed)
Transition of Care [facility]) - CM/SW Discharge Note   Patient Details  Name: Whitney [name] MRN: [id number] Date of Birth: 29-Sep-1985  [facility] of [facility]) CM/SW Contact:  Gordy Clement, RN Phone Number: 12/14/2022, 9:14 AM   Clinical Narrative:    Patient will DC to home today- No TOC needs identified Family to transport           Patient Goals and CMS Choice      Discharge Placement                         Discharge Plan and Services Additional resources added to the After Visit Summary for                                       Social Determinants of Health (SDOH) Interventions SDOH Screenings   Food Insecurity: No Food Insecurity (12/12/2022)  Housing: Low Risk  (12/12/2022)  Transportation Needs: No Transportation Needs (12/12/2022)  Utilities: Not At Risk (12/12/2022)  Alcohol Screen: Low Risk  (05/26/2020)  Depression (PHQ2-9): Low Risk  (05/26/2020)  Financial Resource Strain: Medium Risk (10/05/2020)  Physical Activity: Inactive (05/26/2020)  Stress: Stress Concern Present (05/26/2020)  Tobacco Use: Medium Risk (12/13/2022)     Readmission Risk Interventions     No data to display

## 2022-12-14 NOTE — Discharge Summary (Signed)
Whitney [name] LOV:[id number] DOB: 1985-11-15 DOA: 12/11/2022  PCP: Pcp, No  Admit date: 12/11/2022  Discharge date: 12/14/2022  Admitted From: Home   Disposition:  Home   Recommendations for Outpatient Follow-up:   Follow up with PCP in 1-2 weeks  PCP Please obtain BMP/CBC, 2 view CXR in 1week,  (see Discharge instructions)   PCP Please follow up on the following pending results:    Home Health: None   Equipment/Devices: None  Consultations: None  Discharge Condition: Stable    CODE STATUS: Full    Diet Recommendation: Heart Healthy Low Carb    Chief Complaint  Patient presents with   Vomiting     Brief history of present illness from the day of admission and additional interim summary    37 y.o. female with medical history significant of hypertension, diabetes mellitus type 2, OSA, DVT/PE on Xarelto who presents with complaints of nausea, vomiting, and diarrhea.  Symptoms started yesterday morning at around 4 AM.  Patient reported having 2 egg rolls from [employer] for dinner the night prior.  Emesis was initially of what she had eaten for dinner, but thereafter reported vomiting up yellow bile.  Soon thereafter she developed crampy lower abdominal pain and generalized bodyaches.  Reported having subjective fevers as well as diarrhea.  In the ER she was diagnosed with gastroenteritis, hypertensive crisis and admitted to the hospital.                                                                  Hospital Course   Gastroenteritis.  Likely due to food poisoning at a restaurant, overall symptoms improved, no nausea, no further diarrhea, abdominal exam benign, CT benign.  Continue to monitor with supportive care.  All symptoms resolved and she is symptom-free now eager to go home.   Hypertensive crisis.   Medications adjusted, much improved PCP to monitor and adjust further in 7 to 10 days.   AKI.  Likely due to dehydration from #1 above. reolved, continue home regimen from tomorrow for blood pressure control, received blood pressure medications here today already.   Hypokalemia.  Replaced, PCP to recheck in 7 to 10 days.   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   Morbid obesity.  BMI of 47.  Follow-up with PCP.   DM type II.  continue home regimen and follow-up with PCP for glycemic control.  Discharge diagnosis     Principal Problem:   Nausea, vomiting, and diarrhea Active Problems:   Hypertensive emergency   Elevated troponin   Uncontrolled type 2 diabetes mellitus with hyperglycemia, with long-term current use of insulin (HCC)   History of pulmonary embolism   Chronic anticoagulation   History of DVT (deep vein thrombosis)   Morbid obesity (HCC)    Discharge  instructions    Discharge Instructions     Diet - low sodium heart healthy   Complete by: As directed    Discharge instructions   Complete by: As directed    Follow with Primary MD in 7 days   Get CBC, CMP, 2 view Chest X ray -  checked next visit with your primary MD    Activity: As tolerated with Full fall precautions use walker/cane & assistance as needed  Disposition Home   Diet: Heart Healthy Low Carb, check CBGs QAC-HS  Special Instructions: If you have smoked or chewed Tobacco  in the last 2 yrs please stop smoking, stop any regular Alcohol  and or any Recreational drug use.  On your next visit with your primary care physician please Get Medicines reviewed and adjusted.  Please request your Prim.MD to go over all Hospital Tests and Procedure/Radiological results at the follow up, please get all Hospital records sent to your Prim MD by signing hospital release before you go home.  If you experience worsening of your admission symptoms, develop shortness of breath, life threatening emergency,  suicidal or homicidal thoughts you must seek medical attention immediately by calling 911 or calling your MD immediately  if symptoms less severe.  You Must read complete instructions/literature along with all the possible adverse reactions/side effects for all the Medicines you take and that have been prescribed to you. Take any new Medicines after you have completely understood and accpet all the possible adverse reactions/side effects.   Do not drive when taking Pain medications.  Do not take more than prescribed Pain, Sleep and Anxiety Medications   Increase activity slowly   Complete by: As directed        Discharge Medications   Allergies as of 12/14/2022   No Known Allergies      Medication List     TAKE these medications    amLODipine-olmesartan 10-40 MG tablet Commonly known as: AZOR Take 1 tablet by mouth daily. Start taking on: December 15, 2022   carvedilol 3.125 MG tablet Commonly known as: Coreg Take 1 tablet (3.125 mg total) by mouth 2 (two) times daily.   dapagliflozin propanediol 10 MG Tabs tablet Commonly known as: Farxiga Take 1 tablet (10 mg total) by mouth daily before breakfast.   Hair/Skin/Nails Tabs Take 1 tablet by mouth daily with breakfast.   Lantus SoloStar 100 UNIT/ML Solostar Pen Generic drug: insulin glargine Inject 15 Units into the skin daily.   metFORMIN 500 MG 24 hr tablet Commonly known as: GLUCOPHAGE-XR Take 2 tablets (1,000 mg total) by mouth daily with breakfast.   OneTouch Delica Plus Lancet33G Misc 1 each by Other route as directed.   OneTouch Verio test strip Generic drug: glucose blood 1 each by Other route 2 (two) times daily. And lancets 2/day   rivaroxaban 10 MG Tabs tablet Commonly known as: XARELTO Take 1 tablet (10 mg total) by mouth daily.         Follow-up Information     [facility]. Schedule an appointment as soon as possible for a visit in 1 week(s).   Contact  information: [address] [place] [address] [phone]                Major procedures and Radiology Reports - PLEASE review detailed and final reports thoroughly  -     CT ABDOMEN PELVIS W CONTRAST  Result Date: 12/12/2022 CLINICAL DATA:  Abdominal pain, acute, nonlocalized,  nausea, vomiting EXAM: CT ABDOMEN AND PELVIS WITH CONTRAST TECHNIQUE: Multidetector CT imaging of the abdomen and pelvis was performed using the standard protocol following bolus administration of intravenous contrast. RADIATION DOSE REDUCTION: This exam was performed according to the departmental dose-optimization program which includes automated exposure control, adjustment of the mA and/or kV according to patient size and/or use of iterative reconstruction technique. CONTRAST:  OMNIPAQUE IOHEXOL 300 MG/ML  SOLN COMPARISON:  02/17/2020 FINDINGS: Lower chest: No acute findings. Hepatobiliary: Prior cholecystectomy. Diffuse fatty infiltration of the liver. No focal hepatic abnormality. Pancreas: No focal abnormality or ductal dilatation. Spleen: No focal abnormality.  Normal size. Adrenals/Urinary Tract: No adrenal abnormality. No focal renal abnormality. No stones or hydronephrosis. Urinary bladder is unremarkable. Stomach/Bowel: Stomach, large and small bowel grossly unremarkable. Normal appendix. Vascular/Lymphatic: No evidence of aneurysm or adenopathy. Reproductive: Uterus and adnexa unremarkable.  No mass. Other: No free fluid or free air. Musculoskeletal: No acute bony abnormality. IMPRESSION: No acute findings in the abdomen or pelvis. Hepatic steatosis. Electronically Signed   By: Charlett Nose M.D.   On: 12/12/2022 03:38    Micro Results    Recent Results (from the past 240 hour(s))  Resp panel by RT-PCR (RSV, Flu A&B, Covid) Anterior Nasal Swab     Status: None   Collection Time: 12/11/22  7:50 PM   Specimen: Anterior Nasal Swab  Result Value Ref Range Status    SARS Coronavirus 2 by RT PCR NEGATIVE NEGATIVE Final    Comment: (NOTE) SARS-CoV-2 target nucleic acids are NOT DETECTED.  The SARS-CoV-2 RNA is generally detectable in upper respiratory specimens during the acute phase of infection. The lowest concentration of SARS-CoV-2 viral copies this assay can detect is 138 copies/mL. A negative result does not preclude SARS-Cov-2 infection and should not be used as the sole basis for treatment or other patient management decisions. A negative result may occur with  improper specimen collection/handling, submission of specimen other than nasopharyngeal swab, presence of viral mutation(s) within the areas targeted by this assay, and inadequate number of viral copies(<138 copies/mL). A negative result must be combined with clinical observations, patient history, and epidemiological information. The expected result is Negative.  Fact Sheet for Patients:  BloggerCourse.com  Fact Sheet for Healthcare Providers:  SeriousBroker.it  This test is no t yet approved or cleared by the [place] FDA and  has been authorized for detection and/or diagnosis of SARS-CoV-2 by FDA under an Emergency Use Authorization (EUA). This EUA will remain  in effect (meaning this test can be used) for the duration of the COVID-19 declaration under Section 564(b)(1) of the Act, 21 U.S.C.section 360bbb-3(b)(1), unless the authorization is terminated  or revoked sooner.       Influenza A by PCR NEGATIVE NEGATIVE Final   Influenza B by PCR NEGATIVE NEGATIVE Final    Comment: (NOTE) The Xpert Xpress SARS-CoV-2/FLU/RSV plus assay is intended as an aid in the diagnosis of influenza from Nasopharyngeal swab specimens and should not be used as a sole basis for treatment. Nasal washings and aspirates are unacceptable for Xpert Xpress SARS-CoV-2/FLU/RSV testing.  Fact Sheet for  Patients: BloggerCourse.com  Fact Sheet for Healthcare Providers: SeriousBroker.it  This test is not yet approved or cleared by the [place] FDA and has been authorized for detection and/or diagnosis of SARS-CoV-2 by FDA under an Emergency Use Authorization (EUA). This EUA will remain in effect (meaning this test can be used) for the duration of the COVID-19 declaration under Section 564(b)(1) of the  Act, 21 U.S.C. section 360bbb-3(b)(1), unless the authorization is terminated or revoked.     Resp Syncytial Virus by PCR NEGATIVE NEGATIVE Final    Comment: (NOTE) Fact Sheet for Patients: BloggerCourse.com  Fact Sheet for Healthcare Providers: SeriousBroker.it  This test is not yet approved or cleared by the [place] FDA and has been authorized for detection and/or diagnosis of SARS-CoV-2 by FDA under an Emergency Use Authorization (EUA). This EUA will remain in effect (meaning this test can be used) for the duration of the COVID-19 declaration under Section 564(b)(1) of the Act, 21 U.S.C. section 360bbb-3(b)(1), unless the authorization is terminated or revoked.  Performed at [facility], [address] [place], [place] [address]     Today   Subjective    Corretta Sala today has no headache,no chest abdominal pain,no new weakness tingling or numbness, feels much better wants to go home today.     Objective   Blood pressure 160/90, pulse 90, temperature 99.1 F (37.3 C), temperature source Oral, resp. rate 18, height 5\' 11"  (1.803 m), weight (!) 155.6 kg, SpO2 98%.   Intake/Output Summary (Last 24 hours) at 12/14/2022 0718 Last data filed at 12/14/2022 0522 Gross per 24 hour  Intake 1628.16 ml  Output 2100 ml  Net -471.84 ml    Exam  Awake Alert, No new F.N deficits,    [place].AT,PERRAL Supple Neck,   Symmetrical Chest wall movement,  Good air movement bilaterally, CTAB RRR,No Gallops,   +ve B.Sounds, Abd Soft, Non tender,  No Cyanosis, Clubbing or edema    Data Review   Recent Labs  Lab 12/11/22 2341 12/13/22 0524 12/14/22 0625  WBC 10.3 8.3 7.5  HGB 13.7 12.7 13.0  HCT 40.0 38.8 38.6  PLT 283 229 237  MCV 76.0* 78.7* 76.7*  MCH 26.0 25.8* 25.8*  MCHC 34.3 32.7 33.7  RDW 13.7 14.3 14.2  LYMPHSABS 0.7  --  1.2  MONOABS 0.7  --  0.7  EOSABS 0.0  --  0.1  BASOSABS 0.0  --  0.0    Recent Labs  Lab 12/11/22 2341 12/12/22 0818 12/12/22 1816 12/13/22 0518 12/13/22 0524 12/14/22 0625  NA 134* 135  --   --  139 136  K 2.7* 3.5  --   --  3.1* 3.1*  CL 98 102  --   --  105 106  CO2 23 26  --   --  22 22  ANIONGAP 13 7  --   --  12 8  GLUCOSE 203* 224*  --   --  150* 139*  BUN 13 13  --   --  10 11  CREATININE 1.12* 1.04*  --   --  1.24* 1.06*  AST 11*  --   --   --   --   --   ALT 13  --   --   --   --   --   ALKPHOS 55  --   --   --   --   --   BILITOT 0.9  --   --   --   --   --   ALBUMIN 3.8  --   --   --   --   --   HGBA1C  --   --  8.5*  --   --   --   MG 1.4* 2.6*  --  2.0  --  2.0  CALCIUM 9.4 8.1*  --   --  8.3* 8.5*  Total Time in preparing paper work, data evaluation and todays exam - 35 minutes  Signature  -    Susa Raring M.D on 12/14/2022 at 7:18 AM   -  To page go to [email or web address]

## 2022-12-14 NOTE — Discharge Instructions (Signed)
Follow with Primary MD in 7 days   Get CBC, CMP, 2 view Chest X ray -  checked next visit with your primary MD    Activity: As tolerated with Full fall precautions use walker/cane & assistance as needed  Disposition Home   Diet: Heart Healthy Low Carb, check CBGs QAC-HS  Special Instructions: If you have smoked or chewed Tobacco  in the last 2 yrs please stop smoking, stop any regular Alcohol  and or any Recreational drug use.  On your next visit with your primary care physician please Get Medicines reviewed and adjusted.  Please request your Prim.MD to go over all Hospital Tests and Procedure/Radiological results at the follow up, please get all Hospital records sent to your Prim MD by signing hospital release before you go home.  If you experience worsening of your admission symptoms, develop shortness of breath, life threatening emergency, suicidal or homicidal thoughts you must seek medical attention immediately by calling 911 or calling your MD immediately  if symptoms less severe.  You Must read complete instructions/literature along with all the possible adverse reactions/side effects for all the Medicines you take and that have been prescribed to you. Take any new Medicines after you have completely understood and accpet all the possible adverse reactions/side effects.   Do not drive when taking Pain medications.  Do not take more than prescribed Pain, Sleep and Anxiety Medications

## 2023-02-26 ENCOUNTER — Emergency Department (HOSPITAL_COMMUNITY)
Admission: EM | Admit: 2023-02-26 | Discharge: 2023-02-27 | Disposition: A | Discharge status: Home / Self Care | Payer: 59

## 2023-02-26 ENCOUNTER — Encounter (HOSPITAL_COMMUNITY): Payer: Self-pay

## 2023-02-26 ENCOUNTER — Ambulatory Visit (HOSPITAL_COMMUNITY)
Admission: RE | Admit: 2023-02-26 | Discharge: 2023-02-26 | Disposition: A | Discharge status: Home / Self Care | Payer: PRIVATE HEALTH INSURANCE | Source: Ambulatory Visit

## 2023-02-26 ENCOUNTER — Encounter (HOSPITAL_COMMUNITY): Payer: Self-pay | Admitting: Emergency Medicine

## 2023-02-26 ENCOUNTER — Emergency Department (HOSPITAL_COMMUNITY): Payer: 59

## 2023-02-26 VITALS — BP 220/150 | HR 94 | Temp 98.5°F | Resp 20

## 2023-02-26 DIAGNOSIS — E119 Type 2 diabetes mellitus without complications: Secondary | ICD-10-CM | POA: Diagnosis not present

## 2023-02-26 DIAGNOSIS — R0602 Shortness of breath: Secondary | ICD-10-CM | POA: Diagnosis not present

## 2023-02-26 DIAGNOSIS — Z794 Long term (current) use of insulin: Secondary | ICD-10-CM | POA: Diagnosis not present

## 2023-02-26 DIAGNOSIS — Z6841 Body Mass Index (BMI) 40.0 and over, adult: Secondary | ICD-10-CM | POA: Diagnosis not present

## 2023-02-26 DIAGNOSIS — Z20822 Contact with and (suspected) exposure to covid-19: Secondary | ICD-10-CM | POA: Insufficient documentation

## 2023-02-26 DIAGNOSIS — I160 Hypertensive urgency: Secondary | ICD-10-CM

## 2023-02-26 DIAGNOSIS — R059 Cough, unspecified: Secondary | ICD-10-CM | POA: Diagnosis present

## 2023-02-26 DIAGNOSIS — Z79899 Other long term (current) drug therapy: Secondary | ICD-10-CM | POA: Insufficient documentation

## 2023-02-26 DIAGNOSIS — E669 Obesity, unspecified: Secondary | ICD-10-CM | POA: Diagnosis not present

## 2023-02-26 DIAGNOSIS — I10 Essential (primary) hypertension: Secondary | ICD-10-CM | POA: Insufficient documentation

## 2023-02-26 DIAGNOSIS — J101 Influenza due to other identified influenza virus with other respiratory manifestations: Secondary | ICD-10-CM | POA: Diagnosis not present

## 2023-02-26 LAB — CBC WITH DIFFERENTIAL/PLATELET
Abs Immature Granulocytes: 0.01 10*3/uL (ref 0.00–0.07)
Basophils Absolute: 0 10*3/uL (ref 0.0–0.1)
Basophils Relative: 1 %
Eosinophils Absolute: 0.1 10*3/uL (ref 0.0–0.5)
Eosinophils Relative: 2 %
HCT: 42.1 % (ref 36.0–46.0)
Hemoglobin: 14.1 g/dL (ref 12.0–15.0)
Immature Granulocytes: 0 %
Lymphocytes Relative: 37 %
Lymphs Abs: 2.1 10*3/uL (ref 0.7–4.0)
MCH: 26.4 pg (ref 26.0–34.0)
MCHC: 33.5 g/dL (ref 30.0–36.0)
MCV: 78.8 fL — ABNORMAL LOW (ref 80.0–100.0)
Monocytes Absolute: 0.6 10*3/uL (ref 0.1–1.0)
Monocytes Relative: 11 %
Neutro Abs: 2.8 10*3/uL (ref 1.7–7.7)
Neutrophils Relative %: 49 %
Platelets: 250 10*3/uL (ref 150–400)
RBC: 5.34 MIL/uL — ABNORMAL HIGH (ref 3.87–5.11)
RDW: 13.4 % (ref 11.5–15.5)
WBC: 5.8 10*3/uL (ref 4.0–10.5)
nRBC: 0 % (ref 0.0–0.2)

## 2023-02-26 LAB — COMPREHENSIVE METABOLIC PANEL
ALT: 21 U/L (ref 0–44)
AST: 21 U/L (ref 15–41)
Albumin: 3.5 g/dL (ref 3.5–5.0)
Alkaline Phosphatase: 57 U/L (ref 38–126)
Anion gap: 13 (ref 5–15)
BUN: 13 mg/dL (ref 6–20)
CO2: 26 mmol/L (ref 22–32)
Calcium: 10.4 mg/dL — ABNORMAL HIGH (ref 8.9–10.3)
Chloride: 100 mmol/L (ref 98–111)
Creatinine, Ser: 1.4 mg/dL — ABNORMAL HIGH (ref 0.44–1.00)
GFR, Estimated: 49 mL/min — ABNORMAL LOW (ref >60)
Glucose, Bld: 169 mg/dL — ABNORMAL HIGH (ref 70–99)
Potassium: 3.2 mmol/L — ABNORMAL LOW (ref 3.5–5.1)
Sodium: 139 mmol/L (ref 135–145)
Total Bilirubin: 0.5 mg/dL (ref 0.0–1.2)
Total Protein: 7.7 g/dL (ref 6.5–8.1)

## 2023-02-26 LAB — TROPONIN I (HIGH SENSITIVITY)
Troponin I (High Sensitivity): 32 ng/L — ABNORMAL HIGH (ref <18)
Troponin I (High Sensitivity): 33 ng/L — ABNORMAL HIGH (ref <18)

## 2023-02-26 LAB — HCG, SERUM, QUALITATIVE: Preg, Serum: NEGATIVE

## 2023-02-26 NOTE — ED Triage Notes (Addendum)
Pt c/o productive cough and SOB.  Pt began having cough, fatigue, body aches, chills, and loss of appetite on Wednesday. Began feeling better Saturday; however cough has worsened and now feels SOB with exertion.  Of note, pt is hypertensive at 218/122 per manual BP. Taken x2 automatically with similar readings. Manual was taken several minutes later. Pt states she becomes extremely anxious in healthcare setting. Reports she has not taken her meds the last couple days d/t not feeling well. Denies any dizziness or CP. Endorses slight HA.

## 2023-02-26 NOTE — ED Provider Notes (Addendum)
[facility]    CSN: [id number] Arrival date & time: 02/26/23  1700      History   Chief Complaint Chief Complaint  Patient presents with   Cough    Had flu like symptoms starting Wednesday night. Most have subsided except cough and minor chills - Entered by patient    HPI Whitney [name] is a 38 y.o. female.  5 days ago developed dry cough, body aches, chills, fatigue Still having cough but now some production Reporting shortness of breath with exertion  Mild headache. Denies dizziness, vision changes, chest pain.  Hx HTN She reports extreme anxiety in healthcare setting  Past Medical History:  Diagnosis Date   Diabetes mellitus without complication (HCC)    type 2   DVT (deep venous thrombosis) (HCC) 03/2018   Gallstones    Headache(784.0)    Hypertension    OSA (obstructive sleep apnea) 05/26/2020   Sleep apnea    does not need cpap    Patient Active Problem List   Diagnosis Date Noted   Hypertensive emergency 12/12/2022   Elevated troponin 12/12/2022   Uncontrolled type 2 diabetes mellitus with hyperglycemia, with long-term current use of insulin (HCC) 12/12/2022   History of pulmonary embolism 12/12/2022   Chronic anticoagulation 12/12/2022   History of DVT (deep vein thrombosis) 12/12/2022   Hypertensive crisis 03/08/2021   Ovarian cyst 02/16/2020   Obstructive sleep apnea syndrome 01/02/2020   Non-alcoholic fatty liver disease 01/02/2020   Positive antinuclear antibody 01/02/2020   Essential hypertension 11/24/2019   Menorrhagia 11/24/2019   Acute deep vein thrombosis (DVT) of popliteal vein of left lower extremity (HCC)    Pulmonary emboli (HCC) 04/13/2018   Diabetes mellitus (HCC) 01/17/2016   Routine general medical examination at a health care facility 12/27/2015   Morbid obesity (HCC) 12/27/2015   Nausea, vomiting, and diarrhea 03/11/2012    Past Surgical History:  Procedure Laterality Date   CHOLECYSTECTOMY N/A 03/12/2012   Procedure:  LAPAROSCOPIC CHOLECYSTECTOMY;  Surgeon: Shelly Rubenstein, MD;  Location: [facility] OR;  Service: General;  Laterality: N/A;   HYSTEROSCOPY WITH D & C N/A 05/11/2020   Procedure: DILATATION AND CURETTAGE /HYSTEROSCOPY;  Surgeon: Essie Hart, MD;  Location: [facility] OR;  Service: Gynecology;  Laterality: N/A;   LAPAROSCOPY N/A 05/11/2020   Procedure: LAPAROSCOPY OPERATIVE; PELVIC WASHINGS;  Surgeon: Essie Hart, MD;  Location: [facility] OR;  Service: Gynecology;  Laterality: N/A;  OVARIAN CYSTECTOMY    OB History     Gravida  1   Para  1   Term  1   Preterm  0   AB  0   Living  1      SAB  0   IAB  0   Ectopic  0   Multiple  0   Live Births  1            Home Medications    Prior to Admission medications   Medication Sig Start Date End Date Taking? Authorizing Provider  amLODipine-olmesartan (AZOR) 10-40 MG tablet Take 1 tablet by mouth daily. 12/15/22   Leroy Sea, MD  carvedilol (COREG) 3.125 MG tablet Take 1 tablet (3.125 mg total) by mouth 2 (two) times daily. 12/14/22 12/14/23  Leroy Sea, MD  dapagliflozin propanediol (FARXIGA) 10 MG TABS tablet Take 1 tablet (10 mg total) by mouth daily before breakfast. 05/04/22   Elson Areas, PA-C  glucose blood (ONETOUCH VERIO) test strip 1 each by Other route 2 (two) times  daily. And lancets 2/day 05/04/22   Elson Areas, PA-C  insulin glargine (LANTUS SOLOSTAR) 100 UNIT/ML Solostar Pen Inject 15 Units into the skin daily. 05/04/22   Elson Areas, PA-C  Lancets [facility] DELICA PLUS [place]) MISC 1 each by Other route as directed. 05/04/22   Elson Areas, PA-C  metFORMIN (GLUCOPHAGE-XR) 500 MG 24 hr tablet Take 2 tablets (1,000 mg total) by mouth daily with breakfast. 05/04/22   Elson Areas, PA-C  Multiple Vitamins-Minerals (HAIR/SKIN/NAILS) TABS Take 1 tablet by mouth daily with breakfast.    [provider]  rivaroxaban (XARELTO) 10 MG TABS tablet Take 1 tablet (10 mg total) by mouth daily. 05/04/22    Elson Areas, PA-C    Family History Family History  Problem Relation Age of Onset   Healthy Mother    Ulcerative colitis Father    Diabetes Maternal Grandmother    Breast cancer Maternal Grandmother    Hypertension Maternal Grandmother    Syncope episode Maternal Grandfather    Benign prostatic hyperplasia Maternal Grandfather    Brain cancer Paternal Grandmother    Lung cancer Paternal Grandfather    Hypertension Son 8   Anesthesia problems Neg Hx    Hypotension Neg Hx    Malignant hyperthermia Neg Hx    Pseudochol deficiency Neg Hx    Colon cancer Neg Hx    Esophageal cancer Neg Hx    Rectal cancer Neg Hx     Social History Social History   Tobacco Use   Smoking status: Former    Current packs/day: 0.00    Types: Cigarettes    Quit date: 05/24/2011    Years since quitting: 11.7   Smokeless tobacco: Never  Vaping Use   Vaping status: Never Used  Substance Use Topics   Alcohol use: Yes    Comment: Once a month on average   Drug use: No     Allergies   Patient has no known allergies.   Review of Systems Review of Systems Per HPI  Physical Exam Triage Vital Signs ED Triage Vitals  Encounter Vitals Group     BP 02/26/23 1804 (!) 218/122     Systolic BP Percentile --      Diastolic BP Percentile --      Pulse Rate 02/26/23 1741 94     Resp 02/26/23 1741 20     Temp 02/26/23 1741 98.5 F (36.9 C)     Temp Source 02/26/23 1741 Oral     SpO2 --      Weight --      Height --      Head Circumference --      Peak Flow --      Pain Score 02/26/23 1740 0     Pain Loc --      Pain Education --      Exclude from Growth Chart --    No data found.  Updated Vital Signs BP (!) 220/150   Pulse 94   Temp 98.5 F (36.9 C) (Oral)   Resp 20   SpO2 98%   Visual Acuity Right Eye Distance:   Left Eye Distance:   Bilateral Distance:    Right Eye Near:   Left Eye Near:    Bilateral Near:     Physical Exam Vitals and nursing note reviewed.   Constitutional:      General: She is not in acute distress.    Appearance: She is not ill-appearing.  HENT:  Nose: No rhinorrhea.     Mouth/Throat:     Mouth: Mucous membranes are moist.     Pharynx: Oropharynx is clear. No posterior oropharyngeal erythema.  Eyes:     Conjunctiva/sclera: Conjunctivae normal.  Cardiovascular:     Rate and Rhythm: Normal rate and regular rhythm.     Pulses: Normal pulses.     Heart sounds: Normal heart sounds.  Pulmonary:     Effort: Pulmonary effort is normal.     Breath sounds: Normal breath sounds.  Musculoskeletal:     Cervical back: Normal range of motion.  Skin:    General: Skin is warm and dry.  Neurological:     General: No focal deficit present.     Mental Status: She is alert and oriented to person, place, and time.     Motor: No weakness.     Gait: Gait normal.      UC Treatments / Results  Labs (all labs ordered are listed, but only abnormal results are displayed) Labs Reviewed - No data to display  EKG   Radiology No results found.  Procedures Procedures (including critical care time)  Medications Ordered in UC Medications - No data to display  Initial Impression / Assessment and Plan / UC Course  I have reviewed the triage vital signs and the nursing notes.  Pertinent labs & imaging results that were available during my care of the patient were reviewed by me and considered in my medical decision making (see chart for details).  BP 220/151 Patient short of breath walking from waiting room to exam room Likely flu-like symptoms but cannot rule out other more emergent cause with her pressure being this high. Hypertensive urgency.  I have asked her to proceed to the emergency department for higher level of care  Final Clinical Impressions(s) / UC Diagnoses   Final diagnoses:  Shortness of breath  Hypertensive urgency   Discharge Instructions   None    ED Prescriptions   None    PDMP not reviewed this  encounter.     Kathrine Haddock 02/26/23 1610

## 2023-02-26 NOTE — ED Provider Triage Note (Signed)
Emergency Medicine Provider Triage Evaluation Note  Whitney [name] , a 38 y.o. female  was evaluated in triage.  Pt complains of flu like symptoms x 5 days. Majority of symptoms have resolved except for cough, SOB, chest pressure with coughing. Has not taken any of her BP or diabetes meds since she got sick 5 days ago  Review of Systems  Positive: As above, mild headache Negative: Blurry vision  Physical Exam  BP (!) 201/120   Pulse 92   Temp 98.4 F (36.9 C) (Oral)   Resp 18   SpO2 100%  Gen:   Awake, no distress   Resp:  Normal effort  MSK:   Moves extremities without difficulty  Other:    Medical Decision Making  Medically screening exam initiated at 8:14 PM.  Appropriate orders placed.  DORITA ROWLANDS was informed that the remainder of the evaluation will be completed by another provider, this initial triage assessment does not replace that evaluation, and the importance of remaining in the ED until their evaluation is complete.     Su Monks, PA-C 02/26/23 2014

## 2023-02-26 NOTE — ED Notes (Signed)
Patient is being discharged from the Urgent Care and sent to the Emergency Department via POV . Per R. Rising,PA-C, patient is in need of higher level of care due to concern for hypertensive emergency. Patient is aware and verbalizes understanding of plan of care.  Vitals:   02/26/23 1741 02/26/23 1804  BP:  (!) 218/122  Pulse: 94   Resp: 20   Temp: 98.5 F (36.9 C)

## 2023-02-26 NOTE — ED Notes (Signed)
PT discussed leaving. PA and RN encouraged her to stay.

## 2023-02-26 NOTE — ED Triage Notes (Signed)
Pt having flu symptoms since Weds. She reports chest pressure and lingering cough  and hypertension. Sent by UC.

## 2023-02-27 ENCOUNTER — Other Ambulatory Visit: Payer: Self-pay

## 2023-02-27 LAB — RESP PANEL BY RT-PCR (RSV, FLU A&B, COVID)  RVPGX2
Influenza A by PCR: POSITIVE — AB
Influenza B by PCR: NEGATIVE
Resp Syncytial Virus by PCR: NEGATIVE
SARS Coronavirus 2 by RT PCR: NEGATIVE

## 2023-02-27 MED ORDER — HYDROXYZINE HCL 10 MG PO TABS
10.0000 mg | ORAL_TABLET | Freq: Once | ORAL | Status: AC
  Administered 2023-02-27: 10 mg via ORAL
  Filled 2023-02-27: qty 1

## 2023-02-27 MED ORDER — AMLODIPINE BESYLATE 5 MG PO TABS
10.0000 mg | ORAL_TABLET | Freq: Once | ORAL | Status: AC
  Administered 2023-02-27: 10 mg via ORAL
  Filled 2023-02-27: qty 2

## 2023-02-27 MED ORDER — RIVAROXABAN 10 MG PO TABS: 10.0000 mg | ORAL_TABLET | Freq: Every day | ORAL | Status: DC

## 2023-02-27 NOTE — Discharge Instructions (Addendum)
 Please re-start your blood pressure medications. Please continue taking his medications even if you are feeling unwell.  For sinus congestion I recommend taking Zyrtec once daily this is an over-the-counter medication and fluticasone which I have prescribed you 2 sprays into each nostril twice daily.  You can also use sinus rinses however I would recommend that you do the sinus rinses first and then administer the fluticasone.  Make sure you are drinking plenty of water, please follow-up with your primary care doctor in the next 3 or 4 days to have your blood pressure rechecked.  Make sure that you are taking all of your prescribed indications.  Return to the emergency room for any shortness of breath or chest pain or any other new or concerning symptoms.  I have written you a work note to use if needed however you are cleared to return to work.

## 2023-02-27 NOTE — ED Provider Notes (Signed)
 [place] EMERGENCY DEPARTMENT AT [facility] Provider Note   CSN: [id number] Arrival date & time: 02/26/23  1839     History  Chief Complaint  Patient presents with   Shortness of Breath    Whitney [name] is a 38 y.o. female.   Shortness of Breath Patient is a 38 year old female with past medical history significant for obesity, DM 2, pulmonary emboli on Xarelto , nonalcoholic fatty liver disease, hypertensive urgency, emergency, chronic hypertension, elevated troponins  Patient was presented emergency room today with complaints of cough, some sinus congestion, sore throat, fatigue since Wednesday, states that she did have some chest pressure over the past week but none currently.  She denies any chest pain she denies any shortness of breath currently.  She went to urgent care for evaluation and was sent to the emergency room because of her hypertension.  She states she has not been taking her blood pressure medications which include amlodipine /olmesartan , carvedilol , hydralazine .  She states that she is septic in these medications approximately 1 week ago.  She denies any syncope or near syncope, no hemoptysis or unilateral or bilateral leg swelling.     Home Medications Prior to Admission medications   Medication Sig Start Date End Date Taking? Authorizing Provider  amLODipine -olmesartan  (AZOR ) 10-40 MG tablet Take 1 tablet by mouth daily. 12/15/22   Singh, Prashant K, MD  carvedilol  (COREG ) 3.125 MG tablet Take 1 tablet (3.125 mg total) by mouth 2 (two) times daily. 12/14/22 12/14/23  Singh, Prashant K, MD  dapagliflozin  propanediol (FARXIGA ) 10 MG TABS tablet Take 1 tablet (10 mg total) by mouth daily before breakfast. 05/04/22   Sofia, Leslie K, PA-C  glucose blood (ONETOUCH VERIO) test strip 1 each by Other route 2 (two) times daily. And lancets 2/day 05/04/22   Flint Sonny POUR, PA-C  insulin  glargine (LANTUS  SOLOSTAR) 100 UNIT/ML Solostar Pen Inject 15 Units into the  skin daily. 05/04/22   Flint Sonny POUR, PA-C  Lancets [facility] DELICA PLUS [place]) MISC 1 each by Other route as directed. 05/04/22   Sofia, Leslie K, PA-C  metFORMIN  (GLUCOPHAGE -XR) 500 MG 24 hr tablet Take 2 tablets (1,000 mg total) by mouth daily with breakfast. 05/04/22   Sofia, Leslie K, PA-C  Multiple Vitamins-Minerals (HAIR/SKIN/NAILS) TABS Take 1 tablet by mouth daily with breakfast.    [provider]  rivaroxaban  (XARELTO ) 10 MG TABS tablet Take 1 tablet (10 mg total) by mouth daily. 05/04/22   Sofia, Leslie K, PA-C      Allergies    Patient has no known allergies.    Review of Systems   Review of Systems  Respiratory:  Positive for shortness of breath.     Physical Exam Updated Vital Signs BP (!) 215/113   Pulse 91   Temp 98 F (36.7 C)   Resp 18   Wt (!) 155.6 kg   SpO2 97%   BMI 47.84 kg/m  Physical Exam Vitals and nursing note reviewed.  Constitutional:      General: She is not in acute distress.    Appearance: She is obese.     Comments: Pleasant 38 year old female no acute distress able to answer questions appropriately follow commands.  Speaking in full sentences  HENT:     Head: Normocephalic and atraumatic.     Nose: Nose normal.     Mouth/Throat:     Mouth: Mucous membranes are moist.  Eyes:     General: No scleral icterus. Cardiovascular:     Rate  and Rhythm: Normal rate and regular rhythm.     Pulses: Normal pulses.     Heart sounds: Normal heart sounds.  Pulmonary:     Effort: Pulmonary effort is normal. No respiratory distress.     Breath sounds: No wheezing.  Abdominal:     Palpations: Abdomen is soft.     Tenderness: There is no abdominal tenderness. There is no guarding or rebound.  Musculoskeletal:     Cervical back: Normal range of motion.     Right lower leg: No edema.     Left lower leg: No edema.  Skin:    General: Skin is warm and dry.     Capillary Refill: Capillary refill takes less than 2 seconds.  Neurological:      Mental Status: She is alert. Mental status is at baseline.  Psychiatric:        Mood and Affect: Mood normal.        Behavior: Behavior normal.     ED Results / Procedures / Treatments   Labs (all labs ordered are listed, but only abnormal results are displayed) Labs Reviewed  RESP PANEL BY RT-PCR (RSV, FLU A&B, COVID)  RVPGX2 - Abnormal; Notable for the following components:      Result Value   Influenza A by PCR POSITIVE (*)    All other components within normal limits  COMPREHENSIVE METABOLIC PANEL - Abnormal; Notable for the following components:   Potassium 3.2 (*)    Glucose, Bld 169 (*)    Creatinine, Ser 1.40 (*)    Calcium 10.4 (*)    GFR, Estimated 49 (*)    All other components within normal limits  CBC WITH DIFFERENTIAL/PLATELET - Abnormal; Notable for the following components:   RBC 5.34 (*)    MCV 78.8 (*)    All other components within normal limits  TROPONIN I (HIGH SENSITIVITY) - Abnormal; Notable for the following components:   Troponin I (High Sensitivity) 32 (*)    All other components within normal limits  TROPONIN I (HIGH SENSITIVITY) - Abnormal; Notable for the following components:   Troponin I (High Sensitivity) 33 (*)    All other components within normal limits  HCG, SERUM, QUALITATIVE    EKG None  Radiology DG Chest 2 View Result Date: 02/26/2023 CLINICAL DATA:  Shortness of breath.  Flu-like symptoms. EXAM: CHEST - 2 VIEW COMPARISON:  03/08/2021 FINDINGS: The heart size and mediastinal contours are within normal limits. Both lungs are clear. The visualized skeletal structures are unremarkable. IMPRESSION: No active cardiopulmonary disease. Electronically Signed   By: Norleen DELENA Kil M.D.   On: 02/26/2023 20:41    Procedures Procedures    Medications Ordered in ED Medications  hydrOXYzine  (ATARAX ) tablet 10 mg (has no administration in time range)  amLODipine  (NORVASC ) tablet 10 mg (has no administration in time range)  rivaroxaban  (XARELTO )  tablet 10 mg (has no administration in time range)    ED Course/ Medical Decision Making/ A&P                                 Medical Decision Making Amount and/or Complexity of Data Reviewed Labs: ordered. Radiology: ordered.  Risk Prescription drug management.   This patient presents to the ED for concern of cough, congestion fatigue, this involves a number of treatment options, and is a complaint that carries with it a moderate risk of complications and morbidity. A differential diagnosis was considered  for the patient's symptoms which is discussed below:   Differential diagnosis for emergent cause of cough includes but is not limited to upper respiratory infection, lower respiratory infection, allergies, asthma, irritants, foreign body, medications such as ACE inhibitors, reflux, asthma, CHF, lung cancer, interstitial lung disease, psychiatric causes, postnasal drip and postinfectious bronchospasm.    Co morbidities: Discussed in HPI   Brief History:  Patient is a 38 year old female with past medical history significant for obesity, DM 2, pulmonary emboli on Xarelto , nonalcoholic fatty liver disease, hypertensive urgency, emergency, chronic hypertension, elevated troponins  Patient was presented emergency room today with complaints of cough, some sinus congestion, sore throat, fatigue since Wednesday, states that she did have some chest pressure over the past week but none currently.  She denies any chest pain she denies any shortness of breath currently.  She went to urgent care for evaluation and was sent to the emergency room because of her hypertension.  She states she has not been taking her blood pressure medications which include amlodipine /olmesartan , carvedilol , hydralazine .  She states that she is septic in these medications approximately 1 week ago.  She denies any syncope or near syncope, no hemoptysis or unilateral or bilateral leg swelling.    EMR reviewed  including pt PMHx, past surgical history and past visits to ER.   See HPI for more details   Lab Tests:   I ordered and independently interpreted labs. Labs notable for troponin which is approximately at baseline patient has a history of elevated troponins there is no delta troponin and she has no chest pain or difficulty breathing.  CMP notable for mild hypokalemia 3.2.  Creatinine at 1.4 not significantly elevated from baseline.  Recommended hydration and close follow-up monitoring.  CBC without leukocytosis or anemia hCG negative for pregnancy.  Influenza test positive   Imaging Studies:  NAD. I personally reviewed all imaging studies and no acute abnormality found. I agree with radiology interpretation.    Cardiac Monitoring:  The patient was maintained on a cardiac monitor.  I personally viewed and interpreted the cardiac monitored which showed an underlying rhythm of: NSR EKG non-ischemic   Medicines ordered:  I ordered medication including amlodipine , hydroxazine for htn/antihistamine for congestion/anxiety component.  Reevaluation of the patient after these medicines showed that the patient stayed the same I have reviewed the patients home medicines and have made adjustments as needed   Critical Interventions:     Consults/Attending Physician      Reevaluation:  After the interventions noted above I re-evaluated patient and found that they have :stayed the same   Social Determinants of Health:      Problem List / ED Course:  Patient presented emergency room today with cough, congestion, flulike symptoms tested positive for influenza A.  Well-appearing on exam normal neurologic exam, lungs are clear, vital signs normal apart from hypertension with blood pressure as high as 201/120.  Given 1 dose of amlodipine  here and she states that she will restart her blood pressure medications which she has been off for 1 week.  Her troponins are flat and unremarkably  similar to previous lab results.  Patient states that she has no interest in staying here in the emergency room she was seen in urgent care and sent to the emergency room and has been in the waiting room for 12 hours.  She was willing to wait for a brief period of time and had her blood pressure rechecked by me personally and found to have a  blood pressure of 190/100.  There was a documented blood pressure of 193/123.  I suspect that some of these blood pressures are fictitiously elevated because of her obesity large arm size and positioning.  Patient is anxious to be discharged home.  Will discharge her home with close outpatient follow-up.  Strict return precautions to the emergency room provided.   Dispostion:  After consideration of the diagnostic results and the patients response to treatment, I feel that the patent would benefit from discharge home. She will re-start meds.     Final Clinical Impression(s) / ED Diagnoses Final diagnoses:  Influenza A  Hypertension, unspecified type    Rx / DC Orders ED Discharge Orders     None         Neldon Hamp RAMAN, [place] 02/27/23 0859    Neysa Caron PARAS, DO 02/27/23 1615

## 2023-02-27 NOTE — ED Notes (Signed)
Provided with sandwich bag

## 2023-08-31 ENCOUNTER — Encounter (HOSPITAL_COMMUNITY): Payer: Self-pay

## 2023-08-31 ENCOUNTER — Emergency Department (HOSPITAL_COMMUNITY): Payer: PRIVATE HEALTH INSURANCE

## 2023-08-31 ENCOUNTER — Other Ambulatory Visit: Payer: Self-pay

## 2023-08-31 ENCOUNTER — Inpatient Hospital Stay (HOSPITAL_COMMUNITY)
Admission: EM | Admit: 2023-08-31 | Discharge: 2023-09-24 | DRG: 064 | Disposition: E | Payer: PRIVATE HEALTH INSURANCE | Attending: Student in an Organized Health Care Education/Training Program | Admitting: Student in an Organized Health Care Education/Training Program

## 2023-08-31 DIAGNOSIS — T45515A Adverse effect of anticoagulants, initial encounter: Secondary | ICD-10-CM | POA: Diagnosis not present

## 2023-08-31 DIAGNOSIS — I161 Hypertensive emergency: Secondary | ICD-10-CM | POA: Diagnosis present

## 2023-08-31 DIAGNOSIS — G9340 Encephalopathy, unspecified: Secondary | ICD-10-CM | POA: Diagnosis present

## 2023-08-31 DIAGNOSIS — Z515 Encounter for palliative care: Secondary | ICD-10-CM | POA: Diagnosis not present

## 2023-08-31 DIAGNOSIS — Z66 Do not resuscitate: Secondary | ICD-10-CM | POA: Diagnosis present

## 2023-08-31 DIAGNOSIS — Z833 Family history of diabetes mellitus: Secondary | ICD-10-CM

## 2023-08-31 DIAGNOSIS — R29728 NIHSS score 28: Secondary | ICD-10-CM | POA: Diagnosis present

## 2023-08-31 DIAGNOSIS — Z6841 Body Mass Index (BMI) 40.0 and over, adult: Secondary | ICD-10-CM | POA: Diagnosis not present

## 2023-08-31 DIAGNOSIS — Z7901 Long term (current) use of anticoagulants: Secondary | ICD-10-CM

## 2023-08-31 DIAGNOSIS — Z803 Family history of malignant neoplasm of breast: Secondary | ICD-10-CM

## 2023-08-31 DIAGNOSIS — D72829 Elevated white blood cell count, unspecified: Secondary | ICD-10-CM

## 2023-08-31 DIAGNOSIS — G936 Cerebral edema: Secondary | ICD-10-CM | POA: Diagnosis present

## 2023-08-31 DIAGNOSIS — Z86718 Personal history of other venous thrombosis and embolism: Secondary | ICD-10-CM

## 2023-08-31 DIAGNOSIS — Z8249 Family history of ischemic heart disease and other diseases of the circulatory system: Secondary | ICD-10-CM | POA: Diagnosis not present

## 2023-08-31 DIAGNOSIS — I615 Nontraumatic intracerebral hemorrhage, intraventricular: Secondary | ICD-10-CM | POA: Diagnosis present

## 2023-08-31 DIAGNOSIS — G4733 Obstructive sleep apnea (adult) (pediatric): Secondary | ICD-10-CM

## 2023-08-31 DIAGNOSIS — Z7984 Long term (current) use of oral hypoglycemic drugs: Secondary | ICD-10-CM

## 2023-08-31 DIAGNOSIS — E662 Morbid (severe) obesity with alveolar hypoventilation: Secondary | ICD-10-CM | POA: Diagnosis present

## 2023-08-31 DIAGNOSIS — I10 Essential (primary) hypertension: Secondary | ICD-10-CM | POA: Diagnosis present

## 2023-08-31 DIAGNOSIS — Z794 Long term (current) use of insulin: Secondary | ICD-10-CM | POA: Diagnosis not present

## 2023-08-31 DIAGNOSIS — Z808 Family history of malignant neoplasm of other organs or systems: Secondary | ICD-10-CM

## 2023-08-31 DIAGNOSIS — R1310 Dysphagia, unspecified: Secondary | ICD-10-CM | POA: Diagnosis present

## 2023-08-31 DIAGNOSIS — K760 Fatty (change of) liver, not elsewhere classified: Secondary | ICD-10-CM | POA: Diagnosis present

## 2023-08-31 DIAGNOSIS — D684 Acquired coagulation factor deficiency: Secondary | ICD-10-CM | POA: Diagnosis not present

## 2023-08-31 DIAGNOSIS — E1165 Type 2 diabetes mellitus with hyperglycemia: Secondary | ICD-10-CM | POA: Diagnosis present

## 2023-08-31 DIAGNOSIS — G911 Obstructive hydrocephalus: Secondary | ICD-10-CM | POA: Diagnosis present

## 2023-08-31 DIAGNOSIS — E119 Type 2 diabetes mellitus without complications: Secondary | ICD-10-CM | POA: Diagnosis not present

## 2023-08-31 DIAGNOSIS — J9600 Acute respiratory failure, unspecified whether with hypoxia or hypercapnia: Secondary | ICD-10-CM | POA: Diagnosis present

## 2023-08-31 DIAGNOSIS — Z801 Family history of malignant neoplasm of trachea, bronchus and lung: Secondary | ICD-10-CM

## 2023-08-31 DIAGNOSIS — Z5986 Financial insecurity: Secondary | ICD-10-CM

## 2023-08-31 DIAGNOSIS — Z87891 Personal history of nicotine dependence: Secondary | ICD-10-CM

## 2023-08-31 DIAGNOSIS — Z86711 Personal history of pulmonary embolism: Secondary | ICD-10-CM

## 2023-08-31 DIAGNOSIS — Z9911 Dependence on respirator [ventilator] status: Secondary | ICD-10-CM

## 2023-08-31 DIAGNOSIS — I619 Nontraumatic intracerebral hemorrhage, unspecified: Principal | ICD-10-CM

## 2023-08-31 DIAGNOSIS — Z79899 Other long term (current) drug therapy: Secondary | ICD-10-CM

## 2023-08-31 DIAGNOSIS — Z9049 Acquired absence of other specified parts of digestive tract: Secondary | ICD-10-CM

## 2023-08-31 DIAGNOSIS — G935 Compression of brain: Secondary | ICD-10-CM | POA: Diagnosis present

## 2023-08-31 DIAGNOSIS — G9382 Brain death: Secondary | ICD-10-CM | POA: Diagnosis present

## 2023-08-31 LAB — GLUCOSE, CAPILLARY
Glucose-Capillary: 200 mg/dL — ABNORMAL HIGH (ref 70–99)
Glucose-Capillary: 139 mg/dL — ABNORMAL HIGH (ref 70–99)

## 2023-08-31 LAB — POCT I-STAT 7, (LYTES, BLD GAS, ICA,H+H)
Acid-base deficit: 1 mmol/L (ref 0.0–2.0)
Bicarbonate: 25.7 mmol/L (ref 20.0–28.0)
Calcium, Ion: 1.1 mmol/L — ABNORMAL LOW (ref 1.15–1.40)
HCT: 34 % — ABNORMAL LOW (ref 36.0–46.0)
Hemoglobin: 11.6 g/dL — ABNORMAL LOW (ref 12.0–15.0)
O2 Saturation: 100 %
Patient temperature: 36.4
Potassium: 2.9 mmol/L — ABNORMAL LOW (ref 3.5–5.1)
Sodium: 138 mmol/L (ref 135–145)
TCO2: 27 mmol/L (ref 22–32)
pCO2 arterial: 47.4 mmHg (ref 32–48)
pH, Arterial: 7.339 — ABNORMAL LOW (ref 7.35–7.45)
pO2, Arterial: 353 mmHg — ABNORMAL HIGH (ref 83–108)

## 2023-08-31 LAB — RAPID URINE DRUG SCREEN, HOSP PERFORMED
Amphetamines: NOT DETECTED
Barbiturates: NOT DETECTED
Benzodiazepines: NOT DETECTED
Cocaine: NOT DETECTED
Opiates: NOT DETECTED
Tetrahydrocannabinol: NOT DETECTED

## 2023-08-31 LAB — DIFFERENTIAL
Abs Immature Granulocytes: 0.19 K/uL — ABNORMAL HIGH (ref 0.00–0.07)
Basophils Absolute: 0.1 K/uL (ref 0.0–0.1)
Basophils Relative: 1 %
Eosinophils Absolute: 0.3 K/uL (ref 0.0–0.5)
Eosinophils Relative: 1 %
Immature Granulocytes: 1 %
Lymphocytes Relative: 14 %
Lymphs Abs: 2.6 K/uL (ref 0.7–4.0)
Monocytes Absolute: 0.9 K/uL (ref 0.1–1.0)
Monocytes Relative: 5 %
Neutro Abs: 14.3 K/uL — ABNORMAL HIGH (ref 1.7–7.7)
Neutrophils Relative %: 78 %

## 2023-08-31 LAB — I-STAT CG4 LACTIC ACID, ED: Lactic Acid, Venous: 2.7 mmol/L (ref 0.5–1.9)

## 2023-08-31 LAB — COMPREHENSIVE METABOLIC PANEL WITH GFR
ALT: 21 U/L (ref 0–44)
AST: 29 U/L (ref 15–41)
Albumin: 3.6 g/dL (ref 3.5–5.0)
Alkaline Phosphatase: 56 U/L (ref 38–126)
Anion gap: 15 (ref 5–15)
BUN: 12 mg/dL (ref 6–20)
CO2: 21 mmol/L — ABNORMAL LOW (ref 22–32)
Calcium: 8.9 mg/dL (ref 8.9–10.3)
Chloride: 100 mmol/L (ref 98–111)
Creatinine, Ser: 1.21 mg/dL — ABNORMAL HIGH (ref 0.44–1.00)
GFR, Estimated: 59 mL/min — ABNORMAL LOW (ref >60)
Glucose, Bld: 285 mg/dL — ABNORMAL HIGH (ref 70–99)
Potassium: 3.1 mmol/L — ABNORMAL LOW (ref 3.5–5.1)
Sodium: 136 mmol/L (ref 135–145)
Total Bilirubin: 0.9 mg/dL (ref 0.0–1.2)
Total Protein: 7.7 g/dL (ref 6.5–8.1)

## 2023-08-31 LAB — I-STAT CHEM 8, ED
BUN: 14 mg/dL (ref 6–20)
Calcium, Ion: 1.01 mmol/L — ABNORMAL LOW (ref 1.15–1.40)
Chloride: 102 mmol/L (ref 98–111)
Creatinine, Ser: 1.1 mg/dL — ABNORMAL HIGH (ref 0.44–1.00)
Glucose, Bld: 300 mg/dL — ABNORMAL HIGH (ref 70–99)
HCT: 44 % (ref 36.0–46.0)
Hemoglobin: 15 g/dL (ref 12.0–15.0)
Potassium: 3.7 mmol/L (ref 3.5–5.1)
Sodium: 137 mmol/L (ref 135–145)
TCO2: 24 mmol/L (ref 22–32)

## 2023-08-31 LAB — CBC
HCT: 42.4 % (ref 36.0–46.0)
Hemoglobin: 13.5 g/dL (ref 12.0–15.0)
MCH: 25.4 pg — ABNORMAL LOW (ref 26.0–34.0)
MCHC: 31.8 g/dL (ref 30.0–36.0)
MCV: 79.7 fL — ABNORMAL LOW (ref 80.0–100.0)
Platelets: 375 K/uL (ref 150–400)
RBC: 5.32 MIL/uL — ABNORMAL HIGH (ref 3.87–5.11)
RDW: 14.6 % (ref 11.5–15.5)
WBC: 18.3 K/uL — ABNORMAL HIGH (ref 4.0–10.5)
nRBC: 0 % (ref 0.0–0.2)

## 2023-08-31 LAB — CBG MONITORING, ED: Glucose-Capillary: 227 mg/dL — ABNORMAL HIGH (ref 70–99)

## 2023-08-31 LAB — PROTIME-INR
INR: 1.3 — ABNORMAL HIGH (ref 0.8–1.2)
Prothrombin Time: 17 s — ABNORMAL HIGH (ref 11.4–15.2)

## 2023-08-31 LAB — I-STAT VENOUS BLOOD GAS, ED
Acid-Base Excess: 1 mmol/L (ref 0.0–2.0)
Bicarbonate: 26.1 mmol/L (ref 20.0–28.0)
Calcium, Ion: 1.02 mmol/L — ABNORMAL LOW (ref 1.15–1.40)
HCT: 42 % (ref 36.0–46.0)
Hemoglobin: 14.3 g/dL (ref 12.0–15.0)
O2 Saturation: 90 %
Potassium: 3.1 mmol/L — ABNORMAL LOW (ref 3.5–5.1)
Sodium: 138 mmol/L (ref 135–145)
TCO2: 27 mmol/L (ref 22–32)
pCO2, Ven: 41 mmHg — ABNORMAL LOW (ref 44–60)
pH, Ven: 7.412 (ref 7.25–7.43)
pO2, Ven: 58 mmHg — ABNORMAL HIGH (ref 32–45)

## 2023-08-31 LAB — APTT: aPTT: <22 s — ABNORMAL LOW (ref 24–36)

## 2023-08-31 LAB — AMMONIA: Ammonia: 36 umol/L — ABNORMAL HIGH (ref 9–35)

## 2023-08-31 LAB — MRSA NEXT GEN BY PCR, NASAL: MRSA by PCR Next Gen: NOT DETECTED

## 2023-08-31 LAB — ETHANOL: Alcohol, Ethyl (B): <15 mg/dL (ref <15)

## 2023-08-31 LAB — HCG, SERUM, QUALITATIVE: Preg, Serum: NEGATIVE

## 2023-08-31 MED ORDER — ACETAMINOPHEN 325 MG PO TABS: 650.0000 mg | ORAL_TABLET | Freq: Four times a day (QID) | ORAL | Status: DC | PRN

## 2023-08-31 MED ORDER — IOHEXOL 350 MG/ML SOLN
100.0000 mL | Freq: Once | INTRAVENOUS | Status: AC | PRN
  Administered 2023-08-31: 100 mL via INTRAVENOUS

## 2023-08-31 MED ORDER — CHLORHEXIDINE GLUCONATE CLOTH 2 % EX PADS
6.0000 | MEDICATED_PAD | Freq: Every day | CUTANEOUS | Status: DC
  Administered 2023-08-31: 6 via TOPICAL

## 2023-08-31 MED ORDER — GLYCOPYRROLATE 0.2 MG/ML IJ SOLN: 0.2000 mg | INTRAMUSCULAR | Status: DC | PRN

## 2023-08-31 MED ORDER — ROCURONIUM BROMIDE 10 MG/ML (PF) SYRINGE
PREFILLED_SYRINGE | INTRAVENOUS | Status: DC | PRN
  Administered 2023-08-31: 100 mg via INTRAVENOUS
  Administered 2023-08-31 (×2): 50 mg via INTRAVENOUS

## 2023-08-31 MED ORDER — NALOXONE HCL 2 MG/2ML IJ SOSY
2.0000 mg | PREFILLED_SYRINGE | Freq: Once | INTRAMUSCULAR | Status: AC
Start: 2023-08-31 — End: 2023-08-31
  Administered 2023-08-31: 2 mg via INTRAVENOUS

## 2023-08-31 MED ORDER — SENNOSIDES-DOCUSATE SODIUM 8.6-50 MG PO TABS: 1.0000 | ORAL_TABLET | Freq: Two times a day (BID) | ORAL | Status: DC

## 2023-08-31 MED ORDER — STROKE: EARLY STAGES OF RECOVERY BOOK: Freq: Once | Status: DC

## 2023-08-31 MED ORDER — PANTOPRAZOLE SODIUM 40 MG IV SOLR: 40.0000 mg | Freq: Every day | INTRAVENOUS | Status: DC

## 2023-08-31 MED ORDER — MANNITOL 20 % IV SOLN
INTRAVENOUS | Status: AC
  Filled 2023-08-31: qty 500

## 2023-08-31 MED ORDER — POLYVINYL ALCOHOL 1.4 % OP SOLN: 1.0000 [drp] | Freq: Four times a day (QID) | OPHTHALMIC | Status: DC | PRN

## 2023-08-31 MED ORDER — MANNITOL 20 % IV SOLN
INTRAVENOUS | Status: AC
  Administered 2023-08-31: 150 g via INTRAVENOUS
  Filled 2023-08-31: qty 500

## 2023-08-31 MED ORDER — ACETAMINOPHEN 160 MG/5ML PO SOLN: 650.0000 mg | ORAL | Status: DC | PRN

## 2023-08-31 MED ORDER — ACETAMINOPHEN 650 MG RE SUPP: 650.0000 mg | Freq: Four times a day (QID) | RECTAL | Status: DC | PRN

## 2023-08-31 MED ORDER — ACETAMINOPHEN 650 MG RE SUPP: 650.0000 mg | RECTAL | Status: DC | PRN

## 2023-08-31 MED ORDER — MANNITOL 20 % IV SOLN: 150.0000 g | Freq: Once | INTRAVENOUS | Status: AC

## 2023-08-31 MED ORDER — LABETALOL HCL 5 MG/ML IV SOLN: 10.0000 mg | Freq: Once | INTRAVENOUS | Status: DC | PRN

## 2023-08-31 MED ORDER — CLEVIDIPINE BUTYRATE 0.5 MG/ML IV EMUL
0.0000 mg/h | INTRAVENOUS | Status: DC
  Administered 2023-08-31: 2 mg/h via INTRAVENOUS

## 2023-08-31 MED ORDER — NOREPINEPHRINE 4 MG/250ML-% IV SOLN
INTRAVENOUS | Status: AC
  Filled 2023-08-31: qty 250

## 2023-08-31 MED ORDER — ACETAMINOPHEN 325 MG PO TABS: 650.0000 mg | ORAL_TABLET | ORAL | Status: DC | PRN

## 2023-08-31 MED ORDER — LABETALOL HCL 5 MG/ML IV SOLN: 10.0000 mg | INTRAVENOUS | Status: DC | PRN

## 2023-08-31 MED ORDER — POLYETHYLENE GLYCOL 3350 17 G PO PACK: 17.0000 g | PACK | Freq: Every day | ORAL | Status: DC

## 2023-08-31 MED ORDER — SODIUM CHLORIDE 0.9 % IV SOLN: INTRAVENOUS | Status: DC

## 2023-08-31 MED ORDER — INSULIN ASPART 100 UNIT/ML IJ SOLN: 0.0000 [IU] | INTRAMUSCULAR | Status: DC

## 2023-08-31 MED ORDER — ETOMIDATE 2 MG/ML IV SOLN
INTRAVENOUS | Status: DC | PRN
  Administered 2023-08-31: 20 mg via INTRAVENOUS

## 2023-08-31 MED ORDER — POTASSIUM CHLORIDE 10 MEQ/100ML IV SOLN: 10.0000 meq | INTRAVENOUS | Status: DC

## 2023-08-31 MED ORDER — PROPOFOL 1000 MG/100ML IV EMUL: 0.0000 ug/kg/min | INTRAVENOUS | Status: DC

## 2023-08-31 MED ORDER — PROTHROMBIN COMPLEX CONC HUMAN 500 UNITS IV KIT
4939.0000 [IU] | PACK | Status: AC
  Administered 2023-08-31: 4939 [IU] via INTRAVENOUS
  Filled 2023-08-31: qty 4939

## 2023-08-31 MED ORDER — GLYCOPYRROLATE 1 MG PO TABS: 1.0000 mg | ORAL_TABLET | ORAL | Status: DC | PRN

## 2023-08-31 MED ORDER — GLYCOPYRROLATE 0.2 MG/ML IJ SOLN
0.2000 mg | INTRAMUSCULAR | Status: DC | PRN
  Filled 2023-08-31: qty 1

## 2023-08-31 MED ORDER — PROPOFOL 1000 MG/100ML IV EMUL
INTRAVENOUS | Status: AC
  Administered 2023-08-31: 40 ug/kg/min via INTRAVENOUS
  Filled 2023-08-31: qty 100

## 2023-08-31 NOTE — Progress Notes (Signed)
 Pt transported on ventilator from ED resusc room to CT then to 4N22 w/o complications. Ambu bag present during transport. Pt remained on 100% throughout transport. Report given to 4N therapists upon arrival to unit

## 2023-08-31 NOTE — Progress Notes (Signed)
 Notified Kirkpatrick MD regarding family wishes to keep patient on comfort care until extended family is able to visit patient. Patient mother verbalized to hold medication and scheduled scans but keeping breathing tube in place.

## 2023-08-31 NOTE — Consult Note (Signed)
 CC: unresponsive  HPI:     Patient is a 38 y.o. female with morbid obesity (BMI 49), history of PE on Xarelto  was last seen normal 4 PM yesterday.  This morning, she was found in her room unresponsive on the floor.  Paramedics was called, did not respond to Narcan .  In the emergency room, she was intubated for airway protection.  CT head showed a large hemorrhagic stroke with significant intraventricular hemorrhage arising from the deep nuclei.   Patient Active Problem List   Diagnosis Date Noted   ICH (intracerebral hemorrhage) (HCC) 08/31/2023   Hypertensive emergency 12/12/2022   Elevated troponin 12/12/2022   Uncontrolled type 2 diabetes mellitus with hyperglycemia, with long-term current use of insulin  (HCC) 12/12/2022   History of pulmonary embolism 12/12/2022   Chronic anticoagulation 12/12/2022   History of DVT (deep vein thrombosis) 12/12/2022   Hypertensive crisis 03/08/2021   Ovarian cyst 02/16/2020   Obstructive sleep apnea syndrome 01/02/2020   Non-alcoholic fatty liver disease 01/02/2020   Positive antinuclear antibody 01/02/2020   Essential hypertension 11/24/2019   Menorrhagia 11/24/2019   Acute deep vein thrombosis (DVT) of popliteal vein of left lower extremity (HCC)    Pulmonary emboli (HCC) 04/13/2018   Diabetes mellitus (HCC) 01/17/2016   Routine general medical examination at a health care facility 12/27/2015   Morbid obesity (HCC) 12/27/2015   Nausea, vomiting, and diarrhea 03/11/2012   Past Medical History:  Diagnosis Date   Diabetes mellitus without complication (HCC)    type 2   DVT (deep venous thrombosis) (HCC) 03/2018   Gallstones    Headache(784.0)    Hypertension    OSA (obstructive sleep apnea) 05/26/2020   Sleep apnea    does not need cpap    Past Surgical History:  Procedure Laterality Date   CHOLECYSTECTOMY N/A 03/12/2012   Procedure: LAPAROSCOPIC CHOLECYSTECTOMY;  Surgeon: Vicenta DELENA Poli, MD;  Location: [facility] OR;  Service: General;   Laterality: N/A;   HYSTEROSCOPY WITH D & C N/A 05/11/2020   Procedure: DILATATION AND CURETTAGE /HYSTEROSCOPY;  Surgeon: Bettina Muskrat, MD;  Location: [facility] OR;  Service: Gynecology;  Laterality: N/A;   LAPAROSCOPY N/A 05/11/2020   Procedure: LAPAROSCOPY OPERATIVE; PELVIC WASHINGS;  Surgeon: Bettina Muskrat, MD;  Location: [facility] OR;  Service: Gynecology;  Laterality: N/A;  OVARIAN CYSTECTOMY    (Not in a hospital admission)  No Known Allergies  Social History   Tobacco Use   Smoking status: Former    Current packs/day: 0.00    Types: Cigarettes    Quit date: 05/24/2011    Years since quitting: 12.2   Smokeless tobacco: Never  Substance Use Topics   Alcohol  use: Yes    Comment: Once a month on average    Family History  Problem Relation Age of Onset   Healthy Mother    Ulcerative colitis Father    Diabetes Maternal Grandmother    Breast cancer Maternal Grandmother    Hypertension Maternal Grandmother    Syncope episode Maternal Grandfather    Benign prostatic hyperplasia Maternal Grandfather    Brain cancer Paternal Grandmother    Lung cancer Paternal Grandfather    Hypertension Son 8   Anesthesia problems Neg Hx    Hypotension Neg Hx    Malignant hyperthermia Neg Hx    Pseudochol deficiency Neg Hx    Colon cancer Neg Hx    Esophageal cancer Neg Hx    Rectal cancer Neg Hx      Review of Systems Pertinent items are noted  in HPI.  Objective:   Patient Vitals for the past 8 hrs:  BP Temp Temp src Pulse Resp SpO2 Height Weight  08/31/23 1124 (!) 153/84 -- -- (!) 131 (!) 21 100 % -- --  08/31/23 1121 (!) 165/99 -- -- (!) 130 20 100 % -- --  08/31/23 1120 -- 100.3 F (37.9 C) Bladder -- -- -- -- --  08/31/23 1119 -- -- -- (!) 133 20 100 % -- --  08/31/23 1118 (!) 152/85 -- -- (!) 135 20 100 % -- --  08/31/23 1117 (!) 167/90 -- -- (!) 136 20 100 % -- --  08/31/23 1115 (!) 169/95 -- -- (!) 139 20 100 % -- --  08/31/23 1100 (!) 211/122 -- -- (!) 142 20 100 % -- (!) 156 kg  08/31/23  1049 -- -- -- -- -- 100 % -- --  08/31/23 1045 (!) 264/174 -- -- (!) 165 20 100 % -- --  08/31/23 1034 (!) 169/101 -- -- (!) 180 20 100 % 5' 10.98 (1.803 m) --  08/31/23 0945 (!) 187/174 -- -- -- (!) 30 -- -- --   No intake/output data recorded. Total I/O In: 0  Out: 350 [Urine:350]   Intubated Some blood around ET tube Garrett/AT Eyes closed.  Pupils 8 mm and nonreactive bilaterally Motor exam limited as patient was just intubated but no movement to stimulation  Data ReviewCBC:  Lab Results  Component Value Date   WBC 18.3 (H) 08/31/2023   RBC 5.32 (H) 08/31/2023   BMP:  Lab Results  Component Value Date   GLUCOSE 300 (H) 08/31/2023   CO2 21 (L) 08/31/2023   BUN 14 08/31/2023   BUN 9 06/18/2020   CREATININE 1.10 (H) 08/31/2023   CREATININE 0.99 04/22/2020   CALCIUM 8.9 08/31/2023   Radiology review:  CT head without contrast was personally reviewed.  This shows a large intraparenchymal hemorrhage and intraventricular hemorrhage likely arising from the left basal ganglia and insula.  There is no ventriculomegaly.  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  Her neurologic exam currently is poor, with bilateral nonreactive pupils.  Plan:  - Emergency room is administering Kcentra .  CT angiogram has been ordered.  Clearly her prognosis for good neurologic recovery is poor.  Nevertheless, I do think placing an external ventricular drain is reasonable as a therapeutic trial but if we do not see any significant improvement over the next few days, I would recommend palliative care.

## 2023-08-31 NOTE — Progress Notes (Signed)
 SLP Cancellation Note  Patient Details Name: FRANKI ALCAIDE MRN: [id number] DOB: 01/29/1985   Cancelled treatment:       Reason Eval/Treat Not Completed: Patient not medically ready (intubated). SLP will continue following.    Damien Blumenthal, M.A., CCC-SLP Speech Language Pathology, Acute Rehabilitation Services  Secure Chat preferred [phone]  08/31/2023, 12:40 PM

## 2023-08-31 NOTE — Progress Notes (Signed)
 SRN rounding in ED at 11:00 alerted to ICH in Rescuc room with hemodynamic instability.. Airway, central access in place. SRN assisted pt's RN in stabilizing VS, initiating propofol , K centra and cleviprex , facilitated transport to advanced imaging. Assisted team taking pt to CT and then to 4 [place]. Bedside handoff with Verneita PEAK.

## 2023-08-31 NOTE — Progress Notes (Signed)
 Discussed CODE STATUS with family DNR for now Waiting for family Once everybody is at the goodbyes, for he is amenable for one-way extubation  -- Eligio Lav, MD Neurologist [facility]   Total additional cc time after initial consult of 40 min

## 2023-08-31 NOTE — ED Notes (Signed)
 EVS called to clean the room.

## 2023-08-31 NOTE — ED Provider Notes (Signed)
 [place] [facility]ERGENC[facility] DEPARTMENT AT [facility] Provider Note  CSN: [id number] Arrival date & time: 08/31/23 9061  Chief Complaint(s) No chief complaint on file.  HPI Whitney [name] is a 38 y.o. female history of diabetes, prior DVT on Xarelto , prior pulmonary embolism, hypertension presenting to the emergency department with unresponsiveness.  Patient was last seen normal by family around 4 PM yesterday.  This morning, patient's mother heard moaning from the patient's room, went in and found her on the floor, patient seemed unresponsive to her but was trying to get off the floor.  They called the paramedics, paramedics found the patient had minimal spontaneous activity, nonresponsive.  They tried Narcan  without improvement.  Patient was brought to the ER.  History otherwise limited as patient nonresponsive   Past Medical History Past Medical History:  Diagnosis Date  . Diabetes mellitus without complication (HCC)    type 2  . DVT (deep venous thrombosis) (HCC) 03/2018  . Gallstones   . Headache(784.0)   . Hypertension   . OSA (obstructive sleep apnea) 05/26/2020  . Sleep apnea    does not need cpap   Patient Active Problem List   Diagnosis Date Noted  . ICH (intracerebral hemorrhage) (HCC) 08/31/2023  . Hypertensive emergency 12/12/2022  . Elevated troponin 12/12/2022  . Uncontrolled type 2 diabetes mellitus with hyperglycemia, with long-term current use of insulin  (HCC) 12/12/2022  . History of pulmonary embolism 12/12/2022  . Chronic anticoagulation 12/12/2022  . History of DVT (deep vein thrombosis) 12/12/2022  . Hypertensive crisis 03/08/2021  . Ovarian cyst 02/16/2020  . Obstructive sleep apnea syndrome 01/02/2020  . Non-alcoholic fatty liver disease 01/02/2020  . Positive antinuclear antibody 01/02/2020  . Essential hypertension 11/24/2019  . Menorrhagia 11/24/2019  . Acute deep vein thrombosis (DVT) of popliteal vein of left lower extremity (HCC)   . Pulmonary  emboli (HCC) 04/13/2018  . Diabetes mellitus (HCC) 01/17/2016  . Routine general medical examination at a health care facility 12/27/2015  . Morbid obesity (HCC) 12/27/2015  . Nausea, vomiting, and diarrhea 03/11/2012   Home Medication(s) Prior to Admission medications   Medication Sig Start Date End Date Taking? Authorizing Provider  amLODipine -olmesartan  (AZOR ) 10-40 MG tablet Take 1 tablet by mouth daily. 12/15/22   Singh, Prashant K, MD  Biotin 89999 MCG TBDP Take 1 tablet by mouth daily.    [provider]  carvedilol  (COREG ) 3.125 MG tablet Take 1 tablet (3.125 mg total) by mouth 2 (two) times daily. Patient taking differently: Take 3.125 mg by mouth daily. 12/14/22 12/14/23  Singh, Prashant K, MD  CHLOROPHYLL PO Take 1 Dose by mouth daily.    [provider]  dapagliflozin  propanediol (FARXIGA ) 10 MG TABS tablet Take 1 tablet (10 mg total) by mouth daily before breakfast. Patient taking differently: Take 10 mg by mouth daily. 05/04/22   Flint Sonny POUR, PA-C  glucose blood (ONETOUCH VERIO) test strip 1 each by Other route 2 (two) times daily. And lancets 2/day 05/04/22   Flint Sonny POUR, PA-C  insulin  glargine (LANTUS  SOLOSTAR) 100 UNIT/ML Solostar Pen Inject 15 Units into the skin daily. 05/04/22   Flint Sonny POUR, PA-C  Lancets [facility] DELICA PLUS [place]) MISC 1 each by Other route as directed. 05/04/22   Sofia, Leslie K, PA-C  metFORMIN  (GLUCOPHAGE -XR) 500 MG 24 hr tablet Take 2 tablets (1,000 mg total) by mouth daily with breakfast. 05/04/22   Sofia, Leslie K, PA-C  Multiple Vitamins-Minerals (HAIR/SKIN/NAILS) TABS Take 1 tablet by mouth daily with  breakfast.    [provider]  rivaroxaban  (XARELTO ) 10 MG TABS tablet Take 1 tablet (10 mg total) by mouth daily. 05/04/22   Flint Sonny MARLA DEVONNA                                                                                                                                    Past Surgical History Past  Surgical History:  Procedure Laterality Date  . CHOLECYSTECTOMY N/A 03/12/2012   Procedure: LAPAROSCOPIC CHOLECYSTECTOMY;  Surgeon: Vicenta DELENA Poli, MD;  Location: [facility] OR;  Service: General;  Laterality: N/A;  . HYSTEROSCOPY WITH D & C N/A 05/11/2020   Procedure: DILATATION AND CURETTAGE /HYSTEROSCOPY;  Surgeon: Bettina Muskrat, MD;  Location: [facility] OR;  Service: Gynecology;  Laterality: N/A;  . LAPAROSCOPY N/A 05/11/2020   Procedure: LAPAROSCOPY OPERATIVE; PELVIC WASHINGS;  Surgeon: Bettina Muskrat, MD;  Location: [facility] OR;  Service: Gynecology;  Laterality: N/A;  OVARIAN CYSTECTOMY   Family History Family History  Problem Relation Age of Onset  . Healthy Mother   . Ulcerative colitis Father   . Diabetes Maternal Grandmother   . Breast cancer Maternal Grandmother   . Hypertension Maternal Grandmother   . Syncope episode Maternal Grandfather   . Benign prostatic hyperplasia Maternal Grandfather   . Brain cancer Paternal Grandmother   . Lung cancer Paternal Grandfather   . Hypertension Son 8  . Anesthesia problems Neg Hx   . Hypotension Neg Hx   . Malignant hyperthermia Neg Hx   . Pseudochol deficiency Neg Hx   . Colon cancer Neg Hx   . Esophageal cancer Neg Hx   . Rectal cancer Neg Hx     Social History Social History   Tobacco Use  . Smoking status: Former    Current packs/day: 0.00    Types: Cigarettes    Quit date: 05/24/2011    Years since quitting: 12.2  . Smokeless tobacco: Never  Vaping Use  . Vaping status: Never Used  Substance Use Topics  . Alcohol  use: Yes    Comment: Once a month on average  . Drug use: No   Allergies Patient has no known allergies.  Review of Systems Review of Systems  Unable to perform ROS: Patient unresponsive    Physical Exam Vital Signs  I have reviewed the triage vital signs BP (!) 153/84   Pulse (!) 131   Temp 100.3 F (37.9 C) (Bladder)   Resp (!) 21   Ht 5' 10.98 (1.803 m)   Wt (!) 156 kg   LMP  (LMP Unknown)   SpO2 100%   BMI  47.99 kg/m  Physical Exam Vitals and nursing note reviewed.  Constitutional:      Comments: Obtunded, not responsive  HENT:     Head: Normocephalic and atraumatic.     Right Ear: External ear normal.     Left Ear: External ear normal.     Mouth/Throat:  Mouth: Mucous membranes are moist.  Eyes:     Comments: Pupils 2 to 3 mm, minimally reactive  Cardiovascular:     Rate and Rhythm: Regular rhythm. Tachycardia present.  Pulmonary:     Comments: Coarse bilateral breath sounds Abdominal:     General: There is no distension.     Tenderness: There is no abdominal tenderness.  Musculoskeletal:     Right lower leg: Edema present.     Left lower leg: Edema present.  Skin:    Capillary Refill: Capillary refill takes less than 2 seconds.  Neurological:     Mental Status: She is unresponsive.     GCS: GCS eye subscore is 1. GCS verbal subscore is 2. GCS motor subscore is 2.  Psychiatric:     Comments: Unable to assess      ED Results and Treatments Labs (all labs ordered are listed, but only abnormal results are displayed) Labs Reviewed  PROTIME-INR - Abnormal; Notable for the following components:      Result Value   Prothrombin  Time 17.0 (*)    INR 1.3 (*)    All other components within normal limits  CBC - Abnormal; Notable for the following components:   WBC 18.3 (*)    RBC 5.32 (*)    MCV 79.7 (*)    MCH 25.4 (*)    All other components within normal limits  DIFFERENTIAL - Abnormal; Notable for the following components:   Neutro Abs 14.3 (*)    Abs Immature Granulocytes 0.19 (*)    All other components within normal limits  COMPREHENSIVE METABOLIC PANEL WITH GFR - Abnormal; Notable for the following components:   Potassium 3.1 (*)    CO2 21 (*)    Glucose, Bld 285 (*)    Creatinine, Ser 1.21 (*)    GFR, Estimated 59 (*)    All other components within normal limits  AMMONIA - Abnormal; Notable for the following components:   Ammonia 36 (*)    All other  components within normal limits  CBG MONITORING, ED - Abnormal; Notable for the following components:   Glucose-Capillary 227 (*)    All other components within normal limits  I-STAT CHEM 8, ED - Abnormal; Notable for the following components:   Creatinine, Ser 1.10 (*)    Glucose, Bld 300 (*)    Calcium, Ion 1.01 (*)    All other components within normal limits  I-STAT VENOUS BLOOD GAS, ED - Abnormal; Notable for the following components:   pCO2, Ven 41.0 (*)    pO2, Ven 58 (*)    Potassium 3.1 (*)    Calcium, Ion 1.02 (*)    All other components within normal limits  I-STAT CG4 LACTIC ACID, ED - Abnormal; Notable for the following components:   Lactic Acid, Venous 2.7 (*)    All other components within normal limits  ETHANOL  HCG, SERUM, QUALITATIVE  APTT  RAPID URINE DRUG SCREEN, HOSP PERFORMED  CBG MONITORING, ED  I-STAT CHEM 8, ED  I-STAT CG4 LACTIC ACID, ED  Radiology CT CERVICAL SPINE WO CONTRAST Result Date: 08/31/2023 EXAM: CT HEAD AND CERVICAL SPINE 08/31/2023 10:10:11 AM TECHNIQUE: CT of the head and cervical spine was performed without the administration of intravenous contrast. Multiplanar reformatted images are provided for review. Automated exposure control, iterative reconstruction, and/or weight based adjustment of the mA/kV was utilized to reduce the radiation dose to as low as reasonably achievable. COMPARISON: 03/08/2021 CLINICAL HISTORY: Neuro deficit, acute, stroke suspected. No contrast. Unresponsive. FINDINGS: CT HEAD BRAIN AND VENTRICLES: Acute intraparenchymal hematoma centered within the left subinsular region, measuring up to 51 x 27 x 16 mm (11 mL) with intraventricular extension and casting of the ventricles. Dilation of the entire ventricular system with periventricular hypoattenuation, consistent with acute hydrocephalus. Sulcal effacement  and effacement of the basilar cisterns with likely early tonsillar herniation. ORBITS: No acute abnormality. SINUSES AND MASTOIDS: No acute abnormality. SOFT TISSUES AND SKULL: No acute skull fracture. No acute soft tissue abnormality. CT CERVICAL SPINE BONES AND ALIGNMENT: No acute fracture or traumatic malalignment. DEGENERATIVE CHANGES: No significant degenerative changes. SOFT TISSUES: No prevertebral soft tissue swelling. Moderate pulmonary edema in the lung apices. IMPRESSION: 1. Acute intraparenchymal hematoma centered within the left subinsular region, measuring up to 51 x 27 x 16 mm (11 mL), with intraventricular extension and casting of the ventricles. 2. Dilation of the entire ventricular system with periventricular hypoattenuation, consistent with acute hydrocephalus. 3. Sulcal effacement and effacement of the basilar cisterns with likely early tonsillar herniation. 4. Moderate pulmonary edema in the lung apices. Critical findings were communicated by phone at the time of interpretation to [place] RIGGERS, for Dr. Francesca. Electronically signed by: Ryan Chess MD 08/31/2023 10:33 AM EDT RP Workstation: [id number]   CT HEAD WO CONTRAST ( ) Result Date: 08/31/2023 EXAM: CT HEAD AND CERVICAL SPINE 08/31/2023 10:10:11 AM TECHNIQUE: CT of the head and cervical spine was performed without the administration of intravenous contrast. Multiplanar reformatted images are provided for review. Automated exposure control, iterative reconstruction, and/or weight based adjustment of the mA/kV was utilized to reduce the radiation dose to as low as reasonably achievable. COMPARISON: 03/08/2021 CLINICAL HISTORY: Neuro deficit, acute, stroke suspected. No contrast. Unresponsive. FINDINGS: CT HEAD BRAIN AND VENTRICLES: Acute intraparenchymal hematoma centered within the left subinsular region, measuring up to 51 x 27 x 16 mm (11 mL) with intraventricular extension and casting of the ventricles. Dilation of the entire  ventricular system with periventricular hypoattenuation, consistent with acute hydrocephalus. Sulcal effacement and effacement of the basilar cisterns with likely early tonsillar herniation. ORBITS: No acute abnormality. SINUSES AND MASTOIDS: No acute abnormality. SOFT TISSUES AND SKULL: No acute skull fracture. No acute soft tissue abnormality. CT CERVICAL SPINE BONES AND ALIGNMENT: No acute fracture or traumatic malalignment. DEGENERATIVE CHANGES: No significant degenerative changes. SOFT TISSUES: No prevertebral soft tissue swelling. Moderate pulmonary edema in the lung apices. IMPRESSION: 1. Acute intraparenchymal hematoma centered within the left subinsular region, measuring up to 51 x 27 x 16 mm (11 mL), with intraventricular extension and casting of the ventricles. 2. Dilation of the entire ventricular system with periventricular hypoattenuation, consistent with acute hydrocephalus. 3. Sulcal effacement and effacement of the basilar cisterns with likely early tonsillar herniation. 4. Moderate pulmonary edema in the lung apices. Critical findings were communicated by phone at the time of interpretation to Wadie RIGGERS, for Dr. Francesca. Electronically signed by: Ryan Chess MD 08/31/2023 10:33 AM EDT RP Workstation: [id number]    Pertinent labs & imaging results that were available during my care of the patient were reviewed  by me and considered in my medical decision making (see MDM for details).  Medications Ordered in ED Medications  etomidate  (AMIDATE ) injection (20 mg Intravenous Given 08/31/23 1020)  rocuronium  (ZEMURON ) injection (50 mg Intravenous Given 08/31/23 1023)  clevidipine  (CLEVIPREX ) infusion 0.5 mg/mL (2 mg/hr Intravenous New Bag/Given 08/31/23 1101)  polyethylene glycol (MIRALAX  / GLYCOLAX ) packet 17 g (has no administration in time range)  propofol  (DIPRIVAN ) 1000 MG/100ML infusion (40 mcg/kg/min  156 kg Intravenous New Bag/Given 08/31/23 1101)   stroke: early stages of recovery book  (has no administration in time range)  acetaminophen  (TYLENOL ) tablet 650 mg (has no administration in time range)    Or  acetaminophen  (TYLENOL ) 160 MG/5ML solution 650 mg (has no administration in time range)    Or  acetaminophen  (TYLENOL ) suppository 650 mg (has no administration in time range)  senna-docusate (Senokot-S) tablet 1 tablet (has no administration in time range)  pantoprazole  (PROTONIX ) injection 40 mg (has no administration in time range)  labetalol  (NORMODYNE ) injection 10 mg (has no administration in time range)  naloxone  (NARCAN ) injection 2 mg (2 mg Intravenous Given 08/31/23 1000)  prothrombin  complex conc human (KCENTRA ) IVPB 4,939 Units (4,939 Units Intravenous New Bag/Given 08/31/23 1107)                                                                                                                                     Procedures .Critical Care  Performed by: Francesca Elsie CROME, MD Authorized by: Francesca Elsie CROME, MD   Critical care provider statement:    Critical care time (minutes):  30   Critical care start time:  08/31/2023 9:45 AM   Critical care was time spent personally by me on the following activities:  Development of treatment plan with patient or surrogate, discussions with consultants, evaluation of patient's response to treatment, examination of patient, ordering and review of laboratory studies, ordering and review of radiographic studies, ordering and performing treatments and interventions, pulse oximetry, re-evaluation of patient's condition and review of old charts Procedure Name: Intubation Date/Time: 08/31/2023 11:20 AM  Performed by: Francesca Elsie CROME, MDPre-anesthesia Checklist: Patient identified, Patient being monitored, Emergency Drugs available, Timeout performed and Suction available Oxygen Delivery Method: Non-rebreather mask Preoxygenation: Pre-oxygenation with 100% oxygen Induction Type: Rapid sequence Ventilation: Mask ventilation  without difficulty Laryngoscope Size: Mac and 4 Grade View: Grade I Tube size: 7.5 mm Number of attempts: 2 Airway Equipment and Method: Video-laryngoscopy Placement Confirmation: ETT inserted through vocal cords under direct vision, CO2 detector and Breath sounds checked- equal and bilateral    Central Line  Date/Time: 08/31/2023 11:21 AM  Performed by: Francesca Elsie CROME, MD Authorized by: Francesca Elsie CROME, MD   Consent:    Consent obtained:  Emergent situation Universal protocol:    Patient identity confirmed:  Verbally with patient and arm band Pre-procedure details:    Indication(s): central venous access and insufficient peripheral access  Hand hygiene: Hand hygiene performed prior to insertion     Sterile barrier technique: All elements of maximal sterile technique followed     Skin preparation:  Chlorhexidine  with alcohol    Skin preparation agent: Skin preparation agent completely dried prior to procedure   Sedation:    Sedation type:  None Anesthesia:    Anesthesia method:  None Procedure details:    Location:  R femoral   Patient position:  Supine   Procedural supplies:  Triple lumen   Ultrasound guidance: yes     Ultrasound guidance timing: real time     Sterile ultrasound techniques: Sterile gel and sterile probe covers were used     Number of attempts:  1   Successful placement: yes   Post-procedure details:    Post-procedure:  Dressing applied   Assessment:  Blood return through all ports and free fluid flow   Procedure completion:  Tolerated well, no immediate complications   (including critical care time)  Medical Decision Making / ED Course   MDM:  38 year old presenting to the emergency department with unresponsiveness.  Patient ill-appearing and not responsive.  Additional Narcan  was tried without improvement.  Patient was taken immediately to CT scanner which showed large intracranial bleed.  Kcentra  and Cleviprex  initiated patient was  intubated due to poor mental status.  Discussed with neurosurgery Dr. Debby, recommends CTA, probably will plan for EVD.  Discussed with Dr. Deedra with neurology.  Discussed with patient's family.  Will proceed with CTA.  Blood pressure is improving on Cleviprex .  Heart rate also elevated.  Sinus rhythm.  CT cervical spine was also obtained due to possible fall and negative.  No other signs of trauma on exam.   Clinical Course as of 08/31/23 1134  Fri Aug 31, 2023  1130 Patient admitted to neurology service for further monitoring/ICU care.  [WS]  1134 EKG 12-Lead [WS]    Clinical Course User Index [WS] Francesca Elsie CROME, MD     Additional history obtained: -Additional history obtained from family and ems -External records from outside source obtained and reviewed including: Chart review including previous notes, labs, imaging, consultation notes including prior notes    Lab Tests: -I ordered, reviewed, and interpreted labs.   The pertinent results include:   Labs Reviewed  PROTIME-INR - Abnormal; Notable for the following components:      Result Value   Prothrombin  Time 17.0 (*)    INR 1.3 (*)    All other components within normal limits  CBC - Abnormal; Notable for the following components:   WBC 18.3 (*)    RBC 5.32 (*)    MCV 79.7 (*)    MCH 25.4 (*)    All other components within normal limits  DIFFERENTIAL - Abnormal; Notable for the following components:   Neutro Abs 14.3 (*)    Abs Immature Granulocytes 0.19 (*)    All other components within normal limits  COMPREHENSIVE METABOLIC PANEL WITH GFR - Abnormal; Notable for the following components:   Potassium 3.1 (*)    CO2 21 (*)    Glucose, Bld 285 (*)    Creatinine, Ser 1.21 (*)    GFR, Estimated 59 (*)    All other components within normal limits  AMMONIA - Abnormal; Notable for the following components:   Ammonia 36 (*)    All other components within normal limits  CBG MONITORING, ED - Abnormal; Notable for  the following components:   Glucose-Capillary 227 (*)    All  other components within normal limits  I-STAT CHEM 8, ED - Abnormal; Notable for the following components:   Creatinine, Ser 1.10 (*)    Glucose, Bld 300 (*)    Calcium, Ion 1.01 (*)    All other components within normal limits  I-STAT VENOUS BLOOD GAS, ED - Abnormal; Notable for the following components:   pCO2, Ven 41.0 (*)    pO2, Ven 58 (*)    Potassium 3.1 (*)    Calcium, Ion 1.02 (*)    All other components within normal limits  I-STAT CG4 LACTIC ACID, ED - Abnormal; Notable for the following components:   Lactic Acid, Venous 2.7 (*)    All other components within normal limits  ETHANOL  HCG, SERUM, QUALITATIVE  APTT  RAPID URINE DRUG SCREEN, HOSP PERFORMED  CBG MONITORING, ED  I-STAT CHEM 8, ED  I-STAT CG4 LACTIC ACID, ED    Notable for leukocytosis, lactic acidosis, mild AKI   EKG   EKG Interpretation Date/Time:  Friday August 31 2023 09:47:30 EDT Ventricular Rate:  105 PR Interval:  195 QRS Duration:  102 QT Interval:  468 QTC Calculation: 628 R Axis:   124  Text Interpretation: Sinus tachycardia Biatrial enlargement Lateral infarct, old Prolonged QT interval Confirmed by Francesca Fallow ([id number]) on 08/31/2023 11:17:23 AM         Imaging Studies ordered: I ordered imaging studies including CXR, CT head  On my interpretation imaging demonstrates ICH, et tube  I independently visualized and interpreted imaging. I agree with the radiologist interpretation   Medicines ordered and prescription drug management: Meds ordered this encounter  Medications  . naloxone  (NARCAN ) injection 2 mg  . prothrombin  complex conc human (KCENTRA ) IVPB 4,939 Units    Administer at a rate of 3 units/kg/minute up to a maximum rate of 8.4 mL/min (~210 units/minute). Consider changing administration time to weight-based on a case-by-case basis (ie life-threatening bleed or lack of IV access sites).  . etomidate   (AMIDATE ) injection  . rocuronium  (ZEMURON ) injection  . clevidipine  (CLEVIPREX ) infusion 0.5 mg/mL  . propofol  (DIPRIVAN ) 1000 MG/100ML infusion    Merilee Linsey I: cabinet override  . polyethylene glycol (MIRALAX  / GLYCOLAX ) packet 17 g  . propofol  (DIPRIVAN ) 1000 MG/100ML infusion  . DISCONTD: labetalol  (NORMODYNE ) injection 10-20 mg  .  stroke: early stages of recovery book  . OR Linked Order Group   . acetaminophen  (TYLENOL ) tablet 650 mg   . acetaminophen  (TYLENOL ) 160 MG/5ML solution 650 mg   . acetaminophen  (TYLENOL ) suppository 650 mg  . senna-docusate (Senokot-S) tablet 1 tablet  . pantoprazole  (PROTONIX ) injection 40 mg  . labetalol  (NORMODYNE ) injection 10 mg    -I have reviewed the patients home medicines and have made adjustments as needed   Consultations Obtained: I requested consultation with the neurologist, neurosurgeon,  and discussed lab and imaging findings as well as pertinent plan - they recommend: admission   Cardiac Monitoring: The patient was maintained on a cardiac monitor.  I personally viewed and interpreted the cardiac monitored which showed an underlying rhythm of: NSR  Social Determinants of Health:  Diagnosis or treatment significantly limited by social determinants of health: obesity   Reevaluation: After the interventions noted above, I reevaluated the patient and found that their symptoms have stayed the same  Co morbidities that complicate the patient evaluation . Past Medical History:  Diagnosis Date  . Diabetes mellitus without complication (HCC)    type 2  . DVT (deep venous thrombosis) (HCC) 03/2018  .  Gallstones   . Headache(784.0)   . Hypertension   . OSA (obstructive sleep apnea) 05/26/2020  . Sleep apnea    does not need cpap      Dispostion: Disposition decision including need for hospitalization was considered, and patient admitted to the hospital.    Final Clinical Impression(s) / ED Diagnoses Final diagnoses:   Brain bleed [facility])     This chart was dictated using voice recognition software.  Despite best efforts to proofread,  errors can occur which can change the documentation meaning.    Francesca Elsie CROME, MD 08/31/23 [phone]

## 2023-08-31 NOTE — ED Triage Notes (Signed)
 BIBEMS foujnd unresponsive LKW 1600 yesterday, vomiting, cheyne stoke breathing.    252 cbg  180-190 SBP  2 narcan  no response

## 2023-08-31 NOTE — H&P (Addendum)
 NEUROLOGY H&P NOTE   Date of service: August 31, 2023 Patient Name: Whitney [name] MRN:  [id number] DOB:  [birth year] Chief Complaint: Unresponsiveness  History of Present Illness  KAMELA BLANSETT is a 38 y.o. female with a PMH diabetes, prior DVT on Xarelto , prior pulmonary embolism, hypertension presenting to the emergency department after being found unresponsive. Patient was last seen normal by family around 4 PM yesterday and then around midnight they know she got up to move laundry to the dryer. Around 0830 the patient's mother heard moaning from the patient's room, went in and found her on the floor unresponsive.  Brought to the emergency department where her head CT showed a large ICH.  She was emergently intubated and Xarelto  was reversed with Kcentra  per ED provider.  Neurology and neurosurgery were consulted.  Last known well: 4 PM on 08/30/2023 Modified rankin score: 0-Completely asymptomatic and back to baseline post- stroke ICH Score: 4-97% mortality 30 days according to the documented study in the literature tNKASE: Not offered due to ICH Thrombectomy: not offered due to ICH NIHSS components Score: Comment  1a Level of Conscious 0[]  1[]  2[]  3[x]      1b LOC Questions 0[]  1[]  2[x]       1c LOC Commands 0[]  1[]  2[x]       2 Best Gaze 0[x]  1[]  2[]       3 Visual 0[x]  1[]  2[]  3[]      4 Facial Palsy 0[x]  1[]  2[]  3[]      5a Motor Arm - left 0[]  1[]  2[]  3[]  4[x]  UN[]    5b Motor Arm - Right 0[]  1[]  2[]  3[]  4[x]  UN[]    6a Motor Leg - Left 0[]  1[]  2[]  3[]  4[x]  UN[]    6b Motor Leg - Right 0[]  1[]  2[]  3[]  4[x]  UN[]    7 Limb Ataxia 0[x]  1[]  2[]  UN[]      8 Sensory 0[x]  1[]  2[]  UN[]      9 Best Language 0[]  1[]  2[]  3[x]      10 Dysarthria 0[]  1[]  2[x]  UN[]      11 Extinct. and Inattention 0[x]  1[]  2[]       TOTAL: 28      ROS   Unable to perform due to unresponsive   Past History   Past Medical History:  Diagnosis Date   Diabetes mellitus without complication (HCC)    type 2   DVT (deep  venous thrombosis) (HCC) 03/2018   Gallstones    Headache(784.0)    Hypertension    OSA (obstructive sleep apnea) 05/26/2020   Sleep apnea    does not need cpap   Past Surgical History:  Procedure Laterality Date   CHOLECYSTECTOMY N/A 03/12/2012   Procedure: LAPAROSCOPIC CHOLECYSTECTOMY;  Surgeon: Vicenta DELENA Poli, MD;  Location: [facility] OR;  Service: General;  Laterality: N/A;   HYSTEROSCOPY WITH D & C N/A 05/11/2020   Procedure: DILATATION AND CURETTAGE /HYSTEROSCOPY;  Surgeon: Bettina Muskrat, MD;  Location: [facility] OR;  Service: Gynecology;  Laterality: N/A;   LAPAROSCOPY N/A 05/11/2020   Procedure: LAPAROSCOPY OPERATIVE; PELVIC WASHINGS;  Surgeon: Bettina Muskrat, MD;  Location: [facility] OR;  Service: Gynecology;  Laterality: N/A;  OVARIAN CYSTECTOMY   Family History  Problem Relation Age of Onset   Healthy Mother    Ulcerative colitis Father    Diabetes Maternal Grandmother    Breast cancer Maternal Grandmother    Hypertension Maternal Grandmother    Syncope episode Maternal Grandfather    Benign prostatic hyperplasia Maternal Grandfather    Brain cancer Paternal Grandmother  Lung cancer Paternal Grandfather    Hypertension Son 8   Anesthesia problems Neg Hx    Hypotension Neg Hx    Malignant hyperthermia Neg Hx    Pseudochol deficiency Neg Hx    Colon cancer Neg Hx    Esophageal cancer Neg Hx    Rectal cancer Neg Hx    Social History   Socioeconomic History   Marital status: Single    Spouse name: Not on file   Number of children: 1   Years of education: 13   Highest education level: Not on file  Occupational History   Not on file  Tobacco Use   Smoking status: Former    Current packs/day: 0.00    Types: Cigarettes    Quit date: 05/24/2011    Years since quitting: 12.2   Smokeless tobacco: Never  Vaping Use   Vaping status: Never Used  Substance and Sexual Activity   Alcohol  use: Yes    Comment: Once a month on average   Drug use: No   Sexual activity: Not Currently    Birth  control/protection: None  Other Topics Concern   Not on file  Social History Narrative   Fun: Read, hang out with her son.    Denies abuse and feels safe at home.    Social Drivers of Health   Financial Resource Strain: Medium Risk (10/05/2020)   Overall Financial Resource Strain (CARDIA)    Difficulty of Paying Living Expenses: Somewhat hard  Food Insecurity: No Food Insecurity (12/12/2022)   Hunger Vital Sign    Worried About Running Out of Food in the Last Year: Never true    Ran Out of Food in the Last Year: Never true  Transportation Needs: No Transportation Needs (12/12/2022)   PRAPARE - Administrator, Civil Service (Medical): No    Lack of Transportation (Non-Medical): No  Physical Activity: Inactive (05/26/2020)   Exercise Vital Sign    Days of Exercise per Week: 0 days    Minutes of Exercise per Session: 0 min  Stress: Stress Concern Present (05/26/2020)   [employer] of Occupational Health - Occupational Stress Questionnaire    Feeling of Stress : To some extent  Social Connections: Not on file   No Known Allergies  Medications  (Not in a hospital admission)    Vitals   Vitals:   08/31/23 1119 08/31/23 1120 08/31/23 1121 08/31/23 1124  BP:   (!) 165/99 (!) 153/84  Pulse: (!) 133  (!) 130 (!) 131  Resp: 20  20 (!) 21  Temp:  100.3 F (37.9 C)    TempSrc:  Bladder    SpO2: 100%  100% 100%  Weight:      Height:         Body mass index is 47.99 kg/m.  Physical Exam   General: Sedated intubated HEENT: Normocephalic atraumatic Lungs: Vented Neurological exam Sedated intubated Given paralytic for intubation-Limited exam Pupils are 7 mm, nonreactive Breathing with the ventilator No response to noxious stimulation Unclear of the pupil size and responsiveness on presentation but it seemed like she was moving some when she got picked up by EMS.  Labs   CBC:  Recent Labs  Lab 08/31/23 0957 08/31/23 1004 08/31/23 1009  WBC 18.3*  --    --   NEUTROABS 14.3*  --   --   HGB 13.5 14.3 15.0  HCT 42.4 42.0 44.0  MCV 79.7*  --   --   PLT 375  --   --  Basic Metabolic Panel:  Lab Results  Component Value Date   NA 137 08/31/2023   K 3.7 08/31/2023   CO2 21 (L) 08/31/2023   GLUCOSE 300 (H) 08/31/2023   BUN 14 08/31/2023   CREATININE 1.10 (H) 08/31/2023   CALCIUM 8.9 08/31/2023   GFRNONAA 59 (L) 08/31/2023   GFRAA 120 02/03/2019   Lipid Panel:  Lab Results  Component Value Date   LDLCALC 73 05/22/2018   HgbA1c:  Lab Results  Component Value Date   HGBA1C 8.5 (H) 12/12/2022   Urine Drug Screen: No results found for: LABOPIA, COCAINSCRNUR, LABBENZ, AMPHETMU, THCU, LABBARB  Alcohol  Level     Component Value Date/Time   [facility] <15 08/31/2023 0957   INR  Lab Results  Component Value Date   INR 1.3 (H) 08/31/2023   APTT  Lab Results  Component Value Date   APTT 25 04/13/2018     CT Head without contrast(Personally reviewed): 1. Acute intraparenchymal hematoma centered within the left subinsular region, measuring up to 51 x 27 x 16 mm, with intraventricular extension and casting of the ventricles. 2. Dilation of the entire ventricular system with periventricular hypoattenuation, consistent with acute hydrocephalus. 3. Sulcal effacement and effacement of the basilar cisterns with likely early tonsillar herniation. 4. Moderate pulmonary edema in the lung apices.  CT angio Head and Neck with contrast(Personally reviewed): Ordered and pending  Impression   SANYIA DINI is a 38 y.o. female prior history of DVT, Xarelto , hypertension, morbid obesity presenting to the emergency department after being found down unresponsive.  CT head with a large intraparenchymal hemorrhage within the left subinsular region measuring 51 x 27 x 16 mm with IVH and casting of the ventricles and dilatation of the entire ventricular system consistent with acute hydrocephalus.  There is sulcal effacement and effacement  of the basal cisterns with early tonsillar herniation. Her exam was very poor for me but it was done right after she was emergently intubated with the use of paralytics.  Her pupillary exam although looked very concerning for brain herniation Blood pressures were high on arrival-likely hypertensive bleed, in the setting of coagulopathy with Xarelto   Impression Large intracerebral hemorrhage-nontraumatic-likely hypertensive and coagulopathy etiology Cerebral edema-brain compression Coagulopathy from Xarelto  Hypertensive emergency Essential hypertension Diabetes   Recommendations  Admit to neurological ICU Stat neurology consult placed-Dr. Debby aware and discussing ventriculostomy with the family with the understanding that it may not change the eventual outcome which might be extremely poor from even a survival standpoint.  Ventriculostomy may help treat the cerebral edema. Mannitol  1 g/kg x 1 Coagulopathy reversed with Kcentra  per EDP upon arrival Strict blood pressure control-systolic goal 130-150.  When I saw the patient, she was on Cleviprex  at the rate of 8 mg/h, I increased it to 16 with blood pressure trending towards goal.  Orders in the chart for further titration to get to goal ASAP Stat CT angiography head and neck Frequent neurochecks Telemetry Obtain stroke risk factor workup for workup completion Replete electrolytes as necessary Check a.m. labs    ______________________________________________________________________   Signed,  Jorene Last, NP [facility]  Attending Neurohospitalist Addendum Patient seen and examined with APP/Resident. Agree with the history and physical as documented above. Agree with the plan as documented, which I helped formulate. I have independently reviewed the chart, obtained history, review of systems and examined the patient.I have personally reviewed pertinent head/neck/spine imaging (CT/MRI).  I have made the edits  above.  I have documented the plan above.  I have documented the examination above.  I have verified the history with the family above. I had a detailed discussion with family members-mother, father, brother and stepfather who are in the consult room.  I did explain to them that her condition remains extremely serious and this is a large ICH and IVH along with brain herniation and cerebral edema as well as the fact that she is coagulopathic from Xarelto  makes her very high risk for decompensation and poor outcome, not just from neurological recovery but from a survival perspective.  I would not be surprised if she progresses to brain death rather quickly because of the size of the bleed and coagulopathy and also the fact that her pupils were nonreactive-with the caveat that she was just given paralytics for intubation. They obviously were shocked to find all this in her processing information.  After my discussion with them in the ED, I walked them to the neurological ICU waiting room where they will meet with the neurosurgeon for consideration of ventriculostomy.  Please feel free to call with any questions.  -- Eligio Lav, MD Neurologist [facility] Pager: [phone]  CRITICAL CARE ATTESTATION Performed by: Eligio Lav, MD Total critical care time: 60 minutes Critical care time was exclusive of separately billable procedures and treating other patients and/or supervising APPs/Residents/Students Critical care was necessary to treat or prevent imminent or life-threatening deterioration. This patient is critically ill and at significant risk for neurological worsening and/or death and care requires constant monitoring. Critical care was time spent personally by me on the following activities: development of treatment plan with patient and/or surrogate as well as nursing, discussions with consultants, evaluation of patient's response to treatment, examination of patient, obtaining  history from patient or surrogate, ordering and performing treatments and interventions, ordering and review of laboratory studies, ordering and review of radiographic studies, pulse oximetry, re-evaluation of patient's condition, participation in multidisciplinary rounds and medical decision making of high complexity in the care of this patient.   Present on admission: Intracerebral hemorrhage, intraventricular hemorrhage, cerebral edema, brain compression, hypertensive emergency, coagulopathy due to anticoagulation

## 2023-08-31 NOTE — Progress Notes (Signed)
 I reviewed the CT angiogram of the head.  I do not see any intracranial blood flow.  I reexamined the patient.  The pupils remain 7 mm and nonreactive.  There is no corneal reflex.  No gag reflex.  No doll's eye reflex.  No movement to external stimulation.  I had a long discussion with the patient's family.  I had initially discussed with them a that therapeutic trial of external ventricular drain placement would provide possible but unlikely benefit, but after reviewing the CT angiogram, I explained that placement of an external ventricular drain would provide no benefit, especially as her ventricles are small.  I discussed with them that her exam currently is consistent with brain death but brain death would need to be established over time rather than with a snapshot neurologic exam.  I have recommended palliative care and they are considering withdrawing care once family has a chance to see her.  All questions and concerns were answered.  They appeared appreciative and understanding of my discussions with them.

## 2023-08-31 NOTE — Progress Notes (Signed)
 Pt transported to CT and back from resusc room on NRB mask. Pt tolerated well w/ sats maintaining >96% throughout.

## 2023-08-31 NOTE — Consult Note (Signed)
 NAME:  Whitney [name], MRN:  [id number], DOB:  March 06, 1985, LOS: 0 ADMISSION DATE:  08/31/2023, CONSULTATION DATE:  08/31/2023 REFERRING MD:  Dr Voncile, CHIEF COMPLAINT:  VDRF due to ICH   History of Present Illness:  38 year old woman with obesity, OSA not on CPAP, diabetes, DVT on Xarelto , hypertension brought to the emergency department 8/80 after being found unresponsive.  Last seen normal 1600 on 8/7.  In the ED required intubation for airway protection.  CT scan revealed large hemorrhagic stroke with significant IVH, associated intraventricular hemorrhage.  Kcentra  was administered in the ED.  She moved to the ICU for further care. NSGY has evaluated patient and EVD is being considered   Pertinent  Medical History   Past Medical History:  Diagnosis Date   Diabetes mellitus without complication (HCC)    type 2   DVT (deep venous thrombosis) (HCC) 03/2018   Gallstones    Headache(784.0)    Hypertension    OSA (obstructive sleep apnea) 05/26/2020   Sleep apnea    does not need cpap     Significant Hospital Events: Including procedures, antibiotic start and stop dates in addition to other pertinent events     Interim History / Subjective:    Objective    Blood pressure 108/73, pulse (!) 109, temperature 100.3 F (37.9 C), temperature source Bladder, resp. rate (!) 21, height 5' 10.98 (1.803 m), weight (!) 156 kg, SpO2 100%.    Vent Mode: PRVC FiO2 (%):  [100 %] 100 % Set Rate:  [20 bmp] 20 bmp Vt Set:  [540 mL-600 mL] 560 mL PEEP:  [8 cmH20] 8 cmH20 Plateau Pressure:  [27 cmH20] 27 cmH20   Intake/Output Summary (Last 24 hours) at 08/31/2023 1228 Last data filed at 08/31/2023 1131 Gross per 24 hour  Intake 0 ml  Output 350 ml  Net -350 ml   Filed Weights   08/31/23 1100  Weight: (!) 156 kg    Examination: General: Obese woman, ventilated and completely unresponsive HENT: ET tube in good position, Lungs: Very distant but clear bilaterally Cardiovascular: Distant, regular  without a murmur Abdomen: Obese and nondistended, no bowel sounds heard Extremities: No edema Neuro: Pupils 7 mm and nonreactive, completely unresponsive (she was given paralytic for intubation)  Resolved problem list   Assessment and Plan   Large intracerebral hemorrhage suspect due to hypertension, anticoagulation use -Appreciate neurosurgery and neurology management.  Considering EVD placement - Unfortunately neurological exam very concerning for severe injury, poor prognosis, possibly even brain death.  Depending on course we will have to consider brain death testing. - Mannitol  +/- 3% saline - Repeat imaging as per neurosurgery and neurology plans - Frequent neurochecks - Seizure precautions  Hypertension with hypertensive emergency - Tight blood pressure control, SBP 130-150 - Cleviprex  as ordered, titrate to the above  Acute respiratory failure and ventilator dependence due to encephalopathy - PRVC 8 cc/kg - Wean FiO2 as able.  Continue PEEP 8 given her obesity and hypoventilation - Sedation as per PAD protocol - Pulmonary hygiene - VAP prevention orders - Assess for SBT as she stabilizes  OSA not on CPAP - If she is able to progress neurologically and we can consider extubation then would likely need to extubate to BiPAP  DVT on Xarelto  outpatient - Xarelto  held and reversed with Kcentra  on arrival to the ED  Diabetes with hyperglycemia -Will initiate CBG, sliding scale insulin  protocol  Leukocytosis, question reactive versus infection -No clear evidence for active infection.  Follow WBC,  fever curve   Best Practice (right click and Reselect all SmartList Selections daily)   Diet/type: NPO DVT prophylaxis SCD Pressure ulcer(s): N/A GI prophylaxis: PPI Lines: Central line Foley:  Yes, and it is still needed Code Status:  full code Last date of multidisciplinary goals of care discussion [pending]  Labs   CBC: Recent Labs  Lab 08/31/23 0957  08/31/23 1004 08/31/23 1009  WBC 18.3*  --   --   NEUTROABS 14.3*  --   --   HGB 13.5 14.3 15.0  HCT 42.4 42.0 44.0  MCV 79.7*  --   --   PLT 375  --   --     Basic Metabolic Panel: Recent Labs  Lab 08/31/23 0957 08/31/23 1004 08/31/23 1009  NA 136 138 137  K 3.1* 3.1* 3.7  CL 100  --  102  CO2 21*  --   --   GLUCOSE 285*  --  300*  BUN 12  --  14  CREATININE 1.21*  --  1.10*  CALCIUM 8.9  --   --    GFR: Estimated Creatinine Clearance: 114.8 mL/min (A) (by C-G formula based on SCr of 1.1 mg/dL (H)). Recent Labs  Lab 08/31/23 0957 08/31/23 1004  WBC 18.3*  --   LATICACIDVEN  --  2.7*    Liver Function Tests: Recent Labs  Lab 08/31/23 0957  AST 29  ALT 21  ALKPHOS 56  BILITOT 0.9  PROT 7.7  ALBUMIN 3.6   No results for input(s): LIPASE, AMYLASE in the last 168 hours. Recent Labs  Lab 08/31/23 0957  AMMONIA 36*    ABG    Component Value Date/Time   HCO3 26.1 08/31/2023 1004   TCO2 24 08/31/2023 1009   O2SAT 90 08/31/2023 1004     Coagulation Profile: Recent Labs  Lab 08/31/23 0957  INR 1.3*    Cardiac Enzymes: No results for input(s): CKTOTAL, CKMB, CKMBINDEX, TROPONINI in the last 168 hours.  HbA1C: Hemoglobin A1C  Date/Time Value Ref Range Status  03/26/2019 04:24 PM 9.9 (A) 4.0 - 5.6 % Final   Hgb A1c MFr Bld  Date/Time Value Ref Range Status  12/12/2022 06:16 PM 8.5 (H) 4.8 - 5.6 % Final    Comment:    (NOTE) Pre diabetes:          5.7%-6.4%  Diabetes:              >6.4%  Glycemic control for   <7.0% adults with diabetes   02/03/2019 04:01 PM 10.5 (H) 4.8 - 5.6 % Final    Comment:             Prediabetes: 5.7 - 6.4          Diabetes: >6.4          Glycemic control for adults with diabetes: <7.0     CBG: Recent Labs  Lab 08/31/23 0947  GLUCAP 227*    Review of Systems:   Unable to obtain  Past Medical History:  She,  has a past medical history of Diabetes mellitus without complication (HCC), DVT  (deep venous thrombosis) (HCC) (03/2018), Gallstones, Headache(784.0), Hypertension, OSA (obstructive sleep apnea) (05/26/2020), and Sleep apnea.   Surgical History:   Past Surgical History:  Procedure Laterality Date   CHOLECYSTECTOMY N/A 03/12/2012   Procedure: LAPAROSCOPIC CHOLECYSTECTOMY;  Surgeon: Vicenta DELENA Poli, MD;  Location: [facility] OR;  Service: General;  Laterality: N/A;   HYSTEROSCOPY WITH D & C N/A 05/11/2020   Procedure: DILATATION  AND CURETTAGE /HYSTEROSCOPY;  Surgeon: Bettina Muskrat, MD;  Location: [facility] OR;  Service: Gynecology;  Laterality: N/A;   LAPAROSCOPY N/A 05/11/2020   Procedure: LAPAROSCOPY OPERATIVE; PELVIC WASHINGS;  Surgeon: Bettina Muskrat, MD;  Location: [facility] OR;  Service: Gynecology;  Laterality: N/A;  OVARIAN CYSTECTOMY     Social History:   reports that she quit smoking about 12 years ago. Her smoking use included cigarettes. She has never used smokeless tobacco. She reports current alcohol  use. She reports that she does not use drugs.   Family History:  Her family history includes Benign prostatic hyperplasia in her maternal grandfather; Brain cancer in her paternal grandmother; Breast cancer in her maternal grandmother; Diabetes in her maternal grandmother; Healthy in her mother; Hypertension in her maternal grandmother; Hypertension (age of onset: 46) in her son; Lung cancer in her paternal grandfather; Syncope episode in her maternal grandfather; Ulcerative colitis in her father. There is no history of Anesthesia problems, Hypotension, Malignant hyperthermia, Pseudochol deficiency, Colon cancer, Esophageal cancer, or Rectal cancer.   Allergies No Known Allergies   Home Medications  Prior to Admission medications   Medication Sig Start Date End Date Taking? Authorizing Provider  amLODipine -olmesartan  (AZOR ) 10-40 MG tablet Take 1 tablet by mouth daily. 12/15/22   Singh, Prashant K, MD  Biotin 89999 MCG TBDP Take 1 tablet by mouth daily.    [provider]   carvedilol  (COREG ) 3.125 MG tablet Take 1 tablet (3.125 mg total) by mouth 2 (two) times daily. Patient taking differently: Take 3.125 mg by mouth daily. 12/14/22 12/14/23  Singh, Prashant K, MD  CHLOROPHYLL PO Take 1 Dose by mouth daily.    [provider]  dapagliflozin  propanediol (FARXIGA ) 10 MG TABS tablet Take 1 tablet (10 mg total) by mouth daily before breakfast. 05/04/22   Sofia, Leslie K, PA-C  glucose blood (ONETOUCH VERIO) test strip 1 each by Other route 2 (two) times daily. And lancets 2/day 05/04/22   Flint Sonny POUR, PA-C  insulin  glargine (LANTUS  SOLOSTAR) 100 UNIT/ML Solostar Pen Inject 15 Units into the skin daily. 05/04/22   Flint Sonny POUR, PA-C  Lancets [facility] DELICA PLUS [place]) MISC 1 each by Other route as directed. 05/04/22   Sofia, Leslie K, PA-C  metFORMIN  (GLUCOPHAGE -XR) 500 MG 24 hr tablet Take 2 tablets (1,000 mg total) by mouth daily with breakfast. 05/04/22   Sofia, Leslie K, PA-C  Multiple Vitamins-Minerals (HAIR/SKIN/NAILS) TABS Take 1 tablet by mouth daily with breakfast.    [provider]  rivaroxaban  (XARELTO ) 10 MG TABS tablet Take 1 tablet (10 mg total) by mouth daily. 05/04/22   [place] Sonny POUR, PA-C     Critical care time: 35 min     Lamar Chris, MD, PhD 08/31/2023, 12:28 PM [facility] [phone] or if no answer before 7:00PM call [phone] For any issues after 7:00PM please call eLink [phone]

## 2023-08-31 NOTE — Progress Notes (Signed)
 CT angiography head and neck completed.  Poor flow through intracerebral vasculature, likely related to vasospasm or diminishing brain function progressing to brain death. Neurosurgeon had a discussion with the family were considering withdrawal. Will be available to continue to have conversations.

## 2023-09-01 ENCOUNTER — Inpatient Hospital Stay (HOSPITAL_COMMUNITY)

## 2023-09-01 DIAGNOSIS — I619 Nontraumatic intracerebral hemorrhage, unspecified: Secondary | ICD-10-CM | POA: Diagnosis not present

## 2023-09-01 DIAGNOSIS — R1310 Dysphagia, unspecified: Secondary | ICD-10-CM

## 2023-09-01 DIAGNOSIS — J9600 Acute respiratory failure, unspecified whether with hypoxia or hypercapnia: Secondary | ICD-10-CM | POA: Insufficient documentation

## 2023-09-01 MED ORDER — MORPHINE 100MG IN NS 100ML (1MG/ML) PREMIX INFUSION: 0.0000 mg/h | INTRAVENOUS | Status: DC

## 2023-09-01 MED ORDER — SODIUM CHLORIDE 0.9 % IV SOLN: INTRAVENOUS | Status: DC

## 2023-09-01 MED ORDER — MORPHINE 100MG IN NS 100ML (1MG/ML) PREMIX INFUSION
INTRAVENOUS | Status: DC
Start: 2023-09-01 — End: 2023-09-01
  Filled 2023-09-01: qty 100

## 2023-09-01 MED ORDER — MORPHINE BOLUS VIA INFUSION: 5.0000 mg | INTRAVENOUS | Status: DC | PRN

## 2023-09-24 NOTE — Procedures (Signed)
 Extubation Procedure Note  Patient Details:   Name: Whitney [name] DOB: September 04, 1985 MRN: [id number]   Airway Documentation:   Patient extubated per comfort care measures. RN and family at bedside.  [name] Sep 07, 2023, 1:49 PM

## 2023-09-24 NOTE — Progress Notes (Signed)
 Subjective: No acute events overnight  Objective: Vital signs in last 24 hours: Temp:  [95.9 F (35.5 C)-100.3 F (37.9 C)] 97.5 F (36.4 C) 2023-09-13 1000) Pulse Rate:  [66-139] 71 09/13/23 1000) Resp:  [20-27] 22 09/13/2023 1000) BP: (97-169)/(36-99) 127/87 Sep 13, 2023 1000) SpO2:  [96 %-100 %] 100 % 09-13-2023 1000) FiO2 (%):  [50 %-100 %] 50 % September 13, 2023 0747)  Intake/Output from previous day: 08/08 0701 - 2023-09-13 0700 In: 1555.8 [I.V.:25.7; IV Piggyback:1530.1] Out: 3215 [Urine:3015; Emesis/NG output:200] Intake/Output this shift: No intake/output data recorded.  Pupils 5 mm, nonreactive bilaterally.  No corneal or gag reflex.  No nonreflex movement to external stimulus.  Lab Results: Recent Labs    08/31/23 0957 08/31/23 1004 08/31/23 1009 08/31/23 1251  WBC 18.3*  --   --   --   HGB 13.5   < > 15.0 11.6*  HCT 42.4   < > 44.0 34.0*  PLT 375  --   --   --    < > = values in this interval not displayed.   BMET Recent Labs    08/31/23 0957 08/31/23 1004 08/31/23 1009 08/31/23 1251  NA 136   < > 137 138  K 3.1*   < > 3.7 2.9*  CL 100  --  102  --   CO2 21*  --   --   --   GLUCOSE 285*  --  300*  --   BUN 12  --  14  --   CREATININE 1.21*  --  1.10*  --   CALCIUM 8.9  --   --   --    < > = values in this interval not displayed.     Assessment/Plan: This is a 38 year old woman who was on Xarelto  for history of PE who suffered a devastating intracranial hemorrhage.  Exam continues to be consistent with brain death.  She is transitioning to comfort care  Whitney [name] 13-Sep-2023, 11:02 AM

## 2023-09-24 NOTE — Death Summary Note (Addendum)
 DEATH SUMMARY   Patient Details  Name: Whitney [name] MRN: [id number] DOB: 27-Jul-1985  Admission/Discharge Information   Admit Date:  2023/09/09  Date of Death: Date of Death: 2023-09-10  Time of Death: Time of Death: September 19, 1403  Length of Stay: 1  Referring Physician: Pcp, No   Reason(s) for Hospitalization  Large ICH, ICH score 4  Diagnoses  Preliminary cause of death:  Secondary Diagnoses (including complications and co-morbidities):  Principal Problem:   ICH (intracerebral hemorrhage) (HCC) Acute obstructive hydrocephalus Cytotoxic cerebral edema Tonsillar brain herniation Near brain death exam Active Problems:   Morbid obesity (HCC)   Obstructive sleep apnea syndrome   Hypertensive emergency   Uncontrolled type 2 diabetes mellitus with hyperglycemia, with long-term current use of insulin  (HCC)   History of pulmonary embolism   Chronic anticoagulation   History of DVT (deep vein thrombosis)   Acute respiratory failure [facility])   Dysphagia   Brief Hospital Course (including significant findings, care, treatment, and services provided and events leading to death)  Whitney [name] is a 38 y.o. year old female who  with a PMH diabetes, prior DVT on Xarelto , prior pulmonary embolism, hypertension presenting to the emergency department after being found unresponsive. Patient was last seen normal by family around 4 PM yesterday and then around midnight they know she got up to move laundry to the dryer. Around 0830 the patient's mother heard moaning from the patient's room, went in and found her on the floor unresponsive.  ICH score 4 CT head revealed Acute intraparenchymal hematoma centered within the left subinsular region, measuring up to 51 x 27 x 16 mm (11 mL), with intraventricular extension and casting of the ventricles; 2. Dilation of the entire ventricular system with periventricular hypoattenuation, consistent with acute hydrocephalus; 3. Sulcal effacement and effacement of the basilar  cisterns with likely early tonsillar herniation. While in the ED she was given mannitol  1mg /kg x 1, coagulopathy was reversed with Kcentra  per the EDP.  Neurosurgery was consulted.  Neurosurgery felt that a EVD would be of no benefit.  He was urgently intubated for airway protection.  She was also placed on a Cleviprex  drip to lower her blood pressure CTA head and neck revealed Limited contrast opacification of the intracranial arteries as above. On subsequent contrast injection there is improved opacification of the intracranial ICAs. Diminished contrast with small caliber cerebral arteries which may reflect severe vasospasm.  Family was informed of poor neurological exam and and discussed lack of intracranial blood flow on CTA, it was discussed that her neurological exam was very poor and close to brain death and there likely would be no meaningful neurological recovery.  Family had opted to pursue full comfort measures with compassionate extubation once all the family had arrived to say their goodbyes.  Patient was terminally extubated and expired at 1405 on 09/10/23.  Pertinent Labs and Studies  Significant Diagnostic Studies  EXAM: CTA HEAD AND NECK WITH 07/13/2022   TECHNIQUE: CTA of the head and neck was performed with the administration of intravenous contrast. Multiplanar 2D and/or 3D reformatted images are provided for review. Automated exposure control, iterative reconstruction, and/or weight based adjustment of the mA/kV was utilized to reduce the radiation dose to as low as reasonably achievable. Stenosis of the internal carotid arteries measured using NASCET criteria.   COMPARISON: Same day head CT.   CLINICAL HISTORY:   FINDINGS:   CTA NECK:   AORTIC ARCH AND ARCH VESSELS: No dissection or arterial injury. No  significant stenosis of the brachiocephalic or subclavian arteries.   CERVICAL CAROTID ARTERIES: The right carotid artery is patent in the neck. There is  mild tortuosity of the right cervical ICA. The left carotid artery is patent to the neck. There is mild tortuosity of the left cervical ICA. No dissection, arterial injury, or hemodynamically significant stenosis by NASCET criteria.   CERVICAL VERTEBRAL ARTERIES: The left vertebral artery is dominant. Slightly limited evaluation of the V1 and V2 segments of the left vertebral artery due to streak artifact. Visualized portions of the left vertebral artery are patent to the distal V3 segment. The nondominant right vertebral artery is not well visualized due to vessel caliber and artifact. No dissection, arterial injury, or significant stenosis.   LUNGS AND MEDIASTINUM: Patchy opacities in the bilateral upper lobes. Endotracheal tube and enteric tube partially visualized.   SOFT TISSUES: No acute abnormality.   BONES: Osseous structures without acute abnormality.   CTA HEAD:   ANTERIOR CIRCULATION: There is diminished intraluminal contrast within the intracranial internal carotid arteries, more pronounced on the right. There is progressive decreased attenuation within the intracranial internal carotid arteries with no contrast visualized within the right ICA beyond the supraclinoid segment. There is contrast visualized within the left ICA to the ICA terminus with faint contrast in the proximal left M1 segment. The right MCA is not visualized at all. There is significant tapering of the left M1 segment without additional left MCA branches visualized. ACA's are not well visualized. On a subsequent contrast run there is improved opacification of the internal carotid arteries which appear patent to the bilateral ICA termini. There is contrast noted within the bilateral M1 segments with diminished contrast within small caliber proximal M2 branches. There is contrast noted within the bilateral ACAs to the A3 segments. Contrast is noted within the left vertebral artery and basilar artery  with contrast within the diminutive appearing PCAs.   POSTERIOR CIRCULATION: There is no significant contrast opacification of the posterior circulation. No aneurysm.   OTHER: No dural venous sinus thrombosis on this non-dedicated study. Redemonstrated intracranial hemorrhage with intraventricular extension.   IMPRESSION: 1. Limited contrast opacification of the intracranial arteries as above. On subsequent contrast injection there is improved opacification of the intracranial ICAs. Diminished contrast with small caliber cerebral arteries which may reflect severe vasospasm. 2. Patent cervical carotid arteries. 3. Cervical left vertebral artery is patent. Limited visualization of non-dominant right vertebral artery. 4. Redemonstrated intracranial hemorrhage with intraventricular extension.  Microbiology Recent Results (from the past 240 hours)  MRSA Next Gen by PCR, Nasal     Status: None   Collection Time: 08/31/23 12:20 PM   Specimen: Nasal Mucosa; Nasal Swab  Result Value Ref Range Status   MRSA by PCR Next Gen NOT DETECTED NOT DETECTED Final    Comment: (NOTE) The GeneXpert MRSA Assay (FDA approved for NASAL specimens only), is one component of a comprehensive MRSA colonization surveillance program. It is not intended to diagnose MRSA infection nor to guide or monitor treatment for MRSA infections. Test performance is not FDA approved in patients less than 32 years old. Performed at [facility], [address] [place], [place] [address]     Lab Basic Metabolic Panel: Recent Labs  Lab 08/31/23 0957 08/31/23 1004 08/31/23 1009 08/31/23 1251  NA 136 138 137 138  K 3.1* 3.1* 3.7 2.9*  CL 100  --  102  --   CO2 21*  --   --   --  GLUCOSE 285*  --  300*  --   BUN 12  --  14  --   CREATININE 1.21*  --  1.10*  --   CALCIUM 8.9  --   --   --    Liver Function Tests: Recent Labs  Lab 08/31/23 0957  AST 29  ALT 21  ALKPHOS 56  BILITOT 0.9  PROT 7.7   ALBUMIN 3.6   No results for input(s): LIPASE, AMYLASE in the last 168 hours. Recent Labs  Lab 08/31/23 0957  AMMONIA 36*   CBC: Recent Labs  Lab 08/31/23 0957 08/31/23 1004 08/31/23 1009 08/31/23 1251  WBC 18.3*  --   --   --   NEUTROABS 14.3*  --   --   --   HGB 13.5 14.3 15.0 11.6*  HCT 42.4 42.0 44.0 34.0*  MCV 79.7*  --   --   --   PLT 375  --   --   --    Cardiac Enzymes: No results for input(s): CKTOTAL, CKMB, CKMBINDEX, TROPONINI in the last 168 hours. Sepsis Labs: Recent Labs  Lab 08/31/23 0957 08/31/23 1004  WBC 18.3*  --   LATICACIDVEN  --  2.7*     Denise A Wolfe 09-27-23, 3:09 PM    I personally spent a total of 50 minutes in the care of the patient today including getting/reviewing separately obtained history, performing a medically appropriate exam/evaluation, counseling and educating, placing orders, referring and communicating with other health care professionals, documenting clinical information in the EHR, independently interpreting results, and coordinating care.

## 2023-09-24 DEATH — deceased
